# Patient Record
Sex: Female | Born: 1990 | Race: White | Hispanic: No | Marital: Single | Smoking: Never smoker
Health system: Southern US, Community
[De-identification: ages and names within clinical notes are randomized; demographics above are authoritative.]

## PROBLEM LIST (undated history)

## (undated) ENCOUNTER — Emergency Department (HOSPITAL_COMMUNITY): Disposition: A | Discharge status: Home / Self Care | Payer: Medicaid Other

## (undated) DIAGNOSIS — K529 Noninfective gastroenteritis and colitis, unspecified: Secondary | ICD-10-CM

## (undated) DIAGNOSIS — G8929 Other chronic pain: Secondary | ICD-10-CM

## (undated) DIAGNOSIS — Z789 Other specified health status: Secondary | ICD-10-CM

## (undated) DIAGNOSIS — Z3009 Encounter for other general counseling and advice on contraception: Secondary | ICD-10-CM

## (undated) DIAGNOSIS — R109 Unspecified abdominal pain: Secondary | ICD-10-CM

## (undated) DIAGNOSIS — N926 Irregular menstruation, unspecified: Principal | ICD-10-CM

## (undated) HISTORY — PX: NO PAST SURGERIES: SHX2092

## (undated) HISTORY — DX: Irregular menstruation, unspecified: N92.6

## (undated) HISTORY — DX: Encounter for other general counseling and advice on contraception: Z30.09

---
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
## 2014-03-11 ENCOUNTER — Emergency Department (HOSPITAL_COMMUNITY): Payer: Managed Care, Other (non HMO)

## 2014-03-11 ENCOUNTER — Emergency Department (HOSPITAL_COMMUNITY)
Admission: EM | Admit: 2014-03-11 | Discharge: 2014-03-12 | Disposition: A | Discharge status: Home / Self Care | Payer: Managed Care, Other (non HMO) | Attending: Emergency Medicine | Admitting: Emergency Medicine

## 2014-03-11 ENCOUNTER — Encounter (HOSPITAL_COMMUNITY): Payer: Self-pay | Admitting: *Deleted

## 2014-03-11 DIAGNOSIS — R1011 Right upper quadrant pain: Secondary | ICD-10-CM | POA: Insufficient documentation

## 2014-03-11 DIAGNOSIS — Z3202 Encounter for pregnancy test, result negative: Secondary | ICD-10-CM | POA: Insufficient documentation

## 2014-03-11 DIAGNOSIS — R1031 Right lower quadrant pain: Secondary | ICD-10-CM | POA: Diagnosis not present

## 2014-03-11 DIAGNOSIS — R1084 Generalized abdominal pain: Secondary | ICD-10-CM | POA: Diagnosis present

## 2014-03-11 DIAGNOSIS — Z8719 Personal history of other diseases of the digestive system: Secondary | ICD-10-CM | POA: Insufficient documentation

## 2014-03-11 DIAGNOSIS — G8929 Other chronic pain: Secondary | ICD-10-CM

## 2014-03-11 DIAGNOSIS — Z79899 Other long term (current) drug therapy: Secondary | ICD-10-CM | POA: Insufficient documentation

## 2014-03-11 DIAGNOSIS — R109 Unspecified abdominal pain: Secondary | ICD-10-CM

## 2014-03-11 HISTORY — DX: Other chronic pain: G89.29

## 2014-03-11 HISTORY — DX: Noninfective gastroenteritis and colitis, unspecified: K52.9

## 2014-03-11 HISTORY — DX: Unspecified abdominal pain: R10.9

## 2014-03-11 LAB — CBC WITH DIFFERENTIAL/PLATELET
BASOS ABS: 0 10*3/uL (ref 0.0–0.1)
Basophils Relative: 0 % (ref 0–1)
Eosinophils Absolute: 0 10*3/uL (ref 0.0–0.7)
Eosinophils Relative: 0 % (ref 0–5)
HCT: 38.3 % (ref 36.0–46.0)
Hemoglobin: 12.7 g/dL (ref 12.0–15.0)
LYMPHS ABS: 1 10*3/uL (ref 0.7–4.0)
Lymphocytes Relative: 5 % — ABNORMAL LOW (ref 12–46)
MCH: 30.6 pg (ref 26.0–34.0)
MCHC: 33.2 g/dL (ref 30.0–36.0)
MCV: 92.3 fL (ref 78.0–100.0)
Monocytes Absolute: 1.6 10*3/uL — ABNORMAL HIGH (ref 0.1–1.0)
Monocytes Relative: 7 % (ref 3–12)
NEUTROS PCT: 88 % — AB (ref 43–77)
Neutro Abs: 18.9 10*3/uL — ABNORMAL HIGH (ref 1.7–7.7)
PLATELETS: 256 10*3/uL (ref 150–400)
RBC: 4.15 MIL/uL (ref 3.87–5.11)
RDW: 13.1 % (ref 11.5–15.5)
WBC: 21.4 10*3/uL — AB (ref 4.0–10.5)

## 2014-03-11 LAB — COMPREHENSIVE METABOLIC PANEL
ALBUMIN: 4.4 g/dL (ref 3.5–5.2)
ALT: 16 U/L (ref 0–35)
AST: 21 U/L (ref 0–37)
Alkaline Phosphatase: 61 U/L (ref 39–117)
Anion gap: 8 (ref 5–15)
BUN: 6 mg/dL (ref 6–23)
CO2: 22 mmol/L (ref 19–32)
CREATININE: 0.68 mg/dL (ref 0.50–1.10)
Calcium: 8.9 mg/dL (ref 8.4–10.5)
Chloride: 105 mmol/L (ref 96–112)
GFR calc Af Amer: >90 mL/min (ref >90)
Glucose, Bld: 90 mg/dL (ref 70–99)
Potassium: 3.5 mmol/L (ref 3.5–5.1)
SODIUM: 135 mmol/L (ref 135–145)
Total Bilirubin: 1 mg/dL (ref 0.3–1.2)
Total Protein: 7.7 g/dL (ref 6.0–8.3)

## 2014-03-11 LAB — URINALYSIS, ROUTINE W REFLEX MICROSCOPIC
Bilirubin Urine: NEGATIVE
GLUCOSE, UA: NEGATIVE mg/dL
Hgb urine dipstick: NEGATIVE
Ketones, ur: NEGATIVE mg/dL
LEUKOCYTES UA: NEGATIVE
Nitrite: NEGATIVE
PROTEIN: NEGATIVE mg/dL
SPECIFIC GRAVITY, URINE: 1.02 (ref 1.005–1.030)
Urobilinogen, UA: 0.2 mg/dL (ref 0.0–1.0)
pH: 7.5 (ref 5.0–8.0)

## 2014-03-11 LAB — PREGNANCY, URINE: Preg Test, Ur: NEGATIVE

## 2014-03-11 LAB — LIPASE, BLOOD: Lipase: 20 U/L (ref 11–59)

## 2014-03-11 LAB — RAPID STREP SCREEN (MED CTR MEBANE ONLY): STREPTOCOCCUS, GROUP A SCREEN (DIRECT): NEGATIVE

## 2014-03-11 MED ORDER — SODIUM CHLORIDE 0.9 % IV SOLN
INTRAVENOUS | Status: DC
  Administered 2014-03-11: 22:00:00 via INTRAVENOUS

## 2014-03-11 MED ORDER — IOHEXOL 300 MG/ML  SOLN
50.0000 mL | Freq: Once | INTRAMUSCULAR | Status: AC | PRN
  Administered 2014-03-11: 50 mL via ORAL

## 2014-03-11 MED ORDER — ONDANSETRON HCL 4 MG/2ML IJ SOLN
4.0000 mg | INTRAMUSCULAR | Status: DC | PRN
  Administered 2014-03-11: 4 mg via INTRAVENOUS
  Filled 2014-03-11: qty 2

## 2014-03-11 MED ORDER — SODIUM CHLORIDE 0.9 % IV BOLUS (SEPSIS)
1000.0000 mL | Freq: Once | INTRAVENOUS | Status: AC
  Administered 2014-03-12: 1000 mL via INTRAVENOUS

## 2014-03-11 MED ORDER — MORPHINE SULFATE 4 MG/ML IJ SOLN
4.0000 mg | INTRAMUSCULAR | Status: DC | PRN
  Administered 2014-03-11: 4 mg via INTRAVENOUS
  Filled 2014-03-11: qty 1

## 2014-03-11 MED ORDER — IOHEXOL 300 MG/ML  SOLN
100.0000 mL | Freq: Once | INTRAMUSCULAR | Status: AC | PRN
  Administered 2014-03-11: 100 mL via INTRAVENOUS

## 2014-03-11 NOTE — ED Notes (Signed)
Pt states right-sided abdominal pain for months. States pain has been worse for "a month or two" Pt states she thinks she has colitis. Pt has not been referred to GI, per pt. Pt also states sore throat began this morning.

## 2014-03-11 NOTE — ED Provider Notes (Signed)
CSN: [id number]     Arrival date & time 03/11/14  1702 History   First MD Initiated Contact with Patient 03/11/14 2056     Chief Complaint  Patient presents with  . Abdominal Pain     HPI Pt was seen at 2120.  Per pt, c/o gradual onset and persistence of constant generalized abd "pain" for the past 4 years, worse over the past 1 month. Describes the abd pain as "sharp." Pt states she was dx with colitis 4 years ago and she has had the pain since then. Pt states she has been evaluated by her PMD for same and was told to "eat right." Pt has not f/u with a GI MD as recommended by her PMD. Pt also c/o sore throat for the past several hours.  Denies N/V, no diarrhea, no fevers, no back pain, no rash, no CP/SOB, no black or blood in stools, no dysuria/hematuria.       Past Medical History  Diagnosis Date  . Colitis   . Chronic abdominal pain    History reviewed. No pertinent past surgical history.  History  Substance Use Topics  . Smoking status: Never Smoker   . Smokeless tobacco: Not on file  . Alcohol Use: No    Review of Systems ROS: Statement: All systems negative except as marked or noted in the HPI; Constitutional: Negative for fever and chills. ; ; Eyes: Negative for eye pain, redness and discharge. ; ; ENMT: Negative for ear pain, hoarseness, nasal congestion, sinus pressure and +sore throat. ; ; Cardiovascular: Negative for chest pain, palpitations, diaphoresis, dyspnea and peripheral edema. ; ; Respiratory: Negative for cough, wheezing and stridor. ; ; Gastrointestinal: +abd pain. Negative for nausea, vomiting, diarrhea, blood in stool, hematemesis, jaundice and rectal bleeding. . ; ; Genitourinary: Negative for dysuria, flank pain and hematuria. ; ; GYN:  No vaginal bleeding, no vaginal discharge, no vulvar pain. ;; Musculoskeletal: Negative for back pain and neck pain. Negative for swelling and trauma.; ; Skin: Negative for pruritus, rash, abrasions, blisters, bruising and skin  lesion.; ; Neuro: Negative for headache, lightheadedness and neck stiffness. Negative for weakness, altered level of consciousness , altered mental status, extremity weakness, paresthesias, involuntary movement, seizure and syncope.      Allergies  Mineral oil  Home Medications   Prior to Admission medications   Medication Sig Start Date End Date Taking? Authorizing Provider  ciprofloxacin (CIPRO) 500 MG tablet Take 500 mg by mouth 2 (two) times daily. 03/02/14  Yes Historical Provider, MD  [id number] 30-2-10-325 MG CAPS Take 2 capsules by mouth daily as needed (for pain).   Yes Historical Provider, MD   BP 122/83 mmHg  Pulse 110  Temp(Src) 99.2 F (37.3 C) (Oral)  Resp 18  Ht  (1.575 m)  Wt 145 lb (65.772 kg)  BMI 26.51 kg/m2  SpO2 100%  LMP 03/04/2014   Filed Vitals:   03/11/14 1956 03/11/14 2330 03/11/14 2332 03/12/14 0051  BP: 131/86 122/83  103/57  Pulse: 118  110 96  Temp: 99.2 F (37.3 C)   99.8 F (37.7 C)  TempSrc: Oral   Oral  Resp: 18   18  Height:      Weight:      SpO2: 98%  100% 100%    Physical Exam  2125: Physical examination:  Nursing notes reviewed; Vital signs and O2 SAT reviewed;  Constitutional: Well developed, Well nourished, Well hydrated, Uncomfortable appearing.; Head:  Normocephalic, atraumatic; Eyes: EOMI, PERRL, No scleral  icterus; ENMT: Mouth and pharynx normal, Mucous membranes moist. No hoarse voice, no drooling, no stridor.; Neck: Supple, Full range of motion, No lymphadenopathy; Cardiovascular: Regular rate and rhythm, No murmur, rub, or gallop; Respiratory: Breath sounds clear & equal bilaterally, No rales, rhonchi, wheezes.  Speaking full sentences with ease, Normal respiratory effort/excursion; Chest: Nontender, Movement normal; Abdomen: Soft, +very mild RUQ and RLQ tenderness to palp. No rebound or guarding. Nondistended, Normal bowel sounds; Genitourinary: No CVA tenderness; Extremities: Pulses normal, No tenderness, No  edema, No calf edema or asymmetry.; Neuro: AA&Ox3, Major CN grossly intact.  Speech clear. No gross focal motor or sensory deficits in extremities.; Skin: Color normal, Warm, Dry.     ED Course  Procedures     EKG Interpretation None      MDM  MDM Reviewed: nursing note, vitals and previous chart Reviewed previous: labs Interpretation: labs and CT scan     Results for orders placed or performed during the hospital encounter of 03/11/14  Rapid strep screen  Result Value Ref Range   Streptococcus, Group A Screen (Direct) NEGATIVE NEGATIVE  CBC WITH DIFFERENTIAL  Result Value Ref Range   WBC 21.4 (H) 4.0 - 10.5 K/uL   RBC 4.15 3.87 - 5.11 MIL/uL   Hemoglobin 12.7 12.0 - 15.0 g/dL   HCT 16.138.3 09.636.0 - 04.546.0 %   MCV 92.3 78.0 - 100.0 fL   MCH 30.6 26.0 - 34.0 pg   MCHC 33.2 30.0 - 36.0 g/dL   RDW 40.913.1 81.111.5 - 91.415.5 %   Platelets 256 150 - 400 K/uL   Neutrophils Relative % 88 (H) 43 - 77 %   Neutro Abs 18.9 (H) 1.7 - 7.7 K/uL   Lymphocytes Relative 5 (L) 12 - 46 %   Lymphs Abs 1.0 0.7 - 4.0 K/uL   Monocytes Relative 7 3 - 12 %   Monocytes Absolute 1.6 (H) 0.1 - 1.0 K/uL   Eosinophils Relative 0 0 - 5 %   Eosinophils Absolute 0.0 0.0 - 0.7 K/uL   Basophils Relative 0 0 - 1 %   Basophils Absolute 0.0 0.0 - 0.1 K/uL  Comprehensive metabolic panel  Result Value Ref Range   Sodium 135 135 - 145 mmol/L   Potassium 3.5 3.5 - 5.1 mmol/L   Chloride 105 96 - 112 mmol/L   CO2 22 19 - 32 mmol/L   Glucose, Bld 90 70 - 99 mg/dL   BUN 6 6 - 23 mg/dL   Creatinine, Ser 7.820.68 0.50 - 1.10 mg/dL   Calcium 8.9 8.4 - 95.610.5 mg/dL   Total Protein 7.7 6.0 - 8.3 g/dL   Albumin 4.4 3.5 - 5.2 g/dL   AST 21 0 - 37 U/L   ALT 16 0 - 35 U/L   Alkaline Phosphatase 61 39 - 117 U/L   Total Bilirubin 1.0 0.3 - 1.2 mg/dL   GFR calc non Af Amer >90 >90 mL/min   GFR calc Af Amer >90 >90 mL/min   Anion gap 8 5 - 15  Pregnancy, urine  Result Value Ref Range   Preg Test, Ur NEGATIVE NEGATIVE  Urinalysis  with microscopic  Result Value Ref Range   Color, Urine YELLOW YELLOW   APPearance CLEAR CLEAR   Specific Gravity, Urine 1.020 1.005 - 1.030   pH 7.5 5.0 - 8.0   Glucose, UA NEGATIVE NEGATIVE mg/dL   Hgb urine dipstick NEGATIVE NEGATIVE   Bilirubin Urine NEGATIVE NEGATIVE   Ketones, ur NEGATIVE NEGATIVE mg/dL   Protein,  ur NEGATIVE NEGATIVE mg/dL   Urobilinogen, UA 0.2 0.0 - 1.0 mg/dL   Nitrite NEGATIVE NEGATIVE   Leukocytes, UA NEGATIVE NEGATIVE  Lipase, blood  Result Value Ref Range   Lipase 20 11 - 59 U/L  CBC with Differential  Result Value Ref Range   WBC 17.3 (H) 4.0 - 10.5 K/uL   RBC 3.79 (L) 3.87 - 5.11 MIL/uL   Hemoglobin 11.8 (L) 12.0 - 15.0 g/dL   HCT 81.1 (L) 91.4 - 78.2 %   MCV 92.9 78.0 - 100.0 fL   MCH 31.1 26.0 - 34.0 pg   MCHC 33.5 30.0 - 36.0 g/dL   RDW 95.6 21.3 - 08.6 %   Platelets 219 150 - 400 K/uL   Neutrophils Relative % 83 (H) 43 - 77 %   Neutro Abs 14.4 (H) 1.7 - 7.7 K/uL   Lymphocytes Relative 9 (L) 12 - 46 %   Lymphs Abs 1.5 0.7 - 4.0 K/uL   Monocytes Relative 8 3 - 12 %   Monocytes Absolute 1.4 (H) 0.1 - 1.0 K/uL   Eosinophils Relative 0 0 - 5 %   Eosinophils Absolute 0.0 0.0 - 0.7 K/uL   Basophils Relative 0 0 - 1 %   Basophils Absolute 0.0 0.0 - 0.1 K/uL   Ct Abdomen Pelvis W Contrast 03/11/2014   CLINICAL DATA:  Worsening right-sided pain for 2 months.  EXAM: CT ABDOMEN AND PELVIS WITH CONTRAST  TECHNIQUE: Multidetector CT imaging of the abdomen and pelvis was performed using the standard protocol following bolus administration of intravenous contrast.  CONTRAST:  50mL OMNIPAQUE IOHEXOL 300 MG/ML SOLN, OMNIPAQUE IOHEXOL 300 MG/ML SOLN  COMPARISON:  10/04/2010  FINDINGS: Lower chest:  No significant abnormality  Hepatobiliary: There are normal appearances of the liver, gallbladder and bile ducts.  Pancreas: Normal  Spleen: Norm  Adrenals/Urinary Tract: The adrenals and kidneys are normal in appearance. There is no urinary calculus evident.  There is no hydronephrosis or ureteral dilatation. Collecting systems and ureters appear unremarkable.  Stomach/Bowel: The appendix is normal. Stomach, small bowel and colon are normal.  Vascular/Lymphatic: The abdominal aorta is normal in caliber. There is no atherosclerotic calcification. There is no adenopathy in the abdomen or pelvis.  Reproductive: Uterus and ovaries are normal.  Other: There is no ascites. No focal inflammatory changes are evident in the abdomen or pelvis. The changes of colitis observed in the 2012 have resolved.  Musculoskeletal: No significant abnormality  IMPRESSION: No significant abnormality   Electronically Signed   By: Ellery Plunk M.D.   On: 03/11/2014 23:43   Dg Chest 2 View 03/12/2014   CLINICAL DATA:  Right-sided abdominal pain for several months  EXAM: CHEST  2 VIEW  COMPARISON:  None.  FINDINGS: The heart size and mediastinal contours are within normal limits. Both lungs are clear. The visualized skeletal structures are unremarkable.  IMPRESSION: No active cardiopulmonary disease.   Electronically Signed   By: Ellery Plunk M.D.   On: 03/12/2014 00:24    0005:  WBC count elevated, but rest of workup is reassuring and without clear source for this. Will check CXR and repeat CBC after IV NS x1L.    0050:  CXR reassuring. WBC trending downward after IVF. Pt has tol PO well while in the ED without N/V.  No stooling while in the ED.  Abd benign, VSS/afebrile and per pt's baseline per EPIC chart review. Feels better and wants to go home now. Dx and testing d/w pt and  family.  Questions answered.  Verb understanding, agreeable to d/c home with outpt f/u.    Samuel Jester, DO 03/14/14 [phone]

## 2014-03-12 ENCOUNTER — Emergency Department (HOSPITAL_COMMUNITY): Payer: Managed Care, Other (non HMO)

## 2014-03-12 LAB — CBC WITH DIFFERENTIAL/PLATELET
BASOS ABS: 0 10*3/uL (ref 0.0–0.1)
Basophils Relative: 0 % (ref 0–1)
Eosinophils Absolute: 0 10*3/uL (ref 0.0–0.7)
Eosinophils Relative: 0 % (ref 0–5)
HCT: 35.2 % — ABNORMAL LOW (ref 36.0–46.0)
Hemoglobin: 11.8 g/dL — ABNORMAL LOW (ref 12.0–15.0)
LYMPHS PCT: 9 % — AB (ref 12–46)
Lymphs Abs: 1.5 10*3/uL (ref 0.7–4.0)
MCH: 31.1 pg (ref 26.0–34.0)
MCHC: 33.5 g/dL (ref 30.0–36.0)
MCV: 92.9 fL (ref 78.0–100.0)
Monocytes Absolute: 1.4 10*3/uL — ABNORMAL HIGH (ref 0.1–1.0)
Monocytes Relative: 8 % (ref 3–12)
NEUTROS ABS: 14.4 10*3/uL — AB (ref 1.7–7.7)
NEUTROS PCT: 83 % — AB (ref 43–77)
Platelets: 219 10*3/uL (ref 150–400)
RBC: 3.79 MIL/uL — AB (ref 3.87–5.11)
RDW: 13.2 % (ref 11.5–15.5)
WBC: 17.3 10*3/uL — AB (ref 4.0–10.5)

## 2014-03-12 MED ORDER — DICYCLOMINE HCL 20 MG PO TABS: 20.0000 mg | ORAL_TABLET | Freq: Four times a day (QID) | ORAL | Status: DC | PRN

## 2014-03-12 NOTE — Discharge Instructions (Signed)
°Emergency Department Resource Guide °1) Find a Doctor and Pay Out of Pocket °Although you won't have to find out who is covered by your insurance plan, it is a good idea to ask around and get recommendations. You will then need to call the office and see if the doctor you have chosen will accept you as a new patient and what types of options they offer for patients who are self-pay. Some doctors offer discounts or will set up payment plans for their patients who do not have insurance, but you will need to ask so you aren't surprised when you get to your appointment. ° °2) Contact Your Local Health Department °Not all health departments have doctors that can see patients for sick visits, but many do, so it is worth a call to see if yours does. If you don't know where your local health department is, you can check in your phone book. The CDC also has a tool to help you locate your state's health department, and many state websites also have listings of all of their local health departments. ° °3) Find a Walk-in Clinic °If your illness is not likely to be very severe or complicated, you may want to try a walk in clinic. These are popping up all over the country in pharmacies, drugstores, and shopping centers. They're usually staffed by nurse practitioners or physician assistants that have been trained to treat common illnesses and complaints. They're usually fairly quick and inexpensive. However, if you have serious medical issues or chronic medical problems, these are probably not your best option. ° °No Primary Care Doctor: °- Call Health Connect at  832-8000 - they can help you locate a primary care doctor that  accepts your insurance, provides certain services, etc. °- Physician Referral Service- 1-800-533-3463 ° °Chronic Pain Problems: °Organization         Address  Phone   Notes  °[facility]  (336) 297-2271 Patients need to be referred by their primary care doctor.  ° °Medication  Assistance: °Organization         Address  Phone   Notes  °[place] [facility] [address] °[place], [place] [address] (336) 641-8030 --Must be a resident of [place] °-- Must have NO insurance coverage whatsoever (no Medicaid/ Medicare, etc.) °-- The pt. MUST have a primary care doctor that directs their care regularly and follows them in the community °  °[facility]  (866) 331-1348   °[employer]  (888) 892-1162   ° °Agencies that provide inexpensive medical care: °Organization         Address  Phone   Notes  °[facility]  (336) 832-8035   °[facility]    (336) 832-7272   °[facility] [address] °[place], [place] [address] (336) 832-4777   °[facility] [address], °[place] (336) 271-4999   °[facility]    (336) 373-0678   °[facility]    (336) 272-1050   °[facility] ° [address], [place] Phone:  (336) 832-4444, Fax:  (336) 832-4440 Hours of Operation:  9 am - 6 pm, M-F.  Also accepts Medicaid/Medicare and self-pay.  °[facility] ° [address], [place] Phone: (336) [phone], Fax: (336) [phone]. Hours of Operation:  8:30 am - 5:30 pm, M-F.  Also accepts Medicaid and self-pay.  °[facility] [place] [address], [place] Phone: (336) 878-6027   °[facility] [address], [place], [place] (336)723-1848, Ext. 123 Mondays & Thursdays: 7-9 AM.  First 15 patients are seen on a first come, first serve basis. °  ° °Medicaid-accepting [place] Providers: ° °Organization         Address  Phone   Notes  °[facility] [address], [place] (336) 641-2100 Also accepts self-pay patients.  °[facility] [address], [place] ° (336) 856-9996   °[facility] [address], [place]  (336) 288-8857   °[facility] [address] [place] [address], [place] (336) 299-7000   °Veita Bland [address], [place]  ° (336) 373-1557 Only accepts [place] Access Medicaid patients after they have their name applied to their card.  ° °Self-Pay (no insurance) in [place]: ° °Organization         Address  Phone   Notes  °Sickle Cell Patients, [facility] [address], [place] (336) 832-1970   °[facility] [address], [place] (336) 832-4400   °[facility] ° [address], [place] (336) 992-4800   °[facility]/Dr. Osei-Bonsu ° [address] [place] [address], [place] or [address], [place] (336) [phone] Phone number for both [place] and [place] locations is the same.  °[facility] [address], [place] (336) 299-0000   °[facility] [address] [place] [address], [place] or [address] (336) 852-7530 °(336) 878-2260   °[facility] [address], [place] (336) 350-1642, phone; (336) 294-5005, fax Sees patients 1st and 3rd Saturday of every month.  Must not qualify for public or private insurance (i.e. Medicaid, Medicare, [place] Health Choice, Veterans' Benefits) • Household income should be no more than 200% of the poverty level •The clinic cannot treat you if you are pregnant or think you are pregnant • Sexually transmitted diseases are not treated at the clinic.  ° ° °Dental Care: °Organization         Address  Phone  Notes  °[place] [facility] [address], [place] (336) 641-6152 Accepts children up to age 21 who are enrolled in Medicaid or [place] Health Choice; pregnant women with a Medicaid card; and children who have applied for Medicaid or [place] Health Choice, but were declined, whose parents can pay a reduced fee at time of service.  °[place]  [facility] [place]  [address], [place] (336) 641-7733 Accepts children up to age 21 who are enrolled in Medicaid or [place] Health Choice; pregnant women with a Medicaid card; and children who have applied for Medicaid or [place] Health Choice, but were declined, whose parents can pay a reduced fee at time of service.  °[facility] ° [address], [place] (336) 641-4533 Patients are seen by appointment only. Walk-ins are not accepted. [facility] will see patients 18 years of age and older. °Monday - Tuesday (8am-5pm) °Most Wednesdays (8:30-5pm) °$30 per visit, cash only  °[facility] ° [address], [place] (336) 641-4533 Patients are seen by appointment only. Walk-ins are not accepted. [facility] will see patients 18 years of age and older. °One   Wednesday Evening (Monthly: Volunteer Based).  $30 per visit, cash only  °[facility]  (919) 537-3737 for adults; Children under age 4, call Graduate Pediatric Dentistry at ([phone]) [phone]. Children aged 4-14, please call ([phone]) [phone] to request a pediatric application. ° Dental services are provided in all areas of dental care including fillings, crowns and bridges, complete and partial dentures, implants, gum treatment, root canals, and extractions. Preventive care is also provided. Treatment is provided to both adults and children. °Patients are selected via a lottery and there is often a waiting list. °  °[facility] [address], °[place] ° (336) 763-8833 [email or web address] °  °[facility] [address], [place], [place] (336)723-1848, Ext. 123 Second and Fourth Thursday of each month, opens at 6:30 AM; Clinic ends at 9 AM.  Patients are seen on a first-come first-served basis, and a limited number are seen during each clinic.  ° °[facility] ° [address], [place], [place] (336) 723-7904    Eligibility Requirements °You must have lived in [place], [place], or [place] counties for at least the last three months. °  You cannot be eligible for state or federal sponsored healthcare insurance, including Veterans Administration, Medicaid, or Medicare. °  You generally cannot be eligible for healthcare insurance through your employer.  °  How to apply: °Eligibility screenings are held every Tuesday and Wednesday afternoon from 1:00 pm until 4:00 pm. You do not need an appointment for the interview!  °[facility] [address], [place], [place] 336-631-2330   °[facility]  336-342-8273   °[facility]  336-703-3100   °[facility]  336-570-6415   ° °Behavioral Health Resources in the Community: °Intensive Outpatient Programs °Organization         Address  Phone  Notes  °[facility] [address], [place], [place] 336-878-6098   °[facility] [address], [place], [place] 336-832-9800   °[facility] [address], [place], [place] ° 336-882-2125   °[facility] [address],  °[place], [place] 1-800-853-5163 or 336-641-4981   °Substance Abuse Resources °Organization         Address  Phone  Notes  °[facility]  336-882-2125   °[facility]  336-784-9470   °[facility]  336-285-9073   °[facility]  336-845-3988   °Residential & Outpatient Substance Abuse Program  1-800-659-3381   °Psychological Services °Organization         Address  Phone  Notes  °[facility]  336- 832-9600   °[facility]  336- 378-7881   °[facility] [address], [place] 1-800-853-5163 or 336-641-4981   ° °Mobile Crisis Teams °Organization         Address  Phone  Notes  °[facility], Mobile Crisis Care Unit  1-877-626-1772   °Assertive °[facility] ° [address]  [place], [place] 336-834-9664   °Sharon DeEsch [address] °[place] 336-554-5454   ° °Self-Help/Support Groups °Organization         Address  Phone             Notes  °[facility] of  - variety of support groups  336- 373-1402 Call for more information  °Narcotics Anonymous (NA), [facility] [address], °[place]  2 meetings at this location  ° °  Residential Treatment Programs Organization         Address  Phone  Notes  [facility] [address],    [place]  [phone]   [facility]  [address], [place] [phone], [place], [place] [phone]   [facility] [address], [place] [phone] Admissions: 8am-3pm M-F  [facility] [address]    [place], [place] [phone]   [facility] [address], [place], [place] [phone]   [facility] [address]  [place], [place] [phone]   [facility] Intensive Outpatient [address], [place], [place] [phone]   [facility] ([facility] [address]  [place], [place] [phone] or [phone]   [facility] ([facility] [address] [place], [place] [phone] Accepts Medicaid  [facility] [address]  [place] [phone])[phone] Substance Abuse/Addiction Treatment   [facility] Organization         Address  Phone  Notes  [facility]  531-490-7574(888) [phone]   Angie FavaJulie Brannon, PhD [address] [place], [place]   [phone](336) [phone] or ([phone](336) [phone]   [facility]   [address] [place], [place] [phone](336) [phone]   [facility] [address], [place], [place] [phone]) [phone](336) [phone] Insurance/Medicaid/sponsorship through [facility] [address]                                    [place], [place] [phone]) [phone] Therapy/tele-psych/case    [facility] [address]   [place], [place] [phone](336) [phone]    Dr. Lolly MustacheArfeen  [phone](336) [phone]   [facility]  [employer] [facility] 1) [address], [place] 2) [address], [place] 3)  [address] [address], [place] (336)[phone](336) [phone](336) [phone]  [phone](336) [phone]   [facility] Child Abuse Hotline 215-108-1651(336) 443-472-4085 or [phone](336) [phone] (After Hours)      Take the prescription as directed.  Call your regular medical doctor today to schedule a follow up appointment within the next 2 days. Call the GI doctor today to schedule a follow up appointment within the next week.  Return to the Emergency Department immediately sooner if worsening.

## 2014-03-12 NOTE — ED Notes (Signed)
Pt given water to drink. 

## 2014-03-14 LAB — CULTURE, GROUP A STREP: STREP A CULTURE: NEGATIVE

## 2014-04-01 ENCOUNTER — Encounter (INDEPENDENT_AMBULATORY_CARE_PROVIDER_SITE_OTHER): Payer: Self-pay | Admitting: *Deleted

## 2014-04-22 ENCOUNTER — Encounter (INDEPENDENT_AMBULATORY_CARE_PROVIDER_SITE_OTHER): Payer: Self-pay | Admitting: Internal Medicine

## 2014-04-22 ENCOUNTER — Ambulatory Visit (INDEPENDENT_AMBULATORY_CARE_PROVIDER_SITE_OTHER): Payer: Managed Care, Other (non HMO) | Admitting: Internal Medicine

## 2014-04-22 VITALS — BP 90/60 | HR 76 | Temp 98.1°F | Ht 64.0 in | Wt 144.0 lb

## 2014-04-22 DIAGNOSIS — R109 Unspecified abdominal pain: Secondary | ICD-10-CM | POA: Insufficient documentation

## 2014-04-22 DIAGNOSIS — R1030 Lower abdominal pain, unspecified: Secondary | ICD-10-CM

## 2014-04-22 LAB — CBC WITH DIFFERENTIAL/PLATELET
BASOS ABS: 0 10*3/uL (ref 0.0–0.1)
Basophils Relative: 0 % (ref 0–1)
EOS ABS: 0.3 10*3/uL (ref 0.0–0.7)
Eosinophils Relative: 4 % (ref 0–5)
HCT: 40.8 % (ref 36.0–46.0)
Hemoglobin: 13.7 g/dL (ref 12.0–15.0)
LYMPHS PCT: 25 % (ref 12–46)
Lymphs Abs: 2 10*3/uL (ref 0.7–4.0)
MCH: 30.5 pg (ref 26.0–34.0)
MCHC: 33.6 g/dL (ref 30.0–36.0)
MCV: 90.9 fL (ref 78.0–100.0)
MPV: 10.6 fL (ref 8.6–12.4)
Monocytes Absolute: 1.1 10*3/uL — ABNORMAL HIGH (ref 0.1–1.0)
Monocytes Relative: 14 % — ABNORMAL HIGH (ref 3–12)
Neutro Abs: 4.4 10*3/uL (ref 1.7–7.7)
Neutrophils Relative %: 57 % (ref 43–77)
Platelets: 343 10*3/uL (ref 150–400)
RBC: 4.49 MIL/uL (ref 3.87–5.11)
RDW: 13.7 % (ref 11.5–15.5)
WBC: 7.8 10*3/uL (ref 4.0–10.5)

## 2014-04-22 LAB — C-REACTIVE PROTEIN: CRP: <0.5 mg/dL (ref <0.60)

## 2014-04-22 NOTE — Progress Notes (Signed)
Subjective:    Patient ID: [name] Brown, female    DOB: 12/22/1990, 24 y.o.   MRN: [id number]  HPI 73 one year old female referred to our office by Terie Purser, PAC, for abdominal pain.  The pain is located in her rt lower abdomen.  She has had the pain for a couple of months.  There is no pain today. No fever associated with the pain.  She has this pain about once a week.  She usually takes nothing for the pain. She has been seen in the ED for this. Last visit was in February and was given Bentyl which she is not taking. Appetite is good.  No weight loss. She usually has a BM once a day. She saw blood on the tissue 2 weeks ago after having a BM. Stools are usually brown in color.  No hx of UC or Crohn's in her family. Last gyn exam 2015 and was normal at [facility].  There is no pain today.    LMP 04/05/2014. Sexually active. No birthcontrol.   03/10/2014 CT abdomen/pelvis with CM: Rt sided abdominal pain x 2 months  IMPRESSION: No significant abnormality     10/04/2010  Ct abdomen/pelvis with CM: IMPRESSION:  1.  Probable right colonic wall thickening, consistent with right- sided colitis.  Shotty right lower quadrant mesenteric lymph nodes and tiny amount of free fluid are likely reactive in etiology. 2.  No evidence of appendicitis.  No other significant abnormality identified.   CBC Latest Ref Rng 03/12/2014 03/11/2014 08/28/2011  WBC 4.0 - 10.5 K/uL 17.3(H) 21.4(H) 8.8  Hemoglobin 12.0 - 15.0 g/dL 11.8(L) 12.7 13.8  Hematocrit 36.0 - 46.0 % 35.2(L) 38.3 39.8  Platelets 150 - 400 K/uL 219 256 292        CBC    Component Value Date/Time   WBC 17.3* 03/12/2014 0034   RBC 3.79* 03/12/2014 0034   HGB 11.8* 03/12/2014 0034   HCT 35.2* 03/12/2014 0034   PLT 219 03/12/2014 0034   MCV 92.9 03/12/2014 0034   MCH 31.1 03/12/2014 0034   MCHC 33.5 03/12/2014 0034   RDW 13.2 03/12/2014 0034   LYMPHSABS 1.5 03/12/2014 0034   MONOABS 1.4* 03/12/2014 0034   EOSABS 0.0  03/12/2014 0034   BASOSABS 0.0 03/12/2014 0034   Hepatic Function Panel     Component Value Date/Time   PROT 7.7 03/11/2014 2026   ALBUMIN 4.4 03/11/2014 2026   AST 21 03/11/2014 2026   ALT 16 03/11/2014 2026   ALKPHOS 61 03/11/2014 2026   BILITOT 1.0 03/11/2014 2026   BILIDIR 0.2 12/29/2007 0420   IBILI 0.4 12/29/2007 0420    Urinalysis    Component Value Date/Time   COLORURINE YELLOW 03/11/2014 1817   APPEARANCEUR CLEAR 03/11/2014 1817   LABSPEC 1.020 03/11/2014 1817   PHURINE 7.5 03/11/2014 1817   GLUCOSEU NEGATIVE 03/11/2014 1817   HGBUR NEGATIVE 03/11/2014 1817   BILIRUBINUR NEGATIVE 03/11/2014 1817   KETONESUR NEGATIVE 03/11/2014 1817   PROTEINUR NEGATIVE 03/11/2014 1817   UROBILINOGEN 0.2 03/11/2014 1817   NITRITE NEGATIVE 03/11/2014 1817   LEUKOCYTESUR NEGATIVE 03/11/2014 1817   Lipase     Component Value Date/Time   LIPASE 20 03/11/2014 2059          Review of Systems Single, No children. Works at [employer].  Past Medical History  Diagnosis Date  . Colitis   . Chronic abdominal pain     No past surgical history on file.  Allergies  Allergen Reactions  .  Mineral Oil Rash    Baby oil    No current outpatient prescriptions on file prior to visit.   No current facility-administered medications on file prior to visit.        Objective:   Physical Exam Blood pressure 90/60, pulse 76, temperature 98.1 F (36.7 C), height 5\' 4"  (1.626 m), weight 144 lb (65.318 kg). Alert and oriented. Skin warm and dry. Oral mucosa is moist.   . Sclera anicteric, conjunctivae is pink. Thyroid not enlarged. No cervical lymphadenopathy. Lungs clear. Heart regular rate and rhythm.  Abdomen is soft. Bowel sounds are positive. No hepatomegaly. No abdominal masses felt. Slight tenderness rt lower abdomen.  No edema to lower extremities.  Rectal exam: no stool in rectum.      Assessment & Plan:  Rt lower quadrant pain ? Etiology. She has has this pain for several  years off an on . Am going to get a CBC and CRP, sed rate. I discussed with Dr. Karilyn Cotaehman. Once back I will review labs.. ?colonoscopy  I am going to send a hemocult card home with patient.

## 2014-04-22 NOTE — Patient Instructions (Addendum)
CBC, CRP, sedrate

## 2014-04-23 ENCOUNTER — Encounter (INDEPENDENT_AMBULATORY_CARE_PROVIDER_SITE_OTHER): Payer: Self-pay

## 2014-04-23 LAB — SEDIMENTATION RATE: Sed Rate: 1 mm/hr (ref 0–20)

## 2014-04-29 ENCOUNTER — Telehealth (INDEPENDENT_AMBULATORY_CARE_PROVIDER_SITE_OTHER): Payer: Self-pay | Admitting: *Deleted

## 2014-04-29 NOTE — Telephone Encounter (Signed)
[name] Brown, Patient needs OV on a Monday or Tuesday while Dr. Karilyn Cotaehman in the office in 4 weeks.

## 2014-04-29 NOTE — Telephone Encounter (Signed)
   Diagnosis:    Result(s)   Card 1:Negative:            Completed by: Larose Hiresammy Shirlee Whitmire, LPN   HEMOCCULT SENSA DEVELOPER: LOT#:  [id number] EXPIRATION DATE: 9-17   HEMOCCULT SENSA CARD:  LOT#:  02/[id number] EXPIRATION DATE: 07-18   CARD CONTROL RESULTS:  POSITIVE: Positive NEGATIVE: Negative    ADDITIONAL COMMENTS: Result forwarded to Terri to review.

## 2014-04-30 NOTE — Telephone Encounter (Signed)
LM for patient to return the call at [phone]. 

## 2014-05-01 NOTE — Telephone Encounter (Signed)
Apt has been scheduled for 06/01/14 with Terri while Dr. Karilyn Cotaehman is in office.

## 2014-05-04 ENCOUNTER — Telehealth (INDEPENDENT_AMBULATORY_CARE_PROVIDER_SITE_OTHER): Payer: Self-pay | Admitting: *Deleted

## 2014-05-04 NOTE — Telephone Encounter (Signed)
[name] Brown would like a call back about her labs and next step after speaking with Dr. Karilyn Cotaehman. Her return phone number is [phone].

## 2014-05-05 ENCOUNTER — Encounter (INDEPENDENT_AMBULATORY_CARE_PROVIDER_SITE_OTHER): Payer: Self-pay | Admitting: Internal Medicine

## 2014-05-05 NOTE — Telephone Encounter (Signed)
I have spoken with patient. Needs OV in 3 weeks on a M or T while he is in office. I discussed this case with Dr Karilyn Cotaehman

## 2014-05-05 NOTE — Telephone Encounter (Signed)
Apt has been scheduled for 05/19/14 at 9:30 am with Dorene Arerri Setzer, NP.

## 2014-05-19 ENCOUNTER — Ambulatory Visit (INDEPENDENT_AMBULATORY_CARE_PROVIDER_SITE_OTHER): Payer: Managed Care, Other (non HMO) | Admitting: Internal Medicine

## 2014-06-01 ENCOUNTER — Ambulatory Visit (INDEPENDENT_AMBULATORY_CARE_PROVIDER_SITE_OTHER): Payer: Managed Care, Other (non HMO) | Admitting: Internal Medicine

## 2014-06-20 ENCOUNTER — Emergency Department (HOSPITAL_COMMUNITY)
Admission: EM | Admit: 2014-06-20 | Discharge: 2014-06-20 | Disposition: A | Discharge status: Home / Self Care | Payer: Managed Care, Other (non HMO)

## 2014-06-26 ENCOUNTER — Encounter: Payer: Self-pay | Admitting: Adult Health

## 2014-06-26 ENCOUNTER — Ambulatory Visit (INDEPENDENT_AMBULATORY_CARE_PROVIDER_SITE_OTHER): Payer: Commercial Indemnity | Admitting: Adult Health

## 2014-06-26 VITALS — BP 100/60 | HR 72 | Ht 64.0 in | Wt 147.5 lb

## 2014-06-26 DIAGNOSIS — Z3202 Encounter for pregnancy test, result negative: Secondary | ICD-10-CM | POA: Diagnosis not present

## 2014-06-26 DIAGNOSIS — N926 Irregular menstruation, unspecified: Secondary | ICD-10-CM | POA: Diagnosis not present

## 2014-06-26 DIAGNOSIS — Z3009 Encounter for other general counseling and advice on contraception: Secondary | ICD-10-CM | POA: Insufficient documentation

## 2014-06-26 DIAGNOSIS — Z309 Encounter for contraceptive management, unspecified: Secondary | ICD-10-CM

## 2014-06-26 HISTORY — DX: Irregular menstruation, unspecified: N92.6

## 2014-06-26 HISTORY — DX: Encounter for other general counseling and advice on contraception: Z30.09

## 2014-06-26 LAB — POCT URINE PREGNANCY: Preg Test, Ur: NEGATIVE

## 2014-06-26 NOTE — Patient Instructions (Signed)
Oral Contraception Use Oral contraceptive pills (OCPs) are medicines taken to prevent pregnancy. OCPs work by preventing the ovaries from releasing eggs. The hormones in OCPs also cause the cervical mucus to thicken, preventing the sperm from entering the uterus. The hormones also cause the uterine lining to become thin, not allowing a fertilized egg to attach to the inside of the uterus. OCPs are highly effective when taken exactly as prescribed. However, OCPs do not prevent sexually transmitted diseases (STDs). Safe sex practices, such as using condoms along with an OCP, can help prevent STDs. Before taking OCPs, you may have a physical exam and Pap test. Your health care provider may also order blood tests if necessary. Your health care provider will make sure you are a good candidate for oral contraception. Discuss with your health care provider the possible side effects of the OCP you may be prescribed. When starting an OCP, it can take 2 to 3 months for the body to adjust to the changes in hormone levels in your body.  HOW TO TAKE ORAL CONTRACEPTIVE PILLS Your health care provider may advise you on how to start taking the first cycle of OCPs. Otherwise, you can:   Start on day 1 of your menstrual period. You will not need any backup contraceptive protection with this start time.   Start on the first Sunday after your menstrual period or the day you get your prescription. In these cases, you will need to use backup contraceptive protection for the first week.   Start the pill at any time of your cycle. If you take the pill within 5 days of the start of your period, you are protected against pregnancy right away. In this case, you will not need a backup form of birth control. If you start at any other time of your menstrual cycle, you will need to use another form of birth control for 7 days. If your OCP is the type called a minipill, it will protect you from pregnancy after taking it for 2 days (48  hours). After you have started taking OCPs:   If you forget to take 1 pill, take it as soon as you remember. Take the next pill at the regular time.   If you miss 2 or more pills, call your health care provider because different pills have different instructions for missed doses. Use backup birth control until your next menstrual period starts.   If you use a 28-day pack that contains inactive pills and you miss 1 of the last 7 pills (pills with no hormones), it will not matter. Throw away the rest of the non-hormone pills and start a new pill pack.  No matter which day you start the OCP, you will always start a new pack on that same day of the week. Have an extra pack of OCPs and a backup contraceptive method available in case you miss some pills or lose your OCP pack.  HOME CARE INSTRUCTIONS   Do not smoke.   Always use a condom to protect against STDs. OCPs do not protect against STDs.   Use a calendar to mark your menstrual period days.   Read the information and directions that came with your OCP. Talk to your health care provider if you have questions.  SEEK MEDICAL CARE IF:   You develop nausea and vomiting.   You have abnormal vaginal discharge or bleeding.   You develop a rash.   You miss your menstrual period.   You are losing   your hair.   You need treatment for mood swings or depression.   You get dizzy when taking the OCP.   You develop acne from taking the OCP.   You become pregnant.  SEEK IMMEDIATE MEDICAL CARE IF:   You develop chest pain.   You develop shortness of breath.   You have an uncontrolled or severe headache.   You develop numbness or slurred speech.   You develop visual problems.   You develop pain, redness, and swelling in the legs.  Document Released: 12/29/2010 Document Revised: 05/26/2013 Document Reviewed: 06/30/2012 [facility] Patient Information 2015 [employer] [place]. This information is not intended to replace  advice given to you by your health care provider. Make sure you discuss any questions you have with your health care provider. Will talk Monday, if [facility] negative will start lo loestrin Monday.

## 2014-06-26 NOTE — Progress Notes (Signed)
Subjective:     Patient ID: [name] Brown, female   DOB: 06/05/1990, 24 y.o.   MRN: [id number]  HPI [name] Brown is a [age] year old white female,single,G0P0, new to this practice, in complaining of having missed a period, she is 5 days late and has never done this before.She has new partner and uses condoms some.She wants to get on birth control.She has used OCs for short period in past and also depo but gained weight, thinks she wants low dose pill. [facility] and she said she had pap there earlier this year.  Review of Systems Patient denies any headaches, hearing loss, fatigue, blurred vision, shortness of breath, chest pain, problems with bowel movements, urination, or intercourse. No joint pain or mood swings.+missed period and she has chronic abdominal pain, has seen GI.  Reviewed past medical,surgical, social and family history. Reviewed medications and allergies.     Objective:   Physical Exam BP 100/60 mmHg  Pulse 72  Ht 5\' 4"  (1.626 m)  Wt 147 lb 8 oz (66.906 kg)  BMI 25.31 kg/m2  LMP 05/21/2014 UPT negative, Skin warm and dry.Pelvic: external genitalia is normal in appearance no lesions, vagina: pink discharge without odor,urethra has no lesions or masses noted, cervix:smooth,no CMT, uterus: normal size, shape and contour, non tender, no masses felt, adnexa: no masses or tenderness noted. Bladder is non tender and no masses felt. Abdomen soft, non tender, no HSM noted. GC/CHL obtained.    Discussed that she may be getting ready to start period,and that can start lo loestrin Monday [facility] and this pink discharge is start of her period.  Assessment:     Missed period Contraceptive counseling     Plan:    Encouraged to use condoms 100% Check QHCG, will talk Monday if negative will Rx Lo loestrin to start then GC/CHL sent Review handout on OC use   Follow up in 3 months

## 2014-06-27 LAB — BETA HCG QUANT (REF LAB): HCG QUANT: 2 m[IU]/mL

## 2014-06-28 LAB — GC/CHLAMYDIA PROBE AMP
Chlamydia trachomatis, NAA: NEGATIVE
Neisseria gonorrhoeae by PCR: NEGATIVE

## 2014-06-29 ENCOUNTER — Telehealth: Payer: Self-pay | Admitting: Adult Health

## 2014-06-29 NOTE — Telephone Encounter (Signed)
Left message to call.

## 2014-06-30 ENCOUNTER — Telehealth: Payer: Self-pay | Admitting: Adult Health

## 2014-06-30 DIAGNOSIS — N926 Irregular menstruation, unspecified: Secondary | ICD-10-CM

## 2014-06-30 NOTE — Telephone Encounter (Signed)
Left message to call about labs 

## 2014-06-30 NOTE — Telephone Encounter (Signed)
Pt aware of labs check [facility]

## 2014-07-01 ENCOUNTER — Telehealth: Payer: Self-pay | Admitting: Adult Health

## 2014-07-01 LAB — BETA HCG QUANT (REF LAB)

## 2014-07-01 MED ORDER — NORETHIN-ETH ESTRAD-FE BIPHAS 1 MG-10 MCG / 10 MCG PO TABS: 1.0000 | ORAL_TABLET | Freq: Every day | ORAL | Status: DC

## 2014-07-01 NOTE — Telephone Encounter (Signed)
Pt aware QHCG<1 wil rx lo loestrin and can start today

## 2014-09-29 ENCOUNTER — Ambulatory Visit: Payer: Commercial Indemnity | Admitting: Adult Health

## 2014-12-22 ENCOUNTER — Ambulatory Visit: Payer: Commercial Indemnity | Admitting: Adult Health

## 2014-12-22 ENCOUNTER — Ambulatory Visit (INDEPENDENT_AMBULATORY_CARE_PROVIDER_SITE_OTHER): Payer: Managed Care, Other (non HMO) | Admitting: Advanced Practice Midwife

## 2014-12-22 ENCOUNTER — Encounter: Payer: Self-pay | Admitting: Advanced Practice Midwife

## 2014-12-22 VITALS — BP 122/70 | HR 76 | Ht 62.0 in | Wt 145.5 lb

## 2014-12-22 DIAGNOSIS — A5901 Trichomonal vulvovaginitis: Secondary | ICD-10-CM | POA: Diagnosis not present

## 2014-12-22 DIAGNOSIS — B379 Candidiasis, unspecified: Secondary | ICD-10-CM

## 2014-12-22 MED ORDER — FLUCONAZOLE 150 MG PO TABS: ORAL_TABLET | ORAL | Status: DC

## 2014-12-22 NOTE — Patient Instructions (Signed)
Trichomoniasis Trichomoniasis is an infection caused by an organism called Trichomonas. The infection can affect both women and men. In women, the outer female genitalia and the vagina are affected. In men, the penis is mainly affected, but the prostate and other reproductive organs can also be involved. Trichomoniasis is a sexually transmitted infection (STI) and is most often passed to another person through sexual contact.  RISK FACTORS  Having unprotected sexual intercourse.  Having sexual intercourse with an infected partner. SIGNS AND SYMPTOMS  Symptoms of trichomoniasis in women include:  Abnormal gray-green frothy vaginal discharge.  Itching and irritation of the vagina.  Itching and irritation of the area outside the vagina. Symptoms of trichomoniasis in men include:   Penile discharge with or without pain.  Pain during urination. This results from inflammation of the urethra. DIAGNOSIS  Trichomoniasis may be found during a Pap test or physical exam. Your health care provider may use one of the following methods to help diagnose this infection:  Testing the pH of the vagina with a test tape.  Using a vaginal swab test that checks for the Trichomonas organism. A test is available that provides results within a few minutes.  Examining a urine sample.  Testing vaginal secretions. Your health care provider may test you for other STIs, including HIV. TREATMENT   You may be given medicine to fight the infection. Women should inform their health care provider if they could be or are pregnant. Some medicines used to treat the infection should not be taken during pregnancy.  Your health care provider may recommend over-the-counter medicines or creams to decrease itching or irritation.  Your sexual partner will need to be treated if infected.  Your health care provider may test you for infection again 3 months after treatment. HOME CARE INSTRUCTIONS   Take medicines only as  directed by your health care provider.  Take over-the-counter medicine for itching or irritation as directed by your health care provider.  Do not have sexual intercourse while you have the infection.  Women should not douche or wear tampons while they have the infection.  Discuss your infection with your partner. Your partner may have gotten the infection from you, or you may have gotten it from your partner.  Have your sex partner get examined and treated if necessary.  Practice safe, informed, and protected sex.  See your health care provider for other STI testing. SEEK MEDICAL CARE IF:   You still have symptoms after you finish your medicine.  You develop abdominal pain.  You have pain when you urinate.  You have bleeding after sexual intercourse.  You develop a rash.  Your medicine makes you sick or makes you throw up (vomit). MAKE SURE YOU:  Understand these instructions.  Will watch your condition.  Will get help right away if you are not doing well or get worse.   This information is not intended to replace advice given to you by your health care provider. Make sure you discuss any questions you have with your health care provider.   Document Released: 07/05/2000 Document Revised: 01/30/2014 Document Reviewed: 10/21/2012 Elsevier Interactive Patient Education 2016 Elsevier Inc.  

## 2014-12-22 NOTE — Progress Notes (Signed)
   [facility] Visit  Patient name: [name] Brown Royce MRN [id number]  Date of birth: 12/21/1990  CC & HPI:  [name] Brown Beber is a 24 y.o. Caucasian female presenting today for vaginal itch/irritation. She went to Urgent Care last weeks, was tx'd for PID with rocephin and doxycycline and flagy.  Still symptomatic  Pertinent History Reviewed:  Medical & Surgical Hx:   Past Medical History  Diagnosis Date  . Colitis   . Chronic abdominal pain   . Missed period 06/26/2014  . Counseling for birth control, oral contraceptives 06/26/2014   History reviewed. No pertinent past surgical history. Family History  Problem Relation Age of Onset  . Cancer Mother     cervical  . Alcohol abuse Mother   . Other Father     fatty liver  . Cancer Maternal Grandmother     skin  . Dementia Maternal Grandmother   . Dementia Paternal Grandmother   . Diabetes Paternal Grandfather   . Dementia Paternal Grandfather     Current outpatient prescriptions:  .  doxycycline (VIBRA-TABS) 100 MG tablet, 100 mg 2 (two) times daily. , Disp: , Rfl:  .  metroNIDAZOLE (FLAGYL) 500 MG tablet, 500 mg 2 (two) times daily. , Disp: , Rfl:  Social History: Reviewed -  reports that she has never smoked. She has never used smokeless tobacco.  Review of Systems:   Constitutional: Negative for fever and chills Eyes: Negative for visual disturbances Respiratory: Negative for shortness of breath, dyspnea Cardiovascular: Negative for chest pain or palpitations  Gastrointestinal: Negative for vomiting, diarrhea and constipation; no abdominal pain Genitourinary: Negative for dysuria and urgency, vaginal irritation or itching Musculoskeletal: Negative for back pain, joint pain, myalgias  Neurological: Negative for dizziness and headaches    Objective Findings:    Physical Examination: General appearance - well appearing, and in no distress Mental status - alert, oriented to person, place, and time Chest:  Normal  respiratory effort Heart - normal rate and regular rhythm Abdomen:  Soft, nontender Pelvic: SSE;  Yellow frothy dc with some white plaques on sidewalls.  Wet prep + trich, WBC, some yeast Musculoskeletal:  Normal range of motion without pain Extremities:  No edema    No results found for this or any previous visit (from the past 24 hour(s)).    Assessment & Plan:  A: trich/yeast    Finish flagyl.  No sex until boyfriend ttreated  Rx diflucan.    No Follow-up on file.  CRESENZO-DISHMAN,Zekiah Coen CNM 12/22/2014 3:09 PM

## 2014-12-31 ENCOUNTER — Encounter: Payer: Self-pay | Admitting: Obstetrics & Gynecology

## 2014-12-31 ENCOUNTER — Ambulatory Visit (INDEPENDENT_AMBULATORY_CARE_PROVIDER_SITE_OTHER): Payer: Managed Care, Other (non HMO) | Admitting: Obstetrics & Gynecology

## 2014-12-31 VITALS — BP 110/70 | HR 72 | Wt 145.0 lb

## 2014-12-31 DIAGNOSIS — N760 Acute vaginitis: Secondary | ICD-10-CM | POA: Diagnosis not present

## 2014-12-31 MED ORDER — METRONIDAZOLE 500 MG PO TABS: 500.0000 mg | ORAL_TABLET | Freq: Two times a day (BID) | ORAL | Status: DC

## 2014-12-31 MED ORDER — METRONIDAZOLE 0.75 % VA GEL: VAGINAL | Status: DC

## 2014-12-31 NOTE — Progress Notes (Signed)
Patient ID: [name] Brown, female   DOB: 05/19/1990, 24 y.o.   MRN: [id number]      Chief Complaint  Patient presents with  . Follow-up    proof of cure.    Blood pressure 110/70, pulse 72, weight 145 lb (65.772 kg), last menstrual period 12/05/2014.  24 y.o. G0P0000 Patient's last menstrual period was 12/05/2014. The current method of family planning is none.  Subjective Pt had + trichomonas treated still has bothersome discharge No odor some irritation  Objective NEFG Vagina pink moist with grey discharge, wet prep done Cervix no lesions  Pertinent ROS No burning with urination, frequency or urgency No nausea, vomiting or diarrhea Nor fever chills or other constitutional symptoms   Labs or studies Wet Prep:   A sample of vaginal discharge was obtained from the posterior fornix using a cotton swab. 2 drops of saline were placed on a slide and the cotton swab was immersed in the saline. Microscopic evaluation was performed and results were as follows:  Negative  for yeast  Negative for clue cells , consistent with Bacterial vaginosis Negative for trichomonas  Abnormal- very large WBC population   Whiff test: Negative     Impression Diagnoses this Encounter::   ICD-9-CM ICD-10-CM   1. Vaginitis 616.10 N76.0     Established relevant diagnosis(es): Recent history of trichomonas  Plan/Recommendations: Meds ordered this encounter  Medications  . metroNIDAZOLE (FLAGYL) 500 MG tablet    Sig: Take 1 tablet (500 mg total) by mouth 2 (two) times daily.    Dispense:  14 tablet    Refill:  0  . metroNIDAZOLE (METROGEL VAGINAL) 0.75 % vaginal gel    Sig: Nightly x 5 nights    Dispense:  70 g    Refill:  0    Labs or Scans Ordered: No orders of the defined types were placed in this encounter.    Management:: qohs for 10, oral flagyl for boyfriend  Follow up 1  months   All questions were answered.

## 2015-01-01 ENCOUNTER — Ambulatory Visit: Payer: Managed Care, Other (non HMO) | Admitting: Obstetrics & Gynecology

## 2015-01-04 ENCOUNTER — Ambulatory Visit: Payer: Managed Care, Other (non HMO) | Admitting: Women's Health

## 2015-01-26 ENCOUNTER — Emergency Department (HOSPITAL_COMMUNITY): Payer: 59

## 2015-01-26 ENCOUNTER — Encounter (HOSPITAL_COMMUNITY): Payer: Self-pay | Admitting: Emergency Medicine

## 2015-01-26 ENCOUNTER — Emergency Department (HOSPITAL_COMMUNITY)
Admission: EM | Admit: 2015-01-26 | Discharge: 2015-01-26 | Disposition: A | Discharge status: Home / Self Care | Payer: 59 | Attending: Emergency Medicine | Admitting: Emergency Medicine

## 2015-01-26 DIAGNOSIS — Z79899 Other long term (current) drug therapy: Secondary | ICD-10-CM | POA: Insufficient documentation

## 2015-01-26 DIAGNOSIS — Z8742 Personal history of other diseases of the female genital tract: Secondary | ICD-10-CM | POA: Insufficient documentation

## 2015-01-26 DIAGNOSIS — Z3202 Encounter for pregnancy test, result negative: Secondary | ICD-10-CM | POA: Insufficient documentation

## 2015-01-26 DIAGNOSIS — G8929 Other chronic pain: Secondary | ICD-10-CM | POA: Diagnosis not present

## 2015-01-26 DIAGNOSIS — R197 Diarrhea, unspecified: Secondary | ICD-10-CM | POA: Insufficient documentation

## 2015-01-26 DIAGNOSIS — R1084 Generalized abdominal pain: Secondary | ICD-10-CM | POA: Insufficient documentation

## 2015-01-26 DIAGNOSIS — R112 Nausea with vomiting, unspecified: Secondary | ICD-10-CM | POA: Insufficient documentation

## 2015-01-26 DIAGNOSIS — Z8719 Personal history of other diseases of the digestive system: Secondary | ICD-10-CM | POA: Insufficient documentation

## 2015-01-26 LAB — URINALYSIS, ROUTINE W REFLEX MICROSCOPIC
BILIRUBIN URINE: NEGATIVE
Glucose, UA: NEGATIVE mg/dL
Hgb urine dipstick: NEGATIVE
KETONES UR: 15 mg/dL — AB
Leukocytes, UA: NEGATIVE
NITRITE: NEGATIVE
Protein, ur: NEGATIVE mg/dL
Specific Gravity, Urine: 1.025 (ref 1.005–1.030)
pH: 5.5 (ref 5.0–8.0)

## 2015-01-26 LAB — COMPREHENSIVE METABOLIC PANEL
ALK PHOS: 60 U/L (ref 38–126)
ALT: 23 U/L (ref 14–54)
ANION GAP: 9 (ref 5–15)
AST: 21 U/L (ref 15–41)
Albumin: 4.6 g/dL (ref 3.5–5.0)
BILIRUBIN TOTAL: 0.8 mg/dL (ref 0.3–1.2)
BUN: 13 mg/dL (ref 6–20)
CALCIUM: 9.3 mg/dL (ref 8.9–10.3)
CO2: 23 mmol/L (ref 22–32)
Chloride: 106 mmol/L (ref 101–111)
Creatinine, Ser: 0.69 mg/dL (ref 0.44–1.00)
GFR calc non Af Amer: >60 mL/min (ref >60)
Glucose, Bld: 95 mg/dL (ref 65–99)
Potassium: 4.1 mmol/L (ref 3.5–5.1)
Sodium: 138 mmol/L (ref 135–145)
Total Protein: 8.1 g/dL (ref 6.5–8.1)

## 2015-01-26 LAB — CBC WITH DIFFERENTIAL/PLATELET
Basophils Absolute: 0 10*3/uL (ref 0.0–0.1)
Basophils Relative: 0 %
EOS ABS: 0.3 10*3/uL (ref 0.0–0.7)
Eosinophils Relative: 2 %
HEMATOCRIT: 44.5 % (ref 36.0–46.0)
HEMOGLOBIN: 15.4 g/dL — AB (ref 12.0–15.0)
LYMPHS ABS: 1 10*3/uL (ref 0.7–4.0)
Lymphocytes Relative: 6 %
MCH: 31.8 pg (ref 26.0–34.0)
MCHC: 34.6 g/dL (ref 30.0–36.0)
MCV: 91.8 fL (ref 78.0–100.0)
MONOS PCT: 7 %
Monocytes Absolute: 1.1 10*3/uL — ABNORMAL HIGH (ref 0.1–1.0)
NEUTROS PCT: 85 %
Neutro Abs: 14.6 10*3/uL — ABNORMAL HIGH (ref 1.7–7.7)
Platelets: 272 10*3/uL (ref 150–400)
RBC: 4.85 MIL/uL (ref 3.87–5.11)
RDW: 12.3 % (ref 11.5–15.5)
WBC: 17 10*3/uL — ABNORMAL HIGH (ref 4.0–10.5)

## 2015-01-26 LAB — HCG, SERUM, QUALITATIVE: PREG SERUM: NEGATIVE

## 2015-01-26 LAB — LIPASE, BLOOD: LIPASE: 24 U/L (ref 11–51)

## 2015-01-26 MED ORDER — DICYCLOMINE HCL 20 MG PO TABS: 20.0000 mg | ORAL_TABLET | Freq: Two times a day (BID) | ORAL | Status: DC

## 2015-01-26 MED ORDER — SODIUM CHLORIDE 0.9 % IV BOLUS (SEPSIS)
1000.0000 mL | Freq: Once | INTRAVENOUS | Status: AC
  Administered 2015-01-26: 1000 mL via INTRAVENOUS

## 2015-01-26 MED ORDER — ONDANSETRON HCL 4 MG/2ML IJ SOLN
4.0000 mg | Freq: Once | INTRAMUSCULAR | Status: AC
  Administered 2015-01-26: 4 mg via INTRAMUSCULAR
  Filled 2015-01-26: qty 2

## 2015-01-26 MED ORDER — MORPHINE SULFATE (PF) 2 MG/ML IV SOLN
4.0000 mg | Freq: Once | INTRAVENOUS | Status: AC
  Administered 2015-01-26: 4 mg via INTRAVENOUS
  Filled 2015-01-26: qty 2

## 2015-01-26 MED ORDER — IOHEXOL 300 MG/ML  SOLN
100.0000 mL | Freq: Once | INTRAMUSCULAR | Status: AC | PRN
  Administered 2015-01-26: 100 mL via INTRAVENOUS

## 2015-01-26 NOTE — ED Notes (Signed)
Pt ambulated to restroom independently.

## 2015-01-26 NOTE — ED Provider Notes (Signed)
CSN: [id number]     Arrival date & time 01/26/15  4098 History   First MD Initiated Contact with Patient 01/26/15 0725     Chief Complaint  Patient presents with  . Abdominal Pain     (Consider location/radiation/quality/duration/timing/severity/associated sxs/prior Treatment) HPI  25 year old female who presents with abdominal pain. Reports onset of pain two evenings ago. Pain generalized pain, associated with nausea, vomiting and diarrhea. No bloody or melenotic stools. No hematemesis. No sick contacts. No fevers or chills. No urinary symptoms. No recent travel. Denies recent antibiotics. No abnormal vaginal discharge or vaginal bleeding. No prior history of abdominal surgeries. Thinks this is similar to colitis that she has had several years ago. On chart review, recently treated for trichomonas and yeast in early December, which she states is now resolved.   Past Medical History  Diagnosis Date  . Colitis   . Chronic abdominal pain   . Missed period 06/26/2014  . Counseling for birth control, oral contraceptives 06/26/2014   Past Surgical History  Procedure Laterality Date  . Breast surgery     Family History  Problem Relation Age of Onset  . Cancer Mother     cervical  . Alcohol abuse Mother   . Other Father     fatty liver  . Cancer Maternal Grandmother     skin  . Dementia Maternal Grandmother   . Dementia Paternal Grandmother   . Diabetes Paternal Grandfather   . Dementia Paternal Grandfather    Social History  Substance Use Topics  . Smoking status: Never Smoker   . Smokeless tobacco: Never Used  . Alcohol Use: Yes     Comment: occ   OB History    Gravida Para Term Preterm AB TAB SAB Ectopic Multiple Living   0 0 0 0 0 0 0 0 0 0      Review of Systems 10/14 systems reviewed and are negative other than those stated in the HPI    Allergies  Mineral oil  Home Medications   Prior to Admission medications   Medication Sig Start Date End Date Taking?  Authorizing Provider  dicyclomine (BENTYL) 20 MG tablet Take 1 tablet (20 mg total) by mouth 2 (two) times daily. 01/26/15   Lavera Guise, MD   BP 113/65 mmHg  Pulse 98  Temp(Src) 98.2 F (36.8 C) (Oral)  Resp 18  Ht 5\' 2"  (1.575 m)  Wt 140 lb (63.504 kg)  BMI 25.60 kg/m2  SpO2 99%  LMP 01/05/2015 Physical Exam Physical Exam  Nursing note and vitals reviewed. Constitutional: Well developed, well nourished, non-toxic, and in no acute distress Head: Normocephalic and atraumatic.  Mouth/Throat: Oropharynx is clear and moist.  Neck: Normal range of motion. Neck supple.  Cardiovascular: Tachycardic rate and regular rhythm.   Pulmonary/Chest: Effort normal and breath sounds normal.  Abdominal: Soft. There is diffuse tenderness, worst in epigastric and lower abdomen. No CVA tenderness. There is no rebound and no guarding.  Musculoskeletal: Normal range of motion.  Neurological: Alert, no facial droop, fluent speech, moves all extremities symmetrically Skin: Skin is warm and dry.  Psychiatric: Cooperative  ED Course  Procedures (including critical care time) Labs Review Labs Reviewed  CBC WITH DIFFERENTIAL/PLATELET - Abnormal; Notable for the following:    WBC 17.0 (*)    Hemoglobin 15.4 (*)    Neutro Abs 14.6 (*)    Monocytes Absolute 1.1 (*)    All other components within normal limits  URINALYSIS, ROUTINE W REFLEX MICROSCOPIC (  NOT AT [facility]) - Abnormal; Notable for the following:    Ketones, ur 15 (*)    All other components within normal limits  COMPREHENSIVE METABOLIC PANEL  LIPASE, BLOOD  HCG, SERUM, QUALITATIVE    Imaging Review Ct Abdomen Pelvis W Contrast  01/26/2015  CLINICAL DATA:  Lower abdominal pain since yesterday with nausea, vomiting, and diarrhea. History of colitis. EXAM: CT ABDOMEN AND PELVIS WITH CONTRAST TECHNIQUE: Multidetector CT imaging of the abdomen and pelvis was performed using the standard protocol following bolus administration of intravenous  contrast. CONTRAST:  100mL OMNIPAQUE IOHEXOL 300 MG/ML  SOLN COMPARISON:  03/11/2014 FINDINGS: The visualized lung bases are clear. The liver, gallbladder, spleen, adrenal glands, kidneys, and pancreas have an unremarkable enhanced appearance. There is no evidence of bowel obstruction or inflammation. The appendix is identified in the right lower quadrant and is unremarkable. Small right lower quadrant mesenteric lymph nodes measure up to 6-7 mm in short axis, at most minimally more prominent than on the prior study and significantly smaller than in 2012. There is a small amount of free fluid in the pelvis. A 2.1 cm crenulated lesion with avid peripheral enhancement in the right adnexa likely reflects a corpus luteum. The uterus and left ovary are unremarkable. The bladder is unremarkable. The osseous structures are unremarkable. IMPRESSION: 1. Right ovarian corpus luteum with small volume pelvic free fluid. 2. Otherwise unremarkable examination. Electronically Signed   By: Sebastian AcheAllen  Grady M.D.   On: 01/26/2015 09:50   I have personally reviewed and evaluated these images and lab results as part of my medical decision-making.   EKG Interpretation None      MDM   Final diagnoses:  Generalized abdominal pain    In short this is a 25 year old female with prior history of colitis who presents to emergency department with 2 days of generalized abdominal pain associated with nausea, vomiting, and diarrhea. She is tachycardic on arrival, but afebrile and normotensive. The soft and nonsurgical abdomen, with diffuse tenderness primarily in epigastrium and periumbilical and low abdomen. Blood work with leukocytosis of 17. No other major metabolic or electrolyte issues. She is not pregnant.unremarkable lipase and hepatic function panel. Given her leukocytosis and pain which she states is not significantly improved with morphine, CT abdomen pelvis is performed. No acute intra-abdominal processes noted. At this time,  I do not feel that there is a serious or life-threatening intra-abdominal process that is causing her pain. Given prescription for Bentyl and discussed supportive management for home. Strict return and follow-up instructions reviewed. She expressed understanding of all discharge instructions and felt comfortable with the plan of care.   Lavera Guiseana Duo Liu, MD 01/26/15 [id number]

## 2015-01-26 NOTE — Discharge Instructions (Signed)
Return for worsening symptoms, including fever, vomiting and unable to keep down food/fluids, or any other symptoms concerning to you.  Abdominal Pain, Adult Many things can cause abdominal pain. Usually, abdominal pain is not caused by a disease and will improve without treatment. It can often be observed and treated at home. Your health care provider will do a physical exam and possibly order blood tests and X-rays to help determine the seriousness of your pain. However, in many cases, more time must pass before a clear cause of the pain can be found. Before that point, your health care provider may not know if you need more testing or further treatment. HOME CARE INSTRUCTIONS Monitor your abdominal pain for any changes. The following actions may help to alleviate any discomfort you are experiencing:  Only take over-the-counter or prescription medicines as directed by your health care provider.  Do not take laxatives unless directed to do so by your health care provider.  Try a clear liquid diet (broth, tea, or water) as directed by your health care provider. Slowly move to a bland diet as tolerated. SEEK MEDICAL CARE IF:  You have unexplained abdominal pain.  You have abdominal pain associated with nausea or diarrhea.  You have pain when you urinate or have a bowel movement.  You experience abdominal pain that wakes you in the night.  You have abdominal pain that is worsened or improved by eating food.  You have abdominal pain that is worsened with eating fatty foods.  You have a fever. SEEK IMMEDIATE MEDICAL CARE IF:  Your pain does not go away within 2 hours.  You keep throwing up (vomiting).  Your pain is felt only in portions of the abdomen, such as the right side or the left lower portion of the abdomen.  You pass bloody or black tarry stools. MAKE SURE YOU:  Understand these instructions.  Will watch your condition.  Will get help right away if you are not doing  well or get worse.   This information is not intended to replace advice given to you by your health care provider. Make sure you discuss any questions you have with your health care provider.   Document Released: 10/19/2004 Document Revised: 09/30/2014 Document Reviewed: 09/18/2012 Elsevier Interactive Patient Education [employer]2016 Elsevier Inc.

## 2015-01-26 NOTE — ED Notes (Signed)
Having abdominal pain since yesterday  Rates pain 10/10.

## 2015-01-29 ENCOUNTER — Ambulatory Visit (INDEPENDENT_AMBULATORY_CARE_PROVIDER_SITE_OTHER): Payer: 59 | Admitting: Obstetrics and Gynecology

## 2015-01-29 ENCOUNTER — Encounter: Payer: Self-pay | Admitting: Obstetrics and Gynecology

## 2015-01-29 VITALS — BP 120/72 | Ht 62.0 in | Wt 138.0 lb

## 2015-01-29 DIAGNOSIS — N760 Acute vaginitis: Secondary | ICD-10-CM | POA: Insufficient documentation

## 2015-01-29 MED ORDER — METRONIDAZOLE 0.75 % VA GEL: 1.0000 | Freq: Every day | VAGINAL | Status: DC

## 2015-01-29 NOTE — Progress Notes (Signed)
Patient ID: [name] Brown, female   DOB: 05/31/1990, 25 y.o.   MRN: [id number]   [facility]  Patient name: [name] Brown MRN [id number]  Date of birth: 08/13/1990  CC & HPI:  [name] Brown is a 25 y.o. female presenting today for follow up of vaginitis dx. Pt was seen in the office for her symptoms on 12/31/14 and was prescribed Metrogel. She states she used it every other night with mild to moderate relief. Pt reports she is still having intermittent, non-odorous, cream colored vaginal discharge, vaginal irritation and itching. LMP 01/05/15. Pt notes her symptoms were worsened before her period a month ago, but not worsened with her most recent period. Pt states she has not been using tampons and has not tried any new medications in the last week. Pt states she is sexually active and has been with the same partner for 4 months. She states she has been tested for GC/CHL since being with this partner.   ROS:  A complete 10 system review of systems was obtained and all systems are negative except as noted in the HPI and PMH.    Pertinent History Reviewed:   Reviewed: Significant for vaginitis, trichomoniasis  Medical         Past Medical History  Diagnosis Date  . Colitis   . Chronic abdominal pain   . Missed period 06/26/2014  . Counseling for birth control, oral contraceptives 06/26/2014                              Surgical Hx:    Past Surgical History  Procedure Laterality Date  . Breast surgery     Medications: Reviewed & Updated - see associated section                       Current outpatient prescriptions:  .  dicyclomine (BENTYL) 20 MG tablet, Take 1 tablet (20 mg total) by mouth 2 (two) times daily. (Patient not taking: Reported on 01/29/2015), Disp: 20 tablet, Rfl: 0   Social History: Reviewed -  reports that she has never smoked. She has never used smokeless tobacco.  Objective Findings:  Vitals: Blood pressure 120/72, height 5\' 2"  (1.575 m), weight 138 lb (62.596  kg), last menstrual period 01/05/2015.  Physical Examination: General appearance - alert, well appearing, and in no distress Mental status - alert, oriented to person, place, and time Abdomen - soft, nontender, nondistended, no masses or organomegaly Pelvic - normal external genitalia, vulva, vagina, cervix, uterus and adnexa,  VULVA: normal appearing vulva with no masses, tenderness or lesions, Skin tag on the right side of the labia majora.  VAGINA: normal appearing vagina with normal color, no lesions, Heavy cream colored discharge present  CERVIX: normal appearing cervix without discharge or lesions. No cervical irritation    Assessment & Plan:   A:  1. Vaginitis dx on 12/31/14, mild to moderate improvement with Metrogel 2. Continued vaginal discharge, vaginal irritation and itching 3. Wet prep and KOH obtained 4. Wet prep positive for greater than 20 WBC/hyperfeel HPF, rare clue cells, normal epithelial cells. Normal bacteria.  5. KOH negative for yeast and whiff   P:  1. Will prescribe 2-3 refills of Metrogel to use ONCE WEEKLY X 6 WK,  refil x3 2. Follow up in 6 weeks for recheck    By signing my name below, I, Doreatha MartinEva Mathews, attest that this  documentation has been prepared under the direction and in the presence of Tilda Burrow, MD. Electronically Signed: Doreatha Martin, ED Scribe. 01/29/2015. 9:27 AM.  I personally performed the services described in this documentation, which was SCRIBED in my presence. The recorded information has been reviewed and considered accurate. It has been edited as necessary during review. Tilda Burrow, MD

## 2015-01-29 NOTE — Patient Instructions (Signed)
Metronidazole vaginal gel What is this medicine? METRONIDAZOLE (me troe NI da zole) VAGINAL GEL is an antiinfective. It is used to treat bacterial vaginitis. This medicine may be used for other purposes; ask your health care provider or pharmacist if you have questions. What should I tell my health care provider before I take this medicine? They need to know if you have any of these conditions: -if you drink alcohol containing drinks -if you have taken disulfiram in the past two weeks -liver disease -peripheral neuropathy -seizures -an unusual or allergic reaction to metronidazole, parabens, nitroimidazoles, or other medicines, foods, dyes, or preservatives -pregnant or trying to get pregnant -breast-feeding How should I use this medicine? This medicine is only for use in the vagina. Do not take by mouth or apply to other areas of the body. Follow the directions on the prescription label. Wash hands before and after use. Screw the applicator to the tube and squeeze the tube gently to fill the applicator. Lie on your back, part and bend your knees. Insert the applicator tip high in the vagina and push the plunger to release the gel into the vagina. Gently remove the applicator. Wash the applicator well with warm water and soap. Use at regular intervals. Finish the full course prescribed by your doctor or health care professional even if you think your condition is better. Do not stop using except on the advice of your doctor or health care professional. Talk to your pediatrician regarding the use of this medicine in children. Special care may be needed. Overdosage: If you think you have taken too much of this medicine contact a poison control center or emergency room at once. NOTE: This medicine is only for you. Do not share this medicine with others. What if I miss a dose? If you miss a dose, use it as soon as you can. If it is almost time for your next dose, use only that dose. Do not use double  or extra doses. What may interact with this medicine? Do not take this medicine with any of the following medications: -alcohol or any product that contains alcohol -cisapride -dofetilide -dronedarone -pimozide -thioridazine -ziprasidone This medicine may also interact with the following medications: -cimetidine -lithium -other medicines that prolong the QT interval (cause an abnormal heart rhythm) -warfarin This list may not describe all possible interactions. Give your health care provider a list of all the medicines, herbs, non-prescription drugs, or dietary supplements you use. Also tell them if you smoke, drink alcohol, or use illegal drugs. Some items may interact with your medicine. What should I watch for while using this medicine? Tell your doctor or health care professional if your symptoms do not start to get better in 2 or 3 days. Avoid alcoholic drinks while you are taking this medicine and for three days afterwards. Alcohol may make you feel dizzy, sick, or flushed. You may get drowsy or dizzy. Do not drive, use machinery, or do anything that needs mental alertness until you know how this medicine affects you. To reduce the risk of dizzy or fainting spells, do not sit or stand up quickly, especially if you are an older patient. Your clothing may get soiled if you have a vaginal discharge. You can wear a sanitary napkin. Do not use tampons. Wear freshly washed cotton, not synthetic, panties. Do not have sex until you have finished your treatment. Having sex can make the treatment less effective. Your sexual partner may also need treatment. What side effects may I   notice from receiving this medicine? Side effects that you should report to your doctor or health care professional as soon as possible: -dizziness -frequent passing of urine -headache -loss of appetite -nausea -skin rash, itching -stomach pain or cramps -vaginal irritation or discharge -vulvar burning or  swelling Side effects that usually do not require medical attention (report to your doctor or health care professional if they continue or are bothersome): -dark urine -mild vaginal burning This list may not describe all possible side effects. Call your doctor for medical advice about side effects. You may report side effects to FDA at 1-800-FDA-1088. Where should I keep my medicine? Keep out of the reach of children. Store at room temperature between 15 and 30 degrees C (59 and 86 degrees F). Do not freeze. Throw away any unused medicine after the expiration date. NOTE: This sheet is a summary. It may not cover all possible information. If you have questions about this medicine, talk to your doctor, pharmacist, or health care provider.    2016, [employer]vier/[employer]. (2012-08-16 14:09:23)  

## 2015-01-29 NOTE — Progress Notes (Signed)
Patient ID: [name] Brown, female   DOB: 01/11/1991, 25 y.o.   MRN: [id number] Pt here today for follow up. Pt was treated for vaginitis with metrogel and states things got a little better, but she is still very irritated down there.

## 2015-02-05 ENCOUNTER — Ambulatory Visit: Payer: Managed Care, Other (non HMO) | Admitting: Obstetrics & Gynecology

## 2015-03-05 ENCOUNTER — Ambulatory Visit: Payer: Managed Care, Other (non HMO) | Admitting: Obstetrics & Gynecology

## 2015-03-12 ENCOUNTER — Ambulatory Visit: Payer: Managed Care, Other (non HMO) | Admitting: Obstetrics and Gynecology

## 2015-09-07 ENCOUNTER — Ambulatory Visit: Payer: 59 | Admitting: Obstetrics & Gynecology

## 2015-10-12 ENCOUNTER — Ambulatory Visit (INDEPENDENT_AMBULATORY_CARE_PROVIDER_SITE_OTHER): Payer: Self-pay | Admitting: Women's Health

## 2015-10-12 ENCOUNTER — Encounter: Payer: Self-pay | Admitting: Women's Health

## 2015-10-12 ENCOUNTER — Other Ambulatory Visit: Payer: Self-pay | Admitting: Women's Health

## 2015-10-12 VITALS — BP 112/68 | HR 82 | Wt 145.2 lb

## 2015-10-12 DIAGNOSIS — L292 Pruritus vulvae: Secondary | ICD-10-CM

## 2015-10-12 DIAGNOSIS — Z3202 Encounter for pregnancy test, result negative: Secondary | ICD-10-CM

## 2015-10-12 DIAGNOSIS — B3731 Acute candidiasis of vulva and vagina: Secondary | ICD-10-CM

## 2015-10-12 DIAGNOSIS — B373 Candidiasis of vulva and vagina: Secondary | ICD-10-CM

## 2015-10-12 DIAGNOSIS — N390 Urinary tract infection, site not specified: Secondary | ICD-10-CM

## 2015-10-12 DIAGNOSIS — R300 Dysuria: Secondary | ICD-10-CM

## 2015-10-12 LAB — POCT WET PREP (WET MOUNT)
CLUE CELLS WET PREP WHIFF POC: NEGATIVE
TRICHOMONAS WET PREP HPF POC: ABSENT

## 2015-10-12 LAB — POCT URINALYSIS DIPSTICK
Glucose, UA: NEGATIVE
Ketones, UA: NEGATIVE
NITRITE UA: NEGATIVE
PROTEIN UA: NEGATIVE

## 2015-10-12 LAB — POCT URINE PREGNANCY: PREG TEST UR: NEGATIVE

## 2015-10-12 MED ORDER — SULFAMETHOXAZOLE-TRIMETHOPRIM 800-160 MG PO TABS: 1.0000 | ORAL_TABLET | Freq: Two times a day (BID) | ORAL | 0 refills | Status: DC

## 2015-10-12 MED ORDER — FLUCONAZOLE 150 MG PO TABS: 150.0000 mg | ORAL_TABLET | Freq: Once | ORAL | 0 refills | Status: AC

## 2015-10-12 NOTE — Patient Instructions (Signed)
Start taking prenatal vitamins

## 2015-10-12 NOTE — Progress Notes (Signed)
   [facility] Visit  Patient name: [name] Brown MRN [id number]  Date of birth: 01/24/1990  CC & HPI:  [name] Brown is a 25 y.o. G0P0000 Caucasian female presenting today for report of strong odor to urine, urinary frequency and hesitancy for about 1 week. Denies fever/chills. Also reports lower back pain, some vaginal itching, and last 2 periods lighter so requesting pregnancy test. Not currently on any contraception, if 'it happens, it happens'. Not taking pnv. She is self-pay and declines gc/ct at this time. Small skin tag on vulva that irritates her occassionally.  Patient's last menstrual period was 10/08/2015. The current method of family planning is none. Last pap 2016 @ PCP, normal  Pertinent History Reviewed:  Medical & Surgical Hx:   Past medical, surgical, family, and social history reviewed in electronic medical record Medications: Reviewed & Updated - see associated section Allergies: Reviewed in electronic medical record  Objective Findings:  Vitals: BP 112/68 (BP Location: Right Arm, Patient Position: Sitting, Cuff Size: Normal)   Pulse 82   Wt 145 lb 3.2 oz (65.9 kg)   LMP 10/08/2015   BMI 26.56 kg/m  Body mass index is 26.56 kg/m.  Physical Examination: General appearance - alert, well appearing, and in no distress Pelvic - small round slightly raised skin tag w/o stalk on Rt vulva, no pain w/ manipulation- will leave it alone today- do not feel comfortable removing as there is no stalk.  Spec exam: cx visually clear, small amt light pink d/c w/o odor Back: no CVAT, mild tenderness low mid back  Results for orders placed or performed in visit on 10/12/15 (from the past 24 hour(s))  POCT urine pregnancy   Collection Time: 10/12/15  8:55 AM  Result Value Ref Range   Preg Test, Ur Negative Negative  POCT urinalysis dipstick   Collection Time: 10/12/15  8:55 AM  Result Value Ref Range   Color, UA yellow    Clarity, UA cloudy    Glucose, UA neg    Bilirubin, UA     Ketones, UA neg    Spec Grav, UA     Blood, UA moderate    pH, UA     Protein, UA neg    Urobilinogen, UA     Nitrite, UA neg    Leukocytes, UA moderate (2+) (A) Negative  POCT Wet Prep Mellody Drown([facility] [place])   Collection Time: 10/12/15  9:34 AM  Result Value Ref Range   Source Wet Prep POC vaginal    WBC, Wet Prep HPF POC few    Bacteria Wet Prep HPF POC None None, Few, Too numerous to count   BACTERIA WET PREP MORPHOLOGY POC     Clue Cells Wet Prep HPF POC None None, Too numerous to count   Clue Cells Wet Prep Whiff POC Negative Whiff    Yeast Wet Prep HPF POC Few    KOH Wet Prep POC     Trichomonas Wet Prep HPF POC Absent Absent      Assessment & Plan:  A:   UTI  Mild vulvovaginal candida  P:  Rx bactrim bid x 7d for UTI  Rx diflucan 150mg  po x 1, repeat in 3d if needed for yeast, if too expensive get otc yeast cream  Send urine for culture  Start pnv today in case gets pregnant  Return for 1 year for physical.  Marge DuncansBooker, Crews Mccollam Randall CNM, [facility] 10/12/2015 9:34 AM

## 2015-10-18 LAB — URINE CULTURE

## 2016-04-10 ENCOUNTER — Telehealth: Payer: Self-pay | Admitting: *Deleted

## 2016-04-20 ENCOUNTER — Ambulatory Visit (INDEPENDENT_AMBULATORY_CARE_PROVIDER_SITE_OTHER): Payer: Self-pay | Admitting: Women's Health

## 2016-04-20 ENCOUNTER — Encounter: Payer: Self-pay | Admitting: Women's Health

## 2016-04-20 VITALS — BP 110/80 | HR 78 | Ht 64.0 in | Wt 150.0 lb

## 2016-04-20 DIAGNOSIS — Z113 Encounter for screening for infections with a predominantly sexual mode of transmission: Secondary | ICD-10-CM

## 2016-04-20 DIAGNOSIS — N9089 Other specified noninflammatory disorders of vulva and perineum: Secondary | ICD-10-CM

## 2016-04-20 DIAGNOSIS — B9689 Other specified bacterial agents as the cause of diseases classified elsewhere: Secondary | ICD-10-CM

## 2016-04-20 DIAGNOSIS — N760 Acute vaginitis: Secondary | ICD-10-CM

## 2016-04-20 LAB — POCT WET PREP (WET MOUNT)
CLUE CELLS WET PREP WHIFF POC: POSITIVE
Trichomonas Wet Prep HPF POC: ABSENT

## 2016-04-20 MED ORDER — METRONIDAZOLE 500 MG PO TABS: 500.0000 mg | ORAL_TABLET | Freq: Two times a day (BID) | ORAL | 0 refills | Status: DC

## 2016-04-20 NOTE — Progress Notes (Signed)
   [facility] Visit  Patient name: [name] Brown MRN [id number]  Date of birth: 07/19/1990  CC & HPI:  [name] Brown is a 26 y.o. G0P0000 Caucasian female presenting today for report of vulvovaginal itching/irritation. Thought she had a uti, so took keflex she had at home x 1wk. UTI sx resolved, but then vulvovaginal itching/irritation started. No odor. Spotting x 2 when wiping. Does want gc/ct testing.  Patient's last menstrual period was 03/31/2016. Last pap thinks her last one was at [facility], was normal, not sure when  Pertinent History Reviewed:  Medical & Surgical Hx:   Past medical, surgical, family, and social history reviewed in electronic medical record Medications: Reviewed & Updated - see associated section Allergies: Reviewed in electronic medical record  Objective Findings:  Vitals: BP 110/80   Pulse 78   Ht 5\' 4"  (1.626 m)   Wt 150 lb (68 kg)   LMP 03/31/2016   BMI 25.75 kg/m  Body mass index is 25.75 kg/m.  Physical Examination: General appearance - alert, well appearing, and in no distress Pelvic - mod amt off-white d/c w/ slight odor   Results for orders placed or performed in visit on 04/20/16 (from the past 24 hour(s))  POCT Wet Prep Mellody Drown([place])   Collection Time: 04/20/16  4:55 PM  Result Value Ref Range   Source Wet Prep POC vaginal    WBC, Wet Prep HPF POC few    Bacteria Wet Prep HPF POC None (A) Few   BACTERIA WET PREP MORPHOLOGY POC     Clue Cells Wet Prep HPF POC Moderate (A) None   Clue Cells Wet Prep Whiff POC Positive Whiff    Yeast Wet Prep HPF POC None    KOH Wet Prep POC     Trichomonas Wet Prep HPF POC Absent Absent     Assessment & Plan:  A:   BV  STD testing  P:  Rx metronidazole 500mg  BID x 7d for BV, no sex or etoh while taking   GC/CT sent  Return for will call for appt for physical.  Marge DuncansBooker, Zackery Brine Randall CNM, [facility]WHNP-BC 04/20/2016 4:56 PM

## 2016-04-22 LAB — GC/CHLAMYDIA PROBE AMP
CHLAMYDIA, DNA PROBE: NEGATIVE
NEISSERIA GONORRHOEAE BY PCR: NEGATIVE

## 2016-05-01 ENCOUNTER — Telehealth: Payer: Self-pay | Admitting: Obstetrics & Gynecology

## 2016-05-01 NOTE — Telephone Encounter (Signed)
Patient called with complaints of continued bacterial infection even after finishing medication prescribed. Please advise.

## 2016-05-05 MED ORDER — METRONIDAZOLE 500 MG PO TABS: 500.0000 mg | ORAL_TABLET | Freq: Two times a day (BID) | ORAL | 0 refills | Status: DC

## 2016-05-05 NOTE — Telephone Encounter (Signed)
Informed patient that rx flagyl sent. If sx return after this, she will need another appt. Pt verbalized understanding.

## 2016-07-11 ENCOUNTER — Encounter (HOSPITAL_COMMUNITY): Payer: Self-pay | Admitting: Emergency Medicine

## 2016-07-11 ENCOUNTER — Emergency Department (HOSPITAL_COMMUNITY)
Admission: EM | Admit: 2016-07-11 | Discharge: 2016-07-11 | Disposition: A | Discharge status: Home / Self Care | Payer: Managed Care, Other (non HMO) | Attending: Emergency Medicine | Admitting: Emergency Medicine

## 2016-07-11 DIAGNOSIS — J039 Acute tonsillitis, unspecified: Secondary | ICD-10-CM | POA: Insufficient documentation

## 2016-07-11 DIAGNOSIS — K121 Other forms of stomatitis: Secondary | ICD-10-CM | POA: Insufficient documentation

## 2016-07-11 LAB — RAPID STREP SCREEN (MED CTR MEBANE ONLY): STREPTOCOCCUS, GROUP A SCREEN (DIRECT): NEGATIVE

## 2016-07-11 MED ORDER — PENICILLIN G BENZATHINE 1200000 UNIT/2ML IM SUSP
1.2000 10*6.[IU] | Freq: Once | INTRAMUSCULAR | Status: AC
  Administered 2016-07-11: 1.2 10*6.[IU] via INTRAMUSCULAR
  Filled 2016-07-11: qty 2

## 2016-07-11 MED ORDER — PREDNISONE 20 MG PO TABS: ORAL_TABLET | ORAL | 0 refills | Status: DC

## 2016-07-11 MED ORDER — IBUPROFEN 100 MG/5ML PO SUSP
400.0000 mg | Freq: Once | ORAL | Status: AC
  Administered 2016-07-11: 400 mg via ORAL
  Filled 2016-07-11: qty 20

## 2016-07-11 MED ORDER — GI COCKTAIL ~~LOC~~
30.0000 mL | Freq: Once | ORAL | Status: AC
  Administered 2016-07-11: 30 mL via ORAL
  Filled 2016-07-11: qty 30

## 2016-07-11 MED ORDER — DEXAMETHASONE SODIUM PHOSPHATE 10 MG/ML IJ SOLN
10.0000 mg | Freq: Once | INTRAMUSCULAR | Status: AC
  Administered 2016-07-11: 10 mg via INTRAMUSCULAR
  Filled 2016-07-11: qty 1

## 2016-07-11 NOTE — ED Provider Notes (Signed)
[facility] Provider Note   CSN: [id number] Arrival date & time: 07/11/16  1158     History   Chief Complaint Chief Complaint  Patient presents with  . Sore Throat    HPI [name] Brown is a 26 y.o. female.  HPI Patient presents with sore throat, subjective fever and chills, diffuse myalgias, pain with swallowing and voice changes. No known sick contacts. Denies nasal congestion, cough. Denies abdominal pain, vomiting or diarrhea. Past Medical History:  Diagnosis Date  . Chronic abdominal pain   . Colitis   . Counseling for birth control, oral contraceptives 06/26/2014  . Missed period 06/26/2014    Patient Active Problem List   Diagnosis Date Noted  . Vaginitis and vulvovaginitis 01/29/2015  . Abdominal pain 04/22/2014    History reviewed. No pertinent surgical history.  OB History    Gravida Para Term Preterm AB Living   0 0 0 0 0 0   SAB TAB Ectopic Multiple Live Births   0 0 0 0         Home Medications    Prior to Admission medications   Medication Sig Start Date End Date Taking? Authorizing Provider  metroNIDAZOLE (FLAGYL) 500 MG tablet Take 1 tablet (500 mg total) by mouth 2 (two) times daily. X 7 days. No sex or alcohol while taking 05/05/16   Cheral Marker, CNM  predniSONE (DELTASONE) 20 MG tablet 3 tabs po day one, then 2 po daily x 4 days 07/12/16   Loren Racer, MD    Family History Family History  Problem Relation Age of Onset  . Cancer Mother        cervical  . Alcohol abuse Mother   . Other Father        fatty liver  . Cancer Maternal Grandmother        skin  . Dementia Maternal Grandmother   . Dementia Paternal Grandmother   . Diabetes Paternal Grandfather   . Dementia Paternal Grandfather     Social History Social History  Substance Use Topics  . Smoking status: Never Smoker  . Smokeless tobacco: Never Used  . Alcohol use No     Comment: occ     Allergies   Mineral oil   Review of Systems Review of Systems    Constitutional: Positive for appetite change, chills, fatigue and fever.  HENT: Positive for mouth sores, sore throat, trouble swallowing and voice change. Negative for congestion, facial swelling, rhinorrhea and sinus pressure.   Respiratory: Negative for cough and shortness of breath.   Cardiovascular: Negative for chest pain.  Gastrointestinal: Negative for abdominal pain, diarrhea and vomiting.  Genitourinary: Negative for dysuria and flank pain.  Musculoskeletal: Positive for myalgias and neck pain. Negative for neck stiffness.  Skin: Negative for rash and wound.  Neurological: Negative for dizziness, weakness, light-headedness and numbness.  All other systems reviewed and are negative.    Physical Exam Updated Vital Signs BP 104/64 (BP Location: Right Arm)   Pulse 98   Temp 98.4 F (36.9 C) (Oral)   Resp 16   Ht 5\' 4"  (1.626 m)   Wt 67.1 kg (148 lb)   LMP 07/11/2016   SpO2 99%   BMI 25.40 kg/m   Physical Exam  Constitutional: She is oriented to person, place, and time. She appears well-developed and well-nourished. No distress.  HENT:  Head: Normocephalic and atraumatic.  Mouth/Throat: Oropharynx is clear and moist.  Bilateral tonsillar hypertrophy with erythema. Questionable exits present. Patient also  has multiple lesions to the tongue that around with central clearing.  Eyes: EOM are normal. Pupils are equal, round, and reactive to light.  Neck: Normal range of motion. Neck supple.  No meningismus  Cardiovascular: Normal rate and regular rhythm.  Exam reveals no gallop and no friction rub.   No murmur heard. Pulmonary/Chest: Effort normal and breath sounds normal. No stridor. No respiratory distress. She has no wheezes. She has no rales. She exhibits no tenderness.  Abdominal: Soft. Bowel sounds are normal. There is no tenderness. There is no rebound and no guarding.  Musculoskeletal: Normal range of motion. She exhibits no edema or tenderness.  Lymphadenopathy:     She has cervical adenopathy.  Neurological: She is alert and oriented to person, place, and time.  Patient speaks and slightly muffled voice. Moving all extremities without focal deficit. Sensation intact.  Skin: Skin is warm and dry. Capillary refill takes less than 2 seconds. No rash noted. No erythema.  Psychiatric: She has a normal mood and affect. Her behavior is normal.  Nursing note and vitals reviewed.    ED Treatments / Results  Labs (all labs ordered are listed, but only abnormal results are displayed) Labs Reviewed  RAPID STREP SCREEN (NOT AT [facility])  CULTURE, GROUP A STREP [facility])    EKG  EKG Interpretation None       Radiology No results found.  Procedures Procedures (including critical care time)  Medications Ordered in ED Medications  dexamethasone (DECADRON) injection 10 mg (10 mg Intramuscular Given 07/11/16 1313)  penicillin g benzathine (BICILLIN LA) 1200000 UNIT/2ML injection 1.2 Million Units (1.2 Million Units Intramuscular Given 07/11/16 1313)  ibuprofen (ADVIL,MOTRIN) 100 MG/5ML suspension 400 mg (400 mg Oral Given 07/11/16 1311)  gi cocktail (Maalox,Lidocaine,Donnatal) (30 mLs Oral Given 07/11/16 1320)     Initial Impression / Assessment and Plan / ED Course  I have reviewed the triage vital signs and the nursing notes.  Pertinent labs & imaging results that were available during my care of the patient were reviewed by me and considered in my medical decision making (see chart for details).     Given IM Decadron and IM penicillin for possible strep pharyngitis. We'll observe to assure improvement of symptoms. Patient states that swelling in her throat has improved. Voice is much more clear. She is in no respiratory distress. Patient has history of previous colitis. States she was supposed to get a colonoscopy but never has. Question whether stomatitis and colitis may be related to inflammatory bowel disease. Advised to follow-up closely with her  primary physician. Return precautions have been given.  Final Clinical Impressions(s) / ED Diagnoses   Final diagnoses:  Tonsillitis  Stomatitis    New Prescriptions New Prescriptions   PREDNISONE (DELTASONE) 20 MG TABLET    3 tabs po day one, then 2 po daily x 4 days     Loren RacerYelverton, Kassady Laboy, MD 07/11/16 1625

## 2016-07-11 NOTE — ED Triage Notes (Signed)
Pt reports sore throat,nausea,body aches, chills, since last night. Airway patent.

## 2016-07-14 LAB — CULTURE, GROUP A STREP (THRC)

## 2016-08-22 ENCOUNTER — Encounter: Payer: Self-pay | Admitting: Adult Health

## 2016-08-22 ENCOUNTER — Ambulatory Visit (INDEPENDENT_AMBULATORY_CARE_PROVIDER_SITE_OTHER): Payer: Self-pay | Admitting: Adult Health

## 2016-08-22 VITALS — BP 100/60 | HR 74 | Ht 64.0 in | Wt 149.0 lb

## 2016-08-22 DIAGNOSIS — Z3490 Encounter for supervision of normal pregnancy, unspecified, unspecified trimester: Secondary | ICD-10-CM

## 2016-08-22 DIAGNOSIS — N926 Irregular menstruation, unspecified: Secondary | ICD-10-CM

## 2016-08-22 DIAGNOSIS — O3680X0 Pregnancy with inconclusive fetal viability, not applicable or unspecified: Secondary | ICD-10-CM

## 2016-08-22 DIAGNOSIS — Z3201 Encounter for pregnancy test, result positive: Secondary | ICD-10-CM

## 2016-08-22 LAB — POCT URINE PREGNANCY: PREG TEST UR: POSITIVE — AB

## 2016-08-22 MED ORDER — PRENATAL PLUS 27-1 MG PO TABS: 1.0000 | ORAL_TABLET | Freq: Every day | ORAL | 12 refills | Status: DC

## 2016-08-22 NOTE — Progress Notes (Signed)
Subjective:     Patient ID: [name] Brown, female   DOB: 09/29/1990, 26 y.o.   MRN: [id number]  HPI Aggie CosierCrystal is a 26 year old white female in for UPT, has had 2+HPT after missed period.   Review of Systems +missed period with +HPT + cramps and slight white discharge,no odor or itching  Reviewed past medical,surgical, social and family history. Reviewed medications and allergies.     Objective:   Physical Exam BP 100/60 (BP Location: Left Arm, Patient Position: Sitting, Cuff Size: Small)   Pulse 74   Ht 5\' 4"  (1.626 m)   Wt 149 lb (67.6 kg)   LMP 07/16/2016   BMI 25.58 kg/m UPT +, about 5+2 weeks by LMP with EDD 04/23/17.Skin warm and dry. Neck: mid line trachea, normal thyroid, good ROM, no lymphadenopathy noted. Lungs: clear to ausculation bilaterally. Cardiovascular: regular rate and rhythm.Abdomen is soft and non tender.PHQ 9 score 5, denies being suicidal, but may be depressed at times, let me know if wants meds.     Assessment:     1. Pregnancy test positive   2. Pregnancy, unspecified gestational age   413. Encounter to determine fetal viability of pregnancy, single or unspecified fetus       Plan:     Meds ordered this encounter  Medications  . prenatal vitamin w/FE, FA (PRENATAL 1 + 1) 27-1 MG TABS tablet    Sig: Take 1 tablet by mouth daily at 12 noon.    Dispense:  30 each    Refill:  12    Order Specific Question:   Supervising Provider    Answer:   Lazaro ArmsEURE, LUTHER H [[id number]]  Return  in 2 weeks for dating US Review handout on first trimester and by [facility]  Apply for pregnancy medicaid

## 2016-08-22 NOTE — Patient Instructions (Signed)
First Trimester of Pregnancy The first trimester of pregnancy is from week 1 until the end of week 13 (months 1 through 3). A week after a sperm fertilizes an egg, the egg will implant on the wall of the uterus. This embryo will begin to develop into a baby. Genes from you and your partner will form the baby. The female genes will determine whether the baby will be a boy or a girl. At 6-8 weeks, the eyes and face will be formed, and the heartbeat can be seen on ultrasound. At the end of 12 weeks, all the baby's organs will be formed. Now that you are pregnant, you will want to do everything you can to have a healthy baby. Two of the most important things are to get good prenatal care and to follow your health care provider's instructions. Prenatal care is all the medical care you receive before the baby's birth. This care will help prevent, find, and treat any problems during the pregnancy and childbirth. Body changes during your first trimester Your body goes through many changes during pregnancy. The changes vary from woman to woman.  You may gain or lose a couple of pounds at first.  You may feel sick to your stomach (nauseous) and you may throw up (vomit). If the vomiting is uncontrollable, call your health care provider.  You may tire easily.  You may develop headaches that can be relieved by medicines. All medicines should be approved by your health care provider.  You may urinate more often. Painful urination may mean you have a bladder infection.  You may develop heartburn as a result of your pregnancy.  You may develop constipation because certain hormones are causing the muscles that push stool through your intestines to slow down.  You may develop hemorrhoids or swollen veins (varicose veins).  Your breasts may begin to grow larger and become tender. Your nipples may stick out more, and the tissue that surrounds them (areola) may become darker.  Your gums may bleed and may be  sensitive to brushing and flossing.  Dark spots or blotches (chloasma, mask of pregnancy) may develop on your face. This will likely fade after the baby is born.  Your menstrual periods will stop.  You may have a loss of appetite.  You may develop cravings for certain kinds of food.  You may have changes in your emotions from day to day, such as being excited to be pregnant or being concerned that something may go wrong with the pregnancy and baby.  You may have more vivid and strange dreams.  You may have changes in your hair. These can include thickening of your hair, rapid growth, and changes in texture. Some women also have hair loss during or after pregnancy, or hair that feels dry or thin. Your hair will most likely return to normal after your baby is born.  What to expect at prenatal visits During a routine prenatal visit:  You will be weighed to make sure you and the baby are growing normally.  Your blood pressure will be taken.  Your abdomen will be measured to track your baby's growth.  The fetal heartbeat will be listened to between weeks 10 and 14 of your pregnancy.  Test results from any previous visits will be discussed.  Your health care provider may ask you:  How you are feeling.  If you are feeling the baby move.  If you have had any abnormal symptoms, such as leaking fluid, bleeding, severe headaches,   or abdominal cramping.  If you are using any tobacco products, including cigarettes, chewing tobacco, and electronic cigarettes.  If you have any questions.  Other tests that may be performed during your first trimester include:  Blood tests to find your blood type and to check for the presence of any previous infections. The tests will also be used to check for low iron levels (anemia) and protein on red blood cells (Rh antibodies). Depending on your risk factors, or if you previously had diabetes during pregnancy, you may have tests to check for high blood  sugar that affects pregnant women (gestational diabetes).  Urine tests to check for infections, diabetes, or protein in the urine.  An ultrasound to confirm the proper growth and development of the baby.  Fetal screens for spinal cord problems (spina bifida) and Down syndrome.  HIV (human immunodeficiency virus) testing. Routine prenatal testing includes screening for HIV, unless you choose not to have this test.  You may need other tests to make sure you and the baby are doing well.  Follow these instructions at home: Medicines  Follow your health care provider's instructions regarding medicine use. Specific medicines may be either safe or unsafe to take during pregnancy.  Take a prenatal vitamin that contains at least 600 micrograms (mcg) of folic acid.  If you develop constipation, try taking a stool softener if your health care provider approves. Eating and drinking  Eat a balanced diet that includes fresh fruits and vegetables, whole grains, good sources of protein such as meat, eggs, or tofu, and low-fat dairy. Your health care provider will help you determine the amount of weight gain that is right for you.  Avoid raw meat and uncooked cheese. These carry germs that can cause birth defects in the baby.  Eating four or five small meals rather than three large meals a day may help relieve nausea and vomiting. If you start to feel nauseous, eating a few soda crackers can be helpful. Drinking liquids between meals, instead of during meals, also seems to help ease nausea and vomiting.  Limit foods that are high in fat and processed sugars, such as fried and sweet foods.  To prevent constipation: ? Eat foods that are high in fiber, such as fresh fruits and vegetables, whole grains, and beans. ? Drink enough fluid to keep your urine clear or pale yellow. Activity  Exercise only as directed by your health care provider. Most women can continue their usual exercise routine during  pregnancy. Try to exercise for 30 minutes at least 5 days a week. Exercising will help you: ? Control your weight. ? Stay in shape. ? Be prepared for labor and delivery.  Experiencing pain or cramping in the lower abdomen or lower back is a good sign that you should stop exercising. Check with your health care provider before continuing with normal exercises.  Try to avoid standing for long periods of time. Move your legs often if you must stand in one place for a long time.  Avoid heavy lifting.  Wear low-heeled shoes and practice good posture.  You may continue to have sex unless your health care provider tells you not to. Relieving pain and discomfort  Wear a good support bra to relieve breast tenderness.  Take warm sitz baths to soothe any pain or discomfort caused by hemorrhoids. Use hemorrhoid cream if your health care provider approves.  Rest with your legs elevated if you have leg cramps or low back pain.  If you develop   varicose veins in your legs, wear support hose. Elevate your feet for 15 minutes, 3-4 times a day. Limit salt in your diet. Prenatal care  Schedule your prenatal visits by the twelfth week of pregnancy. They are usually scheduled monthly at first, then more often in the last 2 months before delivery.  Write down your questions. Take them to your prenatal visits.  Keep all your prenatal visits as told by your health care provider. This is important. Safety  Wear your seat belt at all times when driving.  Make a list of emergency phone numbers, including numbers for family, friends, the hospital, and police and fire departments. General instructions  Ask your health care provider for a referral to a local prenatal education class. Begin classes no later than the beginning of month 6 of your pregnancy.  Ask for help if you have counseling or nutritional needs during pregnancy. Your health care provider can offer advice or refer you to specialists for help  with various needs.  Do not use hot tubs, steam rooms, or saunas.  Do not douche or use tampons or scented sanitary pads.  Do not cross your legs for long periods of time.  Avoid cat litter boxes and soil used by cats. These carry germs that can cause birth defects in the baby and possibly loss of the fetus by miscarriage or stillbirth.  Avoid all smoking, herbs, alcohol, and medicines not prescribed by your health care provider. Chemicals in these products affect the formation and growth of the baby.  Do not use any products that contain nicotine or tobacco, such as cigarettes and e-cigarettes. If you need help quitting, ask your health care provider. You may receive counseling support and other resources to help you quit.  Schedule a dentist appointment. At home, brush your teeth with a soft toothbrush and be gentle when you floss. Contact a health care provider if:  You have dizziness.  You have mild pelvic cramps, pelvic pressure, or nagging pain in the abdominal area.  You have persistent nausea, vomiting, or diarrhea.  You have a bad smelling vaginal discharge.  You have pain when you urinate.  You notice increased swelling in your face, hands, legs, or ankles.  You are exposed to fifth disease or chickenpox.  You are exposed to German measles (rubella) and have never had it. Get help right away if:  You have a fever.  You are leaking fluid from your vagina.  You have spotting or bleeding from your vagina.  You have severe abdominal cramping or pain.  You have rapid weight gain or loss.  You vomit blood or material that looks like coffee grounds.  You develop a severe headache.  You have shortness of breath.  You have any kind of trauma, such as from a fall or a car accident. Summary  The first trimester of pregnancy is from week 1 until the end of week 13 (months 1 through 3).  Your body goes through many changes during pregnancy. The changes vary from  woman to woman.  You will have routine prenatal visits. During those visits, your health care provider will examine you, discuss any test results you may have, and talk with you about how you are feeling. This information is not intended to replace advice given to you by your health care provider. Make sure you discuss any questions you have with your health care provider. Document Released: 01/03/2001 Document Revised: 12/22/2015 Document Reviewed: 12/22/2015 Elsevier Interactive Patient Education  2017 Elsevier   Inc.  

## 2016-09-04 ENCOUNTER — Telehealth: Payer: Self-pay | Admitting: Advanced Practice Midwife

## 2016-09-04 NOTE — Telephone Encounter (Signed)
Will give samples of bonjesta at US appt tomorrow

## 2016-09-05 ENCOUNTER — Ambulatory Visit (INDEPENDENT_AMBULATORY_CARE_PROVIDER_SITE_OTHER): Payer: Medicaid Other

## 2016-09-05 DIAGNOSIS — O3680X0 Pregnancy with inconclusive fetal viability, not applicable or unspecified: Secondary | ICD-10-CM

## 2016-09-05 NOTE — Progress Notes (Signed)
US 8+3 wks,single IUP w/ys,positive fht 173 bpm,normal ovaries bilat,crl 19.52 mm,EDD 04/14/2017

## 2016-09-11 ENCOUNTER — Encounter: Payer: Self-pay | Admitting: Adult Health

## 2016-09-11 ENCOUNTER — Ambulatory Visit (INDEPENDENT_AMBULATORY_CARE_PROVIDER_SITE_OTHER): Payer: Medicaid Other | Admitting: Adult Health

## 2016-09-11 VITALS — BP 110/70 | HR 66 | Ht 64.0 in | Wt 149.0 lb

## 2016-09-11 DIAGNOSIS — N760 Acute vaginitis: Secondary | ICD-10-CM | POA: Diagnosis not present

## 2016-09-11 DIAGNOSIS — N898 Other specified noninflammatory disorders of vagina: Secondary | ICD-10-CM | POA: Insufficient documentation

## 2016-09-11 DIAGNOSIS — B9689 Other specified bacterial agents as the cause of diseases classified elsewhere: Secondary | ICD-10-CM

## 2016-09-11 DIAGNOSIS — Z3A09 9 weeks gestation of pregnancy: Secondary | ICD-10-CM

## 2016-09-11 LAB — POCT WET PREP (WET MOUNT)
Clue Cells Wet Prep Whiff POC: POSITIVE
WBC WET PREP: POSITIVE

## 2016-09-11 MED ORDER — METRONIDAZOLE 500 MG PO TABS: 500.0000 mg | ORAL_TABLET | Freq: Two times a day (BID) | ORAL | 0 refills | Status: DC

## 2016-09-11 NOTE — Progress Notes (Signed)
Subjective:     Patient ID: [name] Brown, female   DOB: December 13, 1990, 26 y.o.   MRN: [id number]  HPI Allysha is a 26 year old white female, about 9+[redacted] weeks pregnant in complaining of vaginal discharge with odor, for about a month.No itching or burning.   Review of Systems +vaginal discharge with odor No itching or burning  Patient denies any headaches, hearing loss, fatigue, blurred vision, shortness of breath, chest pain, abdominal pain, problems with bowel movements, urination, or intercourse. No joint pain or mood swings.    Objective:   Physical Exam BP 110/70 (BP Location: Left Arm, Patient Position: Sitting, Cuff Size: Normal)   Pulse 66   Ht 5\' 4"  (1.626 m)   Wt 149 lb (67.6 kg)   LMP 07/16/2016   BMI 25.58 kg/m  Skin warm and dry.Pelvic: external genitalia is normal in appearance no lesions, vagina: white discharge with odor,urethra has no lesions or masses noted, cervix:smooth, uterus: about 9-10 weeks in size, non tender, no masses felt, adnexa: no masses or tenderness noted. Bladder is non tender and no masses felt. Wet prep: + for clue cells and +WBCs.   +FHR 178 via doppler  Assessment:     1. Vaginal discharge   2. BV (bacterial vaginosis)   3. [redacted] weeks gestation of pregnancy       Plan:     Rx flagyl 500 mg 1 bid x 7 days, review handout on BV,take at 12 weeks, no alcohol Return as scheduled for new OB visit

## 2016-09-11 NOTE — Patient Instructions (Addendum)
Bacterial Vaginosis Bacterial vaginosis is a vaginal infection that occurs when the normal balance of bacteria in the vagina is disrupted. It results from an overgrowth of certain bacteria. This is the most common vaginal infection among women ages 15-44. Because bacterial vaginosis increases your risk for STIs (sexually transmitted infections), getting treated can help reduce your risk for chlamydia, gonorrhea, herpes, and HIV (human immunodeficiency virus). Treatment is also important for preventing complications in pregnant women, because this condition can cause an early (premature) delivery. What are the causes? This condition is caused by an increase in harmful bacteria that are normally present in small amounts in the vagina. However, the reason that the condition develops is not fully understood. What increases the risk? The following factors may make you more likely to develop this condition:  Having a new sexual partner or multiple sexual partners.  Having unprotected sex.  Douching.  Having an intrauterine device (IUD).  Smoking.  Drug and alcohol abuse.  Taking certain antibiotic medicines.  Being pregnant.  You cannot get bacterial vaginosis from toilet seats, bedding, swimming pools, or contact with objects around you. What are the signs or symptoms? Symptoms of this condition include:  Grey or white vaginal discharge. The discharge can also be watery or foamy.  A fish-like odor with discharge, especially after sexual intercourse or during menstruation.  Itching in and around the vagina.  Burning or pain with urination.  Some women with bacterial vaginosis have no signs or symptoms. How is this diagnosed? This condition is diagnosed based on:  Your medical history.  A physical exam of the vagina.  Testing a sample of vaginal fluid under a microscope to look for a large amount of bad bacteria or abnormal cells. Your health care provider may use a cotton swab  or a small wooden spatula to collect the sample.  How is this treated? This condition is treated with antibiotics. These may be given as a pill, a vaginal cream, or a medicine that is put into the vagina (suppository). If the condition comes back after treatment, a second round of antibiotics may be needed. Follow these instructions at home: Medicines  Take over-the-counter and prescription medicines only as told by your health care provider.  Take or use your antibiotic as told by your health care provider. Do not stop taking or using the antibiotic even if you start to feel better. General instructions  If you have a female sexual partner, tell her that you have a vaginal infection. She should see her health care provider and be treated if she has symptoms. If you have a female sexual partner, he does not need treatment.  During treatment: ? Avoid sexual activity until you finish treatment. ? Do not douche. ? Avoid alcohol as directed by your health care provider. ? Avoid breastfeeding as directed by your health care provider.  Drink enough water and fluids to keep your urine clear or pale yellow.  Keep the area around your vagina and rectum clean. ? Wash the area daily with warm water. ? Wipe yourself from front to back after using the toilet.  Keep all follow-up visits as told by your health care provider. This is important. How is this prevented?  Do not douche.  Wash the outside of your vagina with warm water only.  Use protection when having sex. This includes latex condoms and dental dams.  Limit how many sexual partners you have. To help prevent bacterial vaginosis, it is best to have sex with just   one partner (monogamous).  Make sure you and your sexual partner are tested for STIs.  Wear cotton or cotton-lined underwear.  Avoid wearing tight pants and pantyhose, especially during summer.  Limit the amount of alcohol that you drink.  Do not use any products that  contain nicotine or tobacco, such as cigarettes and e-cigarettes. If you need help quitting, ask your health care provider.  Do not use illegal drugs. Where to find more information:  Centers for Disease Control and Prevention: SolutionApps.co.za  American Sexual Health Association (ASHA): www.ashastd.org  [place] Department of Health and Health and safety inspector, Office on Women's Health: ConventionalMedicines.si or http://www.anderson-williamson.info/ Contact a health care provider if:  Your symptoms do not improve, even after treatment.  You have more discharge or pain when urinating.  You have a fever.  You have pain in your abdomen.  You have pain during sex.  You have vaginal bleeding between periods. Summary  Bacterial vaginosis is a vaginal infection that occurs when the normal balance of bacteria in the vagina is disrupted.  Because bacterial vaginosis increases your risk for STIs (sexually transmitted infections), getting treated can help reduce your risk for chlamydia, gonorrhea, herpes, and HIV (human immunodeficiency virus). Treatment is also important for preventing complications in pregnant women, because the condition can cause an early (premature) delivery.  This condition is treated with antibiotic medicines. These may be given as a pill, a vaginal cream, or a medicine that is put into the vagina (suppository). This information is not intended to replace advice given to you by your health care provider. Make sure you discuss any questions you have with your health care provider. Document Released: 01/09/2005 Document Revised: 09/25/2015 Document Reviewed: 09/25/2015 Elsevier Interactive Patient Education  2017 Elsevier Inc. Take flagyl at 12 weeks

## 2016-09-20 ENCOUNTER — Ambulatory Visit (INDEPENDENT_AMBULATORY_CARE_PROVIDER_SITE_OTHER): Payer: Medicaid Other | Admitting: Advanced Practice Midwife

## 2016-09-20 ENCOUNTER — Encounter: Payer: Self-pay | Admitting: Advanced Practice Midwife

## 2016-09-20 ENCOUNTER — Ambulatory Visit: Payer: Medicaid Other | Admitting: *Deleted

## 2016-09-20 ENCOUNTER — Other Ambulatory Visit (HOSPITAL_COMMUNITY)
Admission: RE | Admit: 2016-09-20 | Discharge: 2016-09-20 | Disposition: A | Discharge status: Home / Self Care | Payer: Medicaid Other | Source: Ambulatory Visit | Attending: Obstetrics & Gynecology | Admitting: Obstetrics & Gynecology

## 2016-09-20 VITALS — BP 100/60 | HR 98 | Wt 148.0 lb

## 2016-09-20 DIAGNOSIS — Z3A10 10 weeks gestation of pregnancy: Secondary | ICD-10-CM

## 2016-09-20 DIAGNOSIS — Z01419 Encounter for gynecological examination (general) (routine) without abnormal findings: Secondary | ICD-10-CM | POA: Diagnosis not present

## 2016-09-20 DIAGNOSIS — Z1389 Encounter for screening for other disorder: Secondary | ICD-10-CM

## 2016-09-20 DIAGNOSIS — Z124 Encounter for screening for malignant neoplasm of cervix: Secondary | ICD-10-CM

## 2016-09-20 DIAGNOSIS — Z3682 Encounter for antenatal screening for nuchal translucency: Secondary | ICD-10-CM

## 2016-09-20 DIAGNOSIS — Z3401 Encounter for supervision of normal first pregnancy, first trimester: Secondary | ICD-10-CM

## 2016-09-20 DIAGNOSIS — Z331 Pregnant state, incidental: Secondary | ICD-10-CM

## 2016-09-20 DIAGNOSIS — Z3400 Encounter for supervision of normal first pregnancy, unspecified trimester: Secondary | ICD-10-CM | POA: Insufficient documentation

## 2016-09-20 LAB — POCT URINALYSIS DIPSTICK
Blood, UA: NEGATIVE
GLUCOSE UA: NEGATIVE
LEUKOCYTES UA: NEGATIVE
Nitrite, UA: NEGATIVE
PROTEIN UA: NEGATIVE

## 2016-09-20 NOTE — Progress Notes (Signed)
  Subjective:    [name] Brown is a G1P0000 [redacted]w[redacted]d being seen today for her first obstetrical visit.  Her obstetrical history is significant for first baby.  Pregnancy history fully reviewed. Has hx of IBS/colitis, but if avoids certain foods she's OK. FOB not involved. Dx w/BV a few weeks ago, but asymptomatic, so waiting a few more weeks to take flagyl.  Can't remember for sure when pap was  Patient reports no complaints.not having to take diclegis anymore.    Vitals:   09/20/16 1112  BP: 100/60  Pulse: 98  Weight: 148 lb (67.1 kg)    HISTORY: OB History  Gravida Para Term Preterm AB Living  1 0 0 0 0 0  SAB TAB Ectopic Multiple Live Births  0 0 0 0      # Outcome Date GA Lbr Len/2nd Weight Sex Delivery Anes PTL Lv  1 Current              Past Medical History:  Diagnosis Date  . Chronic abdominal pain   . Colitis   . Counseling for birth control, oral contraceptives 06/26/2014  . Missed period 06/26/2014   History reviewed. No pertinent surgical history. Family History  Problem Relation Age of Onset  . Cancer Mother        cervical  . Alcohol abuse Mother   . Other Father        fatty liver  . Cancer Maternal Grandmother        skin  . Dementia Maternal Grandmother   . Dementia Paternal Grandmother   . Diabetes Paternal Grandfather   . Dementia Paternal Grandfather      Exam       Pelvic Exam:    Perineum: Normal Perineum   Vulva: normal   Vagina:  normal mucosa, small amount white discharge w/o odor, no palpable nodules   Uterus Normal, Gravid, FH: 10     Cervix: Normal  Pap collected   Adnexa: Not palpable   Urinary:  urethral meatus normal    System:     Skin: normal coloration and turgor, no rashes    Neurologic: oriented, normal, normal mood   Extremities: normal strength, tone, and muscle mass   HEENT PERRLA   Mouth/Teeth mucous membranes moist, normal dentition   Neck supple and no masses   Cardiovascular: regular rate and rhythm   Respiratory:  appears well, vitals normal, no respiratory distress, acyanotic   Abdomen: soft, non-tender;  FHR: 160 [place]          Assessment:    Pregnancy: G1P0000 Patient Active Problem List   Diagnosis Date Noted  . Supervision of normal first pregnancy 09/20/2016  . [redacted] weeks gestation of pregnancy 09/11/2016  . BV (bacterial vaginosis) 09/11/2016  . Vaginal discharge 09/11/2016  . Vaginitis and vulvovaginitis 01/29/2015  . Abdominal pain 04/22/2014        Plan:     Initial labs drawn. Continue prenatal vitamins  Problem list reviewed and updated  Reviewed n/v relief measures and warning s/s to report  Reviewed recommended weight gain based on pre-gravid BMI  Encouraged well-balanced diet Genetic Screening discussed Integrated Screen: requested.  Ultrasound discussed; fetal survey: requested.  Return in about 2 weeks (around 10/04/2016) for LROB, US:NT+1st IT.  CRESENZO-DISHMAN,Sahar Ryback 09/20/2016

## 2016-09-20 NOTE — Patient Instructions (Signed)
 First Trimester of Pregnancy The first trimester of pregnancy is from week 1 until the end of week 12 (months 1 through 3). A week after a sperm fertilizes an egg, the egg will implant on the wall of the uterus. This embryo will begin to develop into a baby. Genes from you and your partner are forming the baby. The female genes determine whether the baby is a boy or a girl. At 6-8 weeks, the eyes and face are formed, and the heartbeat can be seen on ultrasound. At the end of 12 weeks, all the baby's organs are formed.  Now that you are pregnant, you will want to do everything you can to have a healthy baby. Two of the most important things are to get good prenatal care and to follow your health care provider's instructions. Prenatal care is all the medical care you receive before the baby's birth. This care will help prevent, find, and treat any problems during the pregnancy and childbirth. BODY CHANGES Your body goes through many changes during pregnancy. The changes vary from woman to woman.   You may gain or lose a couple of pounds at first.  You may feel sick to your stomach (nauseous) and throw up (vomit). If the vomiting is uncontrollable, call your health care provider.  You may tire easily.  You may develop headaches that can be relieved by medicines approved by your health care provider.  You may urinate more often. Painful urination may mean you have a bladder infection.  You may develop heartburn as a result of your pregnancy.  You may develop constipation because certain hormones are causing the muscles that push waste through your intestines to slow down.  You may develop hemorrhoids or swollen, bulging veins (varicose veins).  Your breasts may begin to grow larger and become tender. Your nipples may stick out more, and the tissue that surrounds them (areola) may become darker.  Your gums may bleed and may be sensitive to brushing and flossing.  Dark spots or blotches  (chloasma, mask of pregnancy) may develop on your face. This will likely fade after the baby is born.  Your menstrual periods will stop.  You may have a loss of appetite.  You may develop cravings for certain kinds of food.  You may have changes in your emotions from day to day, such as being excited to be pregnant or being concerned that something may go wrong with the pregnancy and baby.  You may have more vivid and strange dreams.  You may have changes in your hair. These can include thickening of your hair, rapid growth, and changes in texture. Some women also have hair loss during or after pregnancy, or hair that feels dry or thin. Your hair will most likely return to normal after your baby is born. WHAT TO EXPECT AT YOUR PRENATAL VISITS During a routine prenatal visit:  You will be weighed to make sure you and the baby are growing normally.  Your blood pressure will be taken.  Your abdomen will be measured to track your baby's growth.  The fetal heartbeat will be listened to starting around week 10 or 12 of your pregnancy.  Test results from any previous visits will be discussed. Your health care provider may ask you:  How you are feeling.  If you are feeling the baby move.  If you have had any abnormal symptoms, such as leaking fluid, bleeding, severe headaches, or abdominal cramping.  If you have any questions. Other   tests that may be performed during your first trimester include:  Blood tests to find your blood type and to check for the presence of any previous infections. They will also be used to check for low iron levels (anemia) and Rh antibodies. Later in the pregnancy, blood tests for diabetes will be done along with other tests if problems develop.  Urine tests to check for infections, diabetes, or protein in the urine.  An ultrasound to confirm the proper growth and development of the baby.  An amniocentesis to check for possible genetic problems.  Fetal  screens for spina bifida and Down syndrome.  You may need other tests to make sure you and the baby are doing well. HOME CARE INSTRUCTIONS  Medicines  Follow your health care provider's instructions regarding medicine use. Specific medicines may be either safe or unsafe to take during pregnancy.  Take your prenatal vitamins as directed.  If you develop constipation, try taking a stool softener if your health care provider approves. Diet  Eat regular, well-balanced meals. Choose a variety of foods, such as meat or vegetable-based protein, fish, milk and low-fat dairy products, vegetables, fruits, and whole grain breads and cereals. Your health care provider will help you determine the amount of weight gain that is right for you.  Avoid raw meat and uncooked cheese. These carry germs that can cause birth defects in the baby.  Eating four or five small meals rather than three large meals a day may help relieve nausea and vomiting. If you start to feel nauseous, eating a few soda crackers can be helpful. Drinking liquids between meals instead of during meals also seems to help nausea and vomiting.  If you develop constipation, eat more high-fiber foods, such as fresh vegetables or fruit and whole grains. Drink enough fluids to keep your urine clear or pale yellow. Activity and Exercise  Exercise only as directed by your health care provider. Exercising will help you:  Control your weight.  Stay in shape.  Be prepared for labor and delivery.  Experiencing pain or cramping in the lower abdomen or low back is a good sign that you should stop exercising. Check with your health care provider before continuing normal exercises.  Try to avoid standing for long periods of time. Move your legs often if you must stand in one place for a long time.  Avoid heavy lifting.  Wear low-heeled shoes, and practice good posture.  You may continue to have sex unless your health care provider directs you  otherwise. Relief of Pain or Discomfort  Wear a good support bra for breast tenderness.   Take warm sitz baths to soothe any pain or discomfort caused by hemorrhoids. Use hemorrhoid cream if your health care provider approves.   Rest with your legs elevated if you have leg cramps or low back pain.  If you develop varicose veins in your legs, wear support hose. Elevate your feet for 15 minutes, 3-4 times a day. Limit salt in your diet. Prenatal Care  Schedule your prenatal visits by the twelfth week of pregnancy. They are usually scheduled monthly at first, then more often in the last 2 months before delivery.  Write down your questions. Take them to your prenatal visits.  Keep all your prenatal visits as directed by your health care provider. Safety  Wear your seat belt at all times when driving.  Make a list of emergency phone numbers, including numbers for family, friends, the hospital, and police and fire departments. General   Tips  Ask your health care provider for a referral to a local prenatal education class. Begin classes no later than at the beginning of month 6 of your pregnancy.  Ask for help if you have counseling or nutritional needs during pregnancy. Your health care provider can offer advice or refer you to specialists for help with various needs.  Do not use hot tubs, steam rooms, or saunas.  Do not douche or use tampons or scented sanitary pads.  Do not cross your legs for long periods of time.  Avoid cat litter boxes and soil used by cats. These carry germs that can cause birth defects in the baby and possibly loss of the fetus by miscarriage or stillbirth.  Avoid all smoking, herbs, alcohol, and medicines not prescribed by your health care provider. Chemicals in these affect the formation and growth of the baby.  Schedule a dentist appointment. At home, brush your teeth with a soft toothbrush and be gentle when you floss. SEEK MEDICAL CARE IF:   You have  dizziness.  You have mild pelvic cramps, pelvic pressure, or nagging pain in the abdominal area.  You have persistent nausea, vomiting, or diarrhea.  You have a bad smelling vaginal discharge.  You have pain with urination.  You notice increased swelling in your face, hands, legs, or ankles. SEEK IMMEDIATE MEDICAL CARE IF:   You have a fever.  You are leaking fluid from your vagina.  You have spotting or bleeding from your vagina.  You have severe abdominal cramping or pain.  You have rapid weight gain or loss.  You vomit blood or material that looks like coffee grounds.  You are exposed to German measles and have never had them.  You are exposed to fifth disease or chickenpox.  You develop a severe headache.  You have shortness of breath.  You have any kind of trauma, such as from a fall or a car accident. Document Released: 01/03/2001 Document Revised: 05/26/2013 Document Reviewed: 11/19/2012 Exit[employer] Patient Information 2015 [employer]. This information is not intended to replace advice given to you by your health care provider. Make sure you discuss any questions you have with your health care provider.   Nausea & Vomiting  Have saltine crackers or pretzels by your bed and eat a few bites before you raise your head out of bed in the morning  Eat small frequent meals throughout the day instead of large meals  Drink plenty of fluids throughout the day to stay hydrated, just don't drink a lot of fluids with your meals.  This can make your stomach fill up faster making you feel sick  Do not brush your teeth right after you eat  Products with real ginger are good for nausea, like ginger ale and ginger hard candy Make sure it says made with real ginger!  Sucking on sour candy like lemon heads is also good for nausea  If your prenatal vitamins make you nauseated, take them at night so you will sleep through the nausea  Sea Bands  If you feel like you need  medicine for the nausea & vomiting please let us know  If you are unable to keep any fluids or food down please let us know   Constipation  Drink plenty of fluid, preferably water, throughout the day  Eat foods high in fiber such as fruits, vegetables, and grains  Exercise, such as walking, is a good way to keep your bowels regular  Drink warm fluids, especially warm   prune juice, or decaf coffee  Eat a 1/2 cup of real oatmeal (not instant), 1/2 cup applesauce, and 1/2-1 cup warm prune juice every day  If needed, you may take Colace (docusate sodium) stool softener once or twice a day to help keep the stool soft. If you are pregnant, wait until you are out of your first trimester (12-14 weeks of pregnancy)  If you still are having problems with constipation, you may take Miralax once daily as needed to help keep your bowels regular.  If you are pregnant, wait until you are out of your first trimester (12-14 weeks of pregnancy)  Safe Medications in Pregnancy   Acne: Benzoyl Peroxide Salicylic Acid  Backache/Headache: Tylenol: 2 regular strength every 4 hours OR              2 Extra strength every 6 hours  Colds/Coughs/Allergies: Benadryl (alcohol free) 25 mg every 6 hours as needed Breath right strips Claritin Cepacol throat lozenges Chloraseptic throat spray Cold-Eeze- up to three times per day Cough drops, alcohol free Flonase (by prescription only) Guaifenesin Mucinex Robitussin DM (plain only, alcohol free) Saline nasal spray/drops Sudafed (pseudoephedrine) & Actifed ** use only after [redacted] weeks gestation and if you do not have high blood pressure Tylenol Vicks Vaporub Zinc lozenges Zyrtec   Constipation: Colace Ducolax suppositories Fleet enema Glycerin suppositories Metamucil Milk of magnesia Miralax Senokot Smooth move tea  Diarrhea: Kaopectate Imodium A-D  *NO pepto Bismol  Hemorrhoids: Anusol Anusol HC Preparation  H Tucks  Indigestion: Tums Maalox Mylanta Zantac  Pepcid  Insomnia: Benadryl (alcohol free) 25mg every 6 hours as needed Tylenol PM Unisom, no Gelcaps  Leg Cramps: Tums MagGel  Nausea/Vomiting:  Bonine Dramamine Emetrol Ginger extract Sea bands Meclizine  Nausea medication to take during pregnancy:  Unisom (doxylamine succinate 25 mg tablets) Take one tablet daily at bedtime. If symptoms are not adequately controlled, the dose can be increased to a maximum recommended dose of two tablets daily (1/2 tablet in the morning, 1/2 tablet mid-afternoon and one at bedtime). Vitamin B6 100mg tablets. Take one tablet twice a day (up to 200 mg per day).  Skin Rashes: Aveeno products Benadryl cream or 25mg every 6 hours as needed Calamine Lotion 1% cortisone cream  Yeast infection: Gyne-lotrimin 7 Monistat 7   **If taking multiple medications, please check labels to avoid duplicating the same active ingredients **take medication as directed on the label ** Do not exceed 4000 mg of tylenol in 24 hours **Do not take medications that contain aspirin or ibuprofen      

## 2016-09-21 LAB — URINALYSIS, ROUTINE W REFLEX MICROSCOPIC
BILIRUBIN UA: NEGATIVE
GLUCOSE, UA: NEGATIVE
Leukocytes, UA: NEGATIVE
NITRITE UA: NEGATIVE
Protein, UA: NEGATIVE
RBC UA: NEGATIVE
SPEC GRAV UA: 1.021 (ref 1.005–1.030)
UUROB: 0.2 mg/dL (ref 0.2–1.0)
pH, UA: 7 (ref 5.0–7.5)

## 2016-09-21 LAB — PMP SCREEN PROFILE (10S), URINE
AMPHETAMINE SCREEN URINE: NEGATIVE ng/mL
BARBITURATE SCREEN URINE: NEGATIVE ng/mL
BENZODIAZEPINE SCREEN, URINE: NEGATIVE ng/mL
CANNABINOIDS UR QL SCN: NEGATIVE ng/mL
Cocaine (Metab) Scrn, Ur: NEGATIVE ng/mL
Creatinine(Crt), U: 146.9 mg/dL (ref 20.0–300.0)
METHADONE SCREEN, URINE: NEGATIVE ng/mL
OXYCODONE+OXYMORPHONE UR QL SCN: NEGATIVE ng/mL
Opiate Scrn, Ur: NEGATIVE ng/mL
PH UR, DRUG SCRN: 6.7 (ref 4.5–8.9)
PHENCYCLIDINE QUANTITATIVE URINE: NEGATIVE ng/mL
PROPOXYPHENE SCREEN URINE: NEGATIVE ng/mL

## 2016-09-21 LAB — CBC
HEMATOCRIT: 40.2 % (ref 34.0–46.6)
HEMOGLOBIN: 13.3 g/dL (ref 11.1–15.9)
MCH: 31.7 pg (ref 26.6–33.0)
MCHC: 33.1 g/dL (ref 31.5–35.7)
MCV: 96 fL (ref 79–97)
Platelets: 284 10*3/uL (ref 150–379)
RBC: 4.19 x10E6/uL (ref 3.77–5.28)
RDW: 14 % (ref 12.3–15.4)
WBC: 8.9 10*3/uL (ref 3.4–10.8)

## 2016-09-21 LAB — GC/CHLAMYDIA PROBE AMP
Chlamydia trachomatis, NAA: NEGATIVE
NEISSERIA GONORRHOEAE BY PCR: NEGATIVE

## 2016-09-21 LAB — CYTOLOGY - PAP: Diagnosis: NEGATIVE

## 2016-09-21 LAB — HIV ANTIBODY (ROUTINE TESTING W REFLEX): HIV Screen 4th Generation wRfx: NONREACTIVE

## 2016-09-21 LAB — ANTIBODY SCREEN: Antibody Screen: NEGATIVE

## 2016-09-21 LAB — VARICELLA ZOSTER ANTIBODY, IGG: VARICELLA: 540 {index} (ref >165)

## 2016-09-21 LAB — RPR: RPR: NONREACTIVE

## 2016-09-21 LAB — HEPATITIS B SURFACE ANTIGEN: Hepatitis B Surface Ag: NEGATIVE

## 2016-09-21 LAB — ABO/RH: Rh Factor: POSITIVE

## 2016-09-21 LAB — RUBELLA SCREEN: Rubella Antibodies, IGG: <0.9 index — ABNORMAL LOW (ref >0.99)

## 2016-09-21 LAB — MED LIST OPTION NOT SELECTED

## 2016-09-22 LAB — URINE CULTURE: ORGANISM ID, BACTERIA: NO GROWTH

## 2016-10-04 ENCOUNTER — Ambulatory Visit (INDEPENDENT_AMBULATORY_CARE_PROVIDER_SITE_OTHER): Payer: Medicaid Other

## 2016-10-04 ENCOUNTER — Encounter: Payer: Self-pay | Admitting: Obstetrics and Gynecology

## 2016-10-04 ENCOUNTER — Ambulatory Visit (INDEPENDENT_AMBULATORY_CARE_PROVIDER_SITE_OTHER): Payer: Medicaid Other | Admitting: Obstetrics and Gynecology

## 2016-10-04 VITALS — BP 102/54 | HR 82 | Wt 143.0 lb

## 2016-10-04 DIAGNOSIS — Z331 Pregnant state, incidental: Secondary | ICD-10-CM

## 2016-10-04 DIAGNOSIS — Z3682 Encounter for antenatal screening for nuchal translucency: Secondary | ICD-10-CM

## 2016-10-04 DIAGNOSIS — Z1389 Encounter for screening for other disorder: Secondary | ICD-10-CM

## 2016-10-04 DIAGNOSIS — Z3A12 12 weeks gestation of pregnancy: Secondary | ICD-10-CM

## 2016-10-04 DIAGNOSIS — Z3401 Encounter for supervision of normal first pregnancy, first trimester: Secondary | ICD-10-CM

## 2016-10-04 LAB — POCT URINALYSIS DIPSTICK
Blood, UA: NEGATIVE
GLUCOSE UA: NEGATIVE
Ketones, UA: NEGATIVE
NITRITE UA: NEGATIVE
Protein, UA: NEGATIVE

## 2016-10-04 NOTE — Progress Notes (Signed)
US 12+4 wks,measurements c/w dates,normal ovaries bilat,NT 1.6 mm,NB present,crl 60.58 mm,fhr 160 bpm

## 2016-10-04 NOTE — Progress Notes (Signed)
Patient ID: [name] Brown, female   DOB: 08/21/1990, 26 y.o.   MRN: [id number] G1P0000  [facility]  Chief Complaint  Patient presents with  . Routine Prenatal Visit    u/s today; 1st IT  ____  Patient complaints: Pt reports that she wakes up during the night and can't fall back to sleep. She is working full time during the week and picks up shifts on the weekends. Patient reports good fetal movement, denies any bleeding, rupture of membranes,or regular contractions.  Blood pressure (!) 102/54, pulse 82, weight 143 lb (64.9 kg), last menstrual period 07/16/2016.   Urine results:notable for 2+ Leukocytes, otherwise negative refer to the ob flow sheet for FH and FHR, ,                          Physical Examination: General appearance - alert, well appearing, and in no distress and oriented to person, place, and time                                      Abdomen - FH N/A , nontender                                                        -FHR 160 per US Questions were answered. Assessment: LROB G1P0000 @ 6336w4d   Plan:  Continued routine obstetrical care,   F/u in 4 weeks for LROB, 2nd IT  By signing my name below, I, Diona BrownerJennifer Gorman, attest that this documentation has been prepared under the direction and in the presence of Tilda BurrowFerguson, Arnette Driggs V, MD. Electronically Signed: Diona BrownerJennifer Gorman, Medical Scribe. 10/04/16. 10:46 AM.  I personally performed the services described in this documentation, which was SCRIBED in my presence. The recorded information has been reviewed and considered accurate. It has been edited as necessary during review. Tilda BurrowFERGUSON,Ivonna Kinnick V, MD

## 2016-10-09 LAB — INTEGRATED 1
CROWN RUMP LENGTH MAT SCREEN: 60.6 mm
GEST. AGE ON COLLECTION DATE: 12.4 wk
Maternal Age at EDD: 24.8 yr
NUMBER OF FETUSES: 1
Nuchal Translucency (NT): 1.6 mm
PAPP-A Value: 649 ng/mL
WEIGHT: 143 [lb_av]

## 2016-10-16 ENCOUNTER — Telehealth: Payer: Self-pay | Admitting: Obstetrics & Gynecology

## 2016-10-16 NOTE — Telephone Encounter (Signed)
Patient called asking if Mucinex sinus Max is safe to take during pregnancy. Spoke to Dr Despina Hidden and stated if it had anything other than Guaifenesin in it, that it may not be safe. Advised to also try sudafed/Claritin/ or Zyrtec if needed. Also states PNV makes her constipated. Advised to try increasing diet in fiber, fluids, etc. Verbalized understanding.

## 2016-11-01 ENCOUNTER — Ambulatory Visit (INDEPENDENT_AMBULATORY_CARE_PROVIDER_SITE_OTHER): Payer: Medicaid Other | Admitting: Advanced Practice Midwife

## 2016-11-01 ENCOUNTER — Encounter: Payer: Self-pay | Admitting: Advanced Practice Midwife

## 2016-11-01 VITALS — BP 108/68 | HR 61 | Wt 152.0 lb

## 2016-11-01 DIAGNOSIS — Z1389 Encounter for screening for other disorder: Secondary | ICD-10-CM

## 2016-11-01 DIAGNOSIS — Z3A16 16 weeks gestation of pregnancy: Secondary | ICD-10-CM

## 2016-11-01 DIAGNOSIS — Z23 Encounter for immunization: Secondary | ICD-10-CM | POA: Diagnosis not present

## 2016-11-01 DIAGNOSIS — Z3402 Encounter for supervision of normal first pregnancy, second trimester: Secondary | ICD-10-CM

## 2016-11-01 DIAGNOSIS — Z363 Encounter for antenatal screening for malformations: Secondary | ICD-10-CM

## 2016-11-01 DIAGNOSIS — Z331 Pregnant state, incidental: Secondary | ICD-10-CM

## 2016-11-01 DIAGNOSIS — Z1379 Encounter for other screening for genetic and chromosomal anomalies: Secondary | ICD-10-CM

## 2016-11-01 LAB — POCT URINALYSIS DIPSTICK
Blood, UA: NEGATIVE
GLUCOSE UA: NEGATIVE
Ketones, UA: NEGATIVE
NITRITE UA: NEGATIVE
Protein, UA: NEGATIVE

## 2016-11-01 MED ORDER — FLUCONAZOLE 150 MG PO TABS: ORAL_TABLET | ORAL | 2 refills | Status: DC

## 2016-11-01 NOTE — Progress Notes (Addendum)
G1P0000 [redacted]w[redacted]d Estimated Date of Delivery: 04/14/17  Blood pressure 108/68, pulse 61, weight 152 lb (68.9 kg), last menstrual period 07/16/2016.   BP weight and urine results all reviewed and noted.  Please refer to the obstetrical flow sheet for the fundal height and fetal heart rate documentation:  Patienteographi denies any bleeding and no rupture of membranes symptoms or regular contractions. Patient c/o vaginal yeast and white fuzzy tongue last week. Looks more like geographical tongue, but will give a diflucan since there are multiple complaints All questions were answered.  Orders Placed This Encounter  Procedures  . US OB Comp + 14 Wk  . INTEGRATED 2  . POCT urinalysis dipstick    Plan:  Continued routine obstetrical care, FLU SHOT TODAY  Return in about 3 weeks (around 11/22/2016) for LROB, [id number].

## 2016-11-09 LAB — INTEGRATED 2
ADSF: 1.19
AFP MARKER: 21.8 ng/mL
AFP MoM: 0.67
CROWN RUMP LENGTH: 60.6 mm
DIA MOM: 0.77
DIA Value: 127.6 pg/mL
ESTRIOL UNCONJUGATED: 0.99 ng/mL
GESTATIONAL AGE: 16.4 wk
Gest. Age on Collection Date: 12.4 weeks
Maternal Age at EDD: 24.8 yr
NUCHAL TRANSLUCENCY MOM: 1.04
NUMBER OF FETUSES: 1
Nuchal Translucency (NT): 1.6 mm
PAPP-A MoM: 0.68
PAPP-A VALUE: 649 ng/mL
TEST RESULTS: NEGATIVE
WEIGHT: 143 [lb_av]
Weight: 152 [lb_av]
hCG MoM: 1.13
hCG Value: 34.7 IU/mL

## 2016-11-15 ENCOUNTER — Telehealth: Payer: Self-pay | Admitting: Advanced Practice Midwife

## 2016-11-15 NOTE — Telephone Encounter (Signed)
Pt states that she hurt her back on Saturday. She states that it now hurts to walk and sit. She states that she also has a sharp pain going down her leg. Advised her that it could be sciatic nerve pain. She states she has not tried tylenol. Advised her to try that or warm bath/heating pad to the back. No c/o vaginal bleeding or abdominal cramping. Informed pt that we had an available appt in the morning if she felt like she needed to be seen. She wished to take the appt.

## 2016-11-16 ENCOUNTER — Encounter: Payer: Self-pay | Admitting: Obstetrics & Gynecology

## 2016-11-16 ENCOUNTER — Ambulatory Visit (INDEPENDENT_AMBULATORY_CARE_PROVIDER_SITE_OTHER): Payer: Medicaid Other | Admitting: Obstetrics & Gynecology

## 2016-11-16 VITALS — BP 98/60 | HR 98 | Wt 155.0 lb

## 2016-11-16 DIAGNOSIS — Z331 Pregnant state, incidental: Secondary | ICD-10-CM | POA: Diagnosis not present

## 2016-11-16 DIAGNOSIS — Z1389 Encounter for screening for other disorder: Secondary | ICD-10-CM | POA: Diagnosis not present

## 2016-11-16 DIAGNOSIS — M549 Dorsalgia, unspecified: Secondary | ICD-10-CM

## 2016-11-16 DIAGNOSIS — M7918 Myalgia, other site: Secondary | ICD-10-CM

## 2016-11-16 LAB — POCT URINALYSIS DIPSTICK
GLUCOSE UA: NEGATIVE
KETONES UA: NEGATIVE
Leukocytes, UA: NEGATIVE
Nitrite, UA: NEGATIVE
Protein, UA: NEGATIVE
RBC UA: NEGATIVE

## 2016-11-16 MED ORDER — CYCLOBENZAPRINE HCL 10 MG PO TABS: 10.0000 mg | ORAL_TABLET | Freq: Three times a day (TID) | ORAL | 1 refills | Status: DC | PRN

## 2016-11-16 NOTE — Progress Notes (Signed)
Chief Complaint  Patient presents with  . work-in-ob    back pain    [name] Brown comes in today with a 5 day history of lower back pain bilateral but has really become left much greater than right with eferred pain down her left leg Thighs a sharp pain she rates it at a level of 6 when she came in to the office this morning Before Saturday she had never experienced this before and does not have chronic problems with back pain or any other pain syndrome She didn't really do anything to hurt achieved simply lay down to help her dad do something in it caught at that point She's been taking Tylenol and warm showers and that helps but certainly the problem continues She reports fetal fluttering no bleeding no direct pregnancy issues Walking seems to make it worse and the pain is pretty much thereabout all the time  Blood pressure 98/60, pulse 98, weight 155 lb (70.3 kg), last menstrual period 07/16/2016.  On exam today Ashtin definitely has muscle spasm in the lumbar region paraspinous muscles radiating a triangle out laterally from the midline She doesn't really have any problems with the ones on the right although there little tender there is no significant problem there  Trigger Point Injection   Pre-operative diagnosis: Trigger points of the left lumbar paraspinous muscles radiating in a triangular fashion  Post-operative diagnosis: Same  After risks and benefits were explained including bleeding, infection, worsening of the pain, damage to the area being injected, weakness, allergic reaction to medications, vascular injection, and nerve damage, signed consent was obtained.  All questions were answered.    The area of the trigger point was identified and the skin prepped three times with alcohol and the alcohol allowed to dry.  Next, a 25 gauge 0.5 inch needle was placed in the area of the trigger point.  Once reproduction of the pain was elicited and negative aspiration confirmed, the  trigger point was injected and the needle removed.    The patient did tolerate the procedure well and there were not complications.    Medication used:  10 cc 0.5% marcaine    Trigger points injected: several    Trigger point(s) location(s):  left

## 2016-11-22 ENCOUNTER — Ambulatory Visit (INDEPENDENT_AMBULATORY_CARE_PROVIDER_SITE_OTHER): Payer: Medicaid Other

## 2016-11-22 ENCOUNTER — Encounter: Payer: Self-pay | Admitting: Advanced Practice Midwife

## 2016-11-22 ENCOUNTER — Ambulatory Visit (INDEPENDENT_AMBULATORY_CARE_PROVIDER_SITE_OTHER): Payer: Medicaid Other | Admitting: Advanced Practice Midwife

## 2016-11-22 VITALS — BP 110/80 | HR 99 | Wt 155.4 lb

## 2016-11-22 DIAGNOSIS — Z3A19 19 weeks gestation of pregnancy: Secondary | ICD-10-CM

## 2016-11-22 DIAGNOSIS — Z363 Encounter for antenatal screening for malformations: Secondary | ICD-10-CM | POA: Diagnosis not present

## 2016-11-22 DIAGNOSIS — Z3482 Encounter for supervision of other normal pregnancy, second trimester: Secondary | ICD-10-CM

## 2016-11-22 DIAGNOSIS — Z3402 Encounter for supervision of normal first pregnancy, second trimester: Secondary | ICD-10-CM

## 2016-11-22 DIAGNOSIS — Z331 Pregnant state, incidental: Secondary | ICD-10-CM

## 2016-11-22 DIAGNOSIS — Z1389 Encounter for screening for other disorder: Secondary | ICD-10-CM

## 2016-11-22 LAB — POCT URINALYSIS DIPSTICK
Blood, UA: NEGATIVE
Glucose, UA: NEGATIVE
KETONES UA: NEGATIVE
LEUKOCYTES UA: NEGATIVE
Nitrite, UA: NEGATIVE
PROTEIN UA: NEGATIVE

## 2016-11-22 NOTE — Patient Instructions (Signed)

## 2016-11-22 NOTE — Progress Notes (Signed)
G1P0000 9240w4d Estimated Date of Delivery: 04/14/17  Blood pressure 110/80, pulse 99, weight 155 lb 6.4 oz (70.5 kg), last menstrual period 07/16/2016.   BP weight and urine results all reviewed and noted.  US 19+4 wks,cephalic,cx 4 cm,post pl gr 0,normal ovaries bilat,svp of fluid 5 cm,fhr 155 bpm,EFW 314 g,anatomy complete,no obvious abnormalities   Please refer to the obstetrical flow sheet for the fundal height and fetal heart rate documentation:  Patient reports good fetal movement, denies any bleeding and no rupture of membranes symptoms or regular contractions. Patient is without complaints. All questions were answered.  Orders Placed This Encounter  Procedures  . POCT urinalysis dipstick    Plan:  Continued routine obstetrical care,   Return in about 4 weeks (around 12/20/2016) for LROB.

## 2016-11-22 NOTE — Progress Notes (Signed)
US 19+4 wks,cephalic,cx 4 cm,post pl gr 0,normal ovaries bilat,svp of fluid 5 cm,fhr 155 bpm,EFW 314 g,anatomy complete,no obvious abnormalities

## 2016-11-27 ENCOUNTER — Encounter: Payer: Medicaid Other | Admitting: Women's Health

## 2016-11-27 ENCOUNTER — Other Ambulatory Visit: Payer: Medicaid Other

## 2016-11-30 ENCOUNTER — Encounter: Payer: Medicaid Other | Admitting: Obstetrics & Gynecology

## 2016-12-08 ENCOUNTER — Emergency Department (HOSPITAL_COMMUNITY)
Admission: EM | Admit: 2016-12-08 | Discharge: 2016-12-08 | Disposition: A | Discharge status: Home / Self Care | Payer: Medicaid Other | Attending: Emergency Medicine | Admitting: Emergency Medicine

## 2016-12-08 ENCOUNTER — Other Ambulatory Visit: Payer: Self-pay

## 2016-12-08 ENCOUNTER — Encounter (HOSPITAL_COMMUNITY): Payer: Self-pay | Admitting: Emergency Medicine

## 2016-12-08 DIAGNOSIS — R1032 Left lower quadrant pain: Secondary | ICD-10-CM | POA: Diagnosis not present

## 2016-12-08 DIAGNOSIS — O9989 Other specified diseases and conditions complicating pregnancy, childbirth and the puerperium: Secondary | ICD-10-CM | POA: Insufficient documentation

## 2016-12-08 DIAGNOSIS — Z3A00 Weeks of gestation of pregnancy not specified: Secondary | ICD-10-CM | POA: Insufficient documentation

## 2016-12-08 DIAGNOSIS — Z79899 Other long term (current) drug therapy: Secondary | ICD-10-CM | POA: Diagnosis not present

## 2016-12-08 LAB — URINALYSIS, ROUTINE W REFLEX MICROSCOPIC
BILIRUBIN URINE: NEGATIVE
Glucose, UA: NEGATIVE mg/dL
HGB URINE DIPSTICK: NEGATIVE
Ketones, ur: 5 mg/dL — AB
Leukocytes, UA: NEGATIVE
Nitrite: NEGATIVE
Protein, ur: NEGATIVE mg/dL
SPECIFIC GRAVITY, URINE: 1.017 (ref 1.005–1.030)
pH: 6 (ref 5.0–8.0)

## 2016-12-08 LAB — COMPREHENSIVE METABOLIC PANEL
ALT: 64 U/L — AB (ref 14–54)
AST: 36 U/L (ref 15–41)
Albumin: 3.1 g/dL — ABNORMAL LOW (ref 3.5–5.0)
Alkaline Phosphatase: 61 U/L (ref 38–126)
Anion gap: 7 (ref 5–15)
BUN: 6 mg/dL (ref 6–20)
CHLORIDE: 107 mmol/L (ref 101–111)
CO2: 22 mmol/L (ref 22–32)
CREATININE: 0.43 mg/dL — AB (ref 0.44–1.00)
Calcium: 9.1 mg/dL (ref 8.9–10.3)
GFR calc non Af Amer: >60 mL/min (ref >60)
Glucose, Bld: 79 mg/dL (ref 65–99)
POTASSIUM: 3.4 mmol/L — AB (ref 3.5–5.1)
SODIUM: 136 mmol/L (ref 135–145)
Total Bilirubin: 0.4 mg/dL (ref 0.3–1.2)
Total Protein: 6.5 g/dL (ref 6.5–8.1)

## 2016-12-08 LAB — CBC WITH DIFFERENTIAL/PLATELET
BASOS ABS: 0 10*3/uL (ref 0.0–0.1)
BASOS PCT: 0 %
EOS ABS: 0.1 10*3/uL (ref 0.0–0.7)
EOS PCT: 1 %
HCT: 32.9 % — ABNORMAL LOW (ref 36.0–46.0)
Hemoglobin: 10.9 g/dL — ABNORMAL LOW (ref 12.0–15.0)
LYMPHS ABS: 2.6 10*3/uL (ref 0.7–4.0)
Lymphocytes Relative: 17 %
MCH: 31.7 pg (ref 26.0–34.0)
MCHC: 33.1 g/dL (ref 30.0–36.0)
MCV: 95.6 fL (ref 78.0–100.0)
MONOS PCT: 11 %
Monocytes Absolute: 1.6 10*3/uL — ABNORMAL HIGH (ref 0.1–1.0)
Neutro Abs: 10.7 10*3/uL — ABNORMAL HIGH (ref 1.7–7.7)
Neutrophils Relative %: 71 %
PLATELETS: 266 10*3/uL (ref 150–400)
RBC: 3.44 MIL/uL — AB (ref 3.87–5.11)
RDW: 13.1 % (ref 11.5–15.5)
WBC: 15.1 10*3/uL — AB (ref 4.0–10.5)

## 2016-12-08 NOTE — Progress Notes (Signed)
[facility]: [facility] ED, Patient in with right lower abd pain for last 3 hours. G1P0, at [redacted] weeks gestation.  Patient denies vaginal bleeding or leaking of fluid. Patient denies falling or MVA. Patient placed on fetal heart monitor by ED staff. Will continue to monitoring FHT.

## 2016-12-08 NOTE — ED Triage Notes (Addendum)
Pt c/o right lower abd pain x 3 hours. Pt denies any vaginal discharge or bleeding. Pt states she called obgyn for the same and [facility].

## 2016-12-08 NOTE — Progress Notes (Signed)
TCT Dr Macon LargeAnyanwu and updated on patient and FHT. Patient OB cleared. Pollux.RosserOBRR RN will f/u with ED provider to ensure patient is getting appendicitis/UTI work up.

## 2016-12-08 NOTE — Discharge Instructions (Signed)
Your testing this evening showed no specific findings though you did have a slight elevation in your white blood cell and a slight decrease in your red blood cell, both of these things can be seen in pregnancy.  I discussed her care with the OB/GYN on-call, Dr. Emelda FearFerguson who recommends that you go to your OB/GYN on Monday for follow-up or [facility] should your symptoms worsen.  Especially if you develop any lower right-sided abdominal pain fevers or vomiting.  You may always return to the emergency department if you are [facility] to [facility] and you are concerned or having worsening symptoms

## 2016-12-08 NOTE — ED Notes (Signed)
Pt ambulatory to waiting room. Pt verbalized understanding of discharge instructions.   

## 2016-12-08 NOTE — Progress Notes (Signed)
TCT Dr Hyacinth MeekerMiller, ED provider and advised patient is OB cleared. Advised that Dr. Terisa StarrAnyanu recommended appendicitis and UTI work up. Dr. Hyacinth MeekerMiller states patients pain is on left side and that urine neg for UTI and looking at possible ovarian pain and possible u/s if pain not controlled.

## 2016-12-08 NOTE — ED Notes (Signed)
OB rapid response advised pt is cleared by OB.

## 2016-12-08 NOTE — ED Provider Notes (Signed)
[facility]ERGENCY DEPARTMENT Provider Note   CSN: [id number] Arrival date & time: 12/08/16  1922     History   Chief Complaint Chief Complaint  Patient presents with  . Abdominal Pain    HPI [name] Brown is a 26 y.o. female.  HPI  26 year old female, presents at 2021 weeks of gestation with her first pregnancy with a complaint of left lower abdominal pain which started 3 hours ago.  The pain is been constant, worse with palpation, not associated with nausea vomiting or diarrhea.  She did not have a bowel movement yesterday but had one today.  No blood.  No urinary symptoms including frequency urgency or dysuria.  The patient denies any vaginal bleeding or loss of fluids.  She has felt the baby moving.  She has not had any complications with this pregnancy thus far, she takes a prenatal vitamin and follows with her OB/GYN regularly.  Past Medical History:  Diagnosis Date  . Chronic abdominal pain   . Colitis   . Counseling for birth control, oral contraceptives 06/26/2014  . Missed period 06/26/2014    Patient Active Problem List   Diagnosis Date Noted  . Supervision of normal first pregnancy 09/20/2016  . [redacted] weeks gestation of pregnancy 09/11/2016  . BV (bacterial vaginosis) 09/11/2016  . Vaginal discharge 09/11/2016  . Vaginitis and vulvovaginitis 01/29/2015  . Abdominal pain 04/22/2014    History reviewed. No pertinent surgical history.  OB History    Gravida Para Term Preterm AB Living   1 0 0 0 0 0   SAB TAB Ectopic Multiple Live Births   0 0 0 0         Home Medications    Prior to Admission medications   Medication Sig Start Date End Date Taking? Authorizing Provider  flintstones complete (FLINTSTONES) 60 MG chewable tablet Chew 2 tablets by mouth daily.   Yes [provider]    Family History Family History  Problem Relation Age of Onset  . Cancer Mother        cervical  . Alcohol abuse Mother   . Other Father        fatty liver  .  Cancer Maternal Grandmother        skin  . Dementia Maternal Grandmother   . Dementia Paternal Grandmother   . Diabetes Paternal Grandfather   . Dementia Paternal Grandfather     Social History Social History   Tobacco Use  . Smoking status: Never Smoker  . Smokeless tobacco: Never Used  Substance Use Topics  . Alcohol use: No    Comment: occ  . Drug use: No     Allergies   Mineral oil   Review of Systems Review of Systems  Constitutional: Negative for chills and fever.  HENT: Negative for sore throat.   Eyes: Negative for visual disturbance.  Respiratory: Negative for cough and shortness of breath.   Cardiovascular: Negative for chest pain.  Gastrointestinal: Positive for abdominal pain. Negative for diarrhea, nausea and vomiting.  Genitourinary: Negative for dysuria and frequency.  Musculoskeletal: Negative for back pain and neck pain.  Skin: Negative for rash.  Neurological: Negative for weakness, numbness and headaches.  Hematological: Negative for adenopathy.  Psychiatric/Behavioral: Negative for behavioral problems.     Physical Exam Updated Vital Signs BP 125/85   Pulse 86   Temp 98.5 F (36.9 C)   Resp 18   Ht 5\' 4"  (1.626 m)   Wt 72.6 kg (160 lb)  LMP 07/16/2016   SpO2 100%   BMI 27.46 kg/m   Physical Exam  Constitutional: She appears well-developed and well-nourished. No distress.  HENT:  Head: Normocephalic and atraumatic.  Mouth/Throat: Oropharynx is clear and moist. No oropharyngeal exudate.  Eyes: Conjunctivae and EOM are normal. Pupils are equal, round, and reactive to light. Right eye exhibits no discharge. Left eye exhibits no discharge. No scleral icterus.  Neck: Normal range of motion. Neck supple. No JVD present. No thyromegaly present.  Cardiovascular: Normal rate, regular rhythm, normal heart sounds and intact distal pulses. Exam reveals no gallop and no friction rub.  No murmur heard. Pulmonary/Chest: Effort normal and breath  sounds normal. No respiratory distress. She has no wheezes. She has no rales.  Abdominal: Soft. Bowel sounds are normal. She exhibits no distension and no mass. There is no tenderness.  LLQ ttp, no other abd ttp, uterus just above the umbilicus  Genitourinary:  Genitourinary Comments: Chaperone present for exam, normal-appearing vaginal vault, small amount of white discharge, no foul odor, no foreign body, no bleeding, cervical os closed  Musculoskeletal: Normal range of motion. She exhibits no edema or tenderness.  Lymphadenopathy:    She has no cervical adenopathy.  Neurological: She is alert. Coordination normal.  Skin: Skin is warm and dry. No rash noted. No erythema.  Psychiatric: She has a normal mood and affect. Her behavior is normal.  Nursing note and vitals reviewed.    ED Treatments / Results  Labs (all labs ordered are listed, but only abnormal results are displayed) Labs Reviewed  URINALYSIS, ROUTINE W REFLEX MICROSCOPIC - Abnormal; Notable for the following components:      Result Value   Ketones, ur 5 (*)    All other components within normal limits  CBC WITH DIFFERENTIAL/PLATELET - Abnormal; Notable for the following components:   WBC 15.1 (*)    RBC 3.44 (*)    Hemoglobin 10.9 (*)    HCT 32.9 (*)    Neutro Abs 10.7 (*)    Monocytes Absolute 1.6 (*)    All other components within normal limits  COMPREHENSIVE METABOLIC PANEL - Abnormal; Notable for the following components:   Potassium 3.4 (*)    Creatinine, Ser 0.43 (*)    Albumin 3.1 (*)    ALT 64 (*)    All other components within normal limits     Radiology No results found.  Procedures Procedures (including critical care time)  Medications Ordered in ED Medications - No data to display   Initial Impression / Assessment and Plan / ED Course  I have reviewed the triage vital signs and the nursing notes.  Pertinent labs & imaging results that were available during my care of the patient were  reviewed by me and considered in my medical decision making (see chart for details).     Abdomen is essentially nontender except for the left lower quadrant, this does raise some concern for the ovary but the patient appears very comfortable and has a soft abdomen.  She does not appear to be in distress, is not vomiting, will check patient on the toco monitor.  Pain is constant for 3 hours, it does not come and go, it does not appear consistent with contractions  Discussed with Dr. Emelda FearFerguson at 10:25 PM, he recommends no further workup as long as the cervix is long and competent, he requests patient follow-[facility] on Monday [facility] if things get worse but agreeable that no advanced imaging or transfer  to higher level of care would be needed this evening as this does not appear consistent with appendicitis and she does not have the classic appearance of ovarian torsion.  Patient agreeable to the plan, stable for discharge  Final Clinical Impressions(s) / ED Diagnoses   Final diagnoses:  Left lower quadrant pain    ED Discharge Orders    None       Eber Hong, MD 12/08/16 2259

## 2016-12-20 ENCOUNTER — Encounter: Payer: Self-pay | Admitting: Women's Health

## 2016-12-20 ENCOUNTER — Ambulatory Visit (INDEPENDENT_AMBULATORY_CARE_PROVIDER_SITE_OTHER): Payer: Medicaid Other | Admitting: Women's Health

## 2016-12-20 VITALS — BP 110/70 | HR 98 | Wt 165.8 lb

## 2016-12-20 DIAGNOSIS — Z1389 Encounter for screening for other disorder: Secondary | ICD-10-CM

## 2016-12-20 DIAGNOSIS — Z331 Pregnant state, incidental: Secondary | ICD-10-CM

## 2016-12-20 DIAGNOSIS — Z3402 Encounter for supervision of normal first pregnancy, second trimester: Secondary | ICD-10-CM

## 2016-12-20 LAB — POCT URINALYSIS DIPSTICK
GLUCOSE UA: NEGATIVE
Ketones, UA: NEGATIVE
NITRITE UA: NEGATIVE
PROTEIN UA: NEGATIVE
RBC UA: NEGATIVE

## 2016-12-20 NOTE — Progress Notes (Signed)
   LOW-RISK PREGNANCY VISIT Patient name: [name] Brown MRN [id number]  Date of birth: 01/10/1991 Chief Complaint:   Routine Prenatal Visit  History of Present Illness:   [name] Brown is a 26 y.o. 881P0000 female at 944w4d with an Estimated Date of Delivery: 04/14/17 being seen today for ongoing management of a low-risk pregnancy.  Today she reports no complaints. Contractions: Not present.  .  Movement: Present. denies leaking of fluid. Review of Systems:   Pertinent items are noted in HPI Denies abnormal vaginal discharge w/ itching/odor/irritation, headaches, visual changes, shortness of breath, chest pain, abdominal pain, severe nausea/vomiting, or problems with urination or bowel movements unless otherwise stated above. Pertinent History Reviewed:  Reviewed past medical,surgical, social, obstetrical and family history.  Reviewed problem list, medications and allergies. Physical Assessment:   Vitals:   12/20/16 0902  BP: 110/70  Pulse: 98  Weight: 165 lb 12.8 oz (75.2 kg)  Body mass index is 28.46 kg/m.        Physical Examination:   General appearance: Well appearing, and in no distress  Mental status: Alert, oriented to person, place, and time  Skin: Warm & dry  Cardiovascular: Normal heart rate noted  Respiratory: Normal respiratory effort, no distress  Abdomen: Soft, gravid, nontender  Pelvic: Cervical exam deferred         Extremities: Edema: None  Fetal Status: Fetal Heart Rate (bpm): 150 Fundal Height: 24 cm Movement: Present    Results for orders placed or performed in visit on 12/20/16 (from the past 24 hour(s))  POCT urinalysis dipstick   Collection Time: 12/20/16  9:09 AM  Result Value Ref Range   Color, UA     Clarity, UA     Glucose, UA neg    Bilirubin, UA     Ketones, UA neg    Spec Grav, UA  1.010 - 1.025   Blood, UA neg    pH, UA  5.0 - 8.0   Protein, UA neg    Urobilinogen, UA  0.2 or 1.0 E.U./dL   Nitrite, UA neg    Leukocytes, UA Trace (A)  Negative    Assessment & Plan:  1) Low-risk pregnancy G1P0000 at 334w4d with an Estimated Date of Delivery: 04/14/17    Labs/procedures today: none  Plan:  Continue routine obstetrical care   Reviewed: Preterm labor symptoms and general obstetric precautions including but not limited to vaginal bleeding, contractions, leaking of fluid and fetal movement were reviewed in detail with the patient.  All questions were answered  Follow-up: Return in about 4 weeks (around 01/17/2017) for LROB, PN2.  Orders Placed This Encounter  Procedures  . POCT urinalysis dipstick   Marge DuncansBooker, Zayyan Mullen Randall CNM, [facility]NP-BC 12/20/2016 9:20 AM

## 2016-12-20 NOTE — Patient Instructions (Signed)
[name] Brown, I greatly value your feedback.  If you receive a survey following your visit with [name] Brown today, we appreciate you taking the time to fill it out.  Thanks, Joellyn HaffKim Marsh Heckler, CNM, WHNP-BC   You will have your sugar test next visit.  Please do not eat or drink anything after midnight the night before you come, not even water.  You will be here for at least two hours.     Call the office [phone]) or [facility] if:  You begin to have strong, frequent contractions  Your water breaks.  Sometimes it is a big gush of fluid, sometimes it is just a trickle that keeps getting your panties wet or running down your legs  You have vaginal bleeding.  It is normal to have a small amount of spotting if your cervix was checked.   You don't feel your baby moving like normal.  If you don't, get you something to eat and drink and lay down and focus on feeling your baby move.   If your baby is still not moving like normal, you should call the office or go to [facility].  Second Trimester of Pregnancy The second trimester is from week 13 through week 28, months 4 through 6. The second trimester is often a time when you feel your best. Your body has also adjusted to being pregnant, and you begin to feel better physically. Usually, morning sickness has lessened or quit completely, you may have more energy, and you may have an increase in appetite. The second trimester is also a time when the fetus is growing rapidly. At the end of the sixth month, the fetus is about 9 inches long and weighs about 1 pounds. You will likely begin to feel the baby move (quickening) between 18 and 20 weeks of the pregnancy. BODY CHANGES Your body goes through many changes during pregnancy. The changes vary from woman to woman.   Your weight will continue to increase. You will notice your lower abdomen bulging out.  You may begin to get stretch marks on your hips, abdomen, and breasts.  You may develop headaches  that can be relieved by medicines approved by your health care provider.  You may urinate more often because the fetus is pressing on your bladder.  You may develop or continue to have heartburn as a result of your pregnancy.  You may develop constipation because certain hormones are causing the muscles that push waste through your intestines to slow down.  You may develop hemorrhoids or swollen, bulging veins (varicose veins).  You may have back pain because of the weight gain and pregnancy hormones relaxing your joints between the bones in your pelvis and as a result of a shift in weight and the muscles that support your balance.  Your breasts will continue to grow and be tender.  Your gums may bleed and may be sensitive to brushing and flossing.  Dark spots or blotches (chloasma, mask of pregnancy) may develop on your face. This will likely fade after the baby is born.  A dark line from your belly button to the pubic area (linea nigra) may appear. This will likely fade after the baby is born.  You may have changes in your hair. These can include thickening of your hair, rapid growth, and changes in texture. Some women also have hair loss during or after pregnancy, or hair that feels dry or thin. Your hair will most likely return to normal after your  baby is born. WHAT TO EXPECT AT YOUR PRENATAL VISITS During a routine prenatal visit:  You will be weighed to make sure you and the fetus are growing normally.  Your blood pressure will be taken.  Your abdomen will be measured to track your baby's growth.  The fetal heartbeat will be listened to.  Any test results from the previous visit will be discussed. Your health care provider may ask you:  How you are feeling.  If you are feeling the baby move.  If you have had any abnormal symptoms, such as leaking fluid, bleeding, severe headaches, or abdominal cramping.  If you have any questions. Other tests that may be performed  during your second trimester include:  Blood tests that check for:  Low iron levels (anemia).  Gestational diabetes (between 24 and 28 weeks).  Rh antibodies.  Urine tests to check for infections, diabetes, or protein in the urine.  An ultrasound to confirm the proper growth and development of the baby.  An amniocentesis to check for possible genetic problems.  Fetal screens for spina bifida and Down syndrome. HOME CARE INSTRUCTIONS   Avoid all smoking, herbs, alcohol, and unprescribed drugs. These chemicals affect the formation and growth of the baby.  Follow your health care provider's instructions regarding medicine use. There are medicines that are either safe or unsafe to take during pregnancy.  Exercise only as directed by your health care provider. Experiencing uterine cramps is a good sign to stop exercising.  Continue to eat regular, healthy meals.  Wear a good support bra for breast tenderness.  Do not use hot tubs, steam rooms, or saunas.  Wear your seat belt at all times when driving.  Avoid raw meat, uncooked cheese, cat litter boxes, and soil used by cats. These carry germs that can cause birth defects in the baby.  Take your prenatal vitamins.  Try taking a stool softener (if your health care provider approves) if you develop constipation. Eat more high-fiber foods, such as fresh vegetables or fruit and whole grains. Drink plenty of fluids to keep your urine clear or pale yellow.  Take warm sitz baths to soothe any pain or discomfort caused by hemorrhoids. Use hemorrhoid cream if your health care provider approves.  If you develop varicose veins, wear support hose. Elevate your feet for 15 minutes, 3-4 times a day. Limit salt in your diet.  Avoid heavy lifting, wear low heel shoes, and practice good posture.  Rest with your legs elevated if you have leg cramps or low back pain.  Visit your dentist if you have not gone yet during your pregnancy. Use a soft  toothbrush to brush your teeth and be gentle when you floss.  A sexual relationship may be continued unless your health care provider directs you otherwise.  Continue to go to all your prenatal visits as directed by your health care provider. SEEK MEDICAL CARE IF:   You have dizziness.  You have mild pelvic cramps, pelvic pressure, or nagging pain in the abdominal area.  You have persistent nausea, vomiting, or diarrhea.  You have a bad smelling vaginal discharge.  You have pain with urination. SEEK IMMEDIATE MEDICAL CARE IF:   You have a fever.  You are leaking fluid from your vagina.  You have spotting or bleeding from your vagina.  You have severe abdominal cramping or pain.  You have rapid weight gain or loss.  You have shortness of breath with chest pain.  You notice sudden or extreme  swelling of your face, hands, ankles, feet, or legs.  You have not felt your baby move in over an hour.  You have severe headaches that do not go away with medicine.  You have vision changes. Document Released: 01/03/2001 Document Revised: 01/14/2013 Document Reviewed: 03/12/2012 [facility] Patient Information 2015 [place], [place]. This information is not intended to replace advice given to you by your health care provider. Make sure you discuss any questions you have with your health care provider.

## 2017-01-17 ENCOUNTER — Encounter: Payer: Self-pay | Admitting: Obstetrics and Gynecology

## 2017-01-17 ENCOUNTER — Ambulatory Visit (INDEPENDENT_AMBULATORY_CARE_PROVIDER_SITE_OTHER): Payer: Medicaid Other | Admitting: Obstetrics and Gynecology

## 2017-01-17 ENCOUNTER — Other Ambulatory Visit: Payer: Medicaid Other

## 2017-01-17 VITALS — BP 112/80 | HR 82 | Wt 175.0 lb

## 2017-01-17 DIAGNOSIS — Z3402 Encounter for supervision of normal first pregnancy, second trimester: Secondary | ICD-10-CM

## 2017-01-17 DIAGNOSIS — Z1389 Encounter for screening for other disorder: Secondary | ICD-10-CM

## 2017-01-17 DIAGNOSIS — Z3A27 27 weeks gestation of pregnancy: Secondary | ICD-10-CM

## 2017-01-17 DIAGNOSIS — Z131 Encounter for screening for diabetes mellitus: Secondary | ICD-10-CM

## 2017-01-17 DIAGNOSIS — Z331 Pregnant state, incidental: Secondary | ICD-10-CM

## 2017-01-17 LAB — POCT URINALYSIS DIPSTICK
Blood, UA: NEGATIVE
Glucose, UA: NEGATIVE
KETONES UA: NEGATIVE
NITRITE UA: NEGATIVE
PROTEIN UA: NEGATIVE

## 2017-01-17 NOTE — Progress Notes (Addendum)
   LOW-RISK PREGNANCY VISIT Patient name: [name] Brown MRN [id number]  Date of birth: 11/22/1990 Chief Complaint:   Routine Prenatal Visit (PN2 labs today, rash on chest and back)  History of Present Illness:   [name] Brown is a 26 y.o. 511P0000 female at 2417w4d with an Estimated Date of Delivery: 04/14/17 being seen today for ongoing management of a low-risk pregnancy.  Today she reports a rash that began on her back and has spread to her chest. She denies any itching. She would like a note for work that states she can wear casual shoes for work instead of dress shoes.   Contractions: Not present. Vag. Bleeding: None.  Movement: Present. denies leaking of fluid. Review of Systems:   Pertinent items are noted in HPI Denies abnormal vaginal discharge w/ itching/odor/irritation, headaches, visual changes, shortness of breath, chest pain, abdominal pain, severe nausea/vomiting, or problems with urination or bowel movements unless otherwise stated above. Pertinent History Reviewed:  Reviewed past medical,surgical, social, obstetrical and family history.  Reviewed problem list, medications and allergies. Physical Assessment:   Vitals:   01/17/17 0857  BP: 112/80  Pulse: 82  Weight: 175 lb (79.4 kg)  Body mass index is 30.04 kg/m.        Physical Examination:   General appearance: Well appearing, and in no distress  Mental status: Alert, oriented to person, place, and time  Skin: Warm & dry  Cardiovascular: Normal heart rate noted  Respiratory: Normal respiratory effort, no distress  Abdomen: Soft, gravid, nontender  Pelvic: Cervical exam deferred         Extremities: Edema: None  Fetal Status:     Movement: Present   FHR: 162 bpm, FH: 28  Results for orders placed or performed in visit on 01/17/17 (from the past 24 hour(s))  POCT urinalysis dipstick   Collection Time: 01/17/17  9:01 AM  Result Value Ref Range   Color, UA     Clarity, UA     Glucose, UA neg    Bilirubin, UA     Ketones, UA neg    Spec Grav, UA  1.010 - 1.025   Blood, UA neg    pH, UA  5.0 - 8.0   Protein, UA neg    Urobilinogen, UA  0.2 or 1.0 E.U./dL   Nitrite, UA neg    Leukocytes, UA Moderate (2+) (A) Negative   Appearance     Odor      Assessment & Plan:  1) Low-risk pregnancy G1P0000 at 2417w4d with an Estimated Date of Delivery: 04/14/17    Labs/procedures today: Doppler of fht Plan:  Continue routine obstetrical care LROB 2. Nonspecific rash chest, nonpruritic. Reviewed: Preterm labor symptoms and general obstetric precautions including but not limited to vaginal bleeding, contractions, leaking of fluid and fetal movement were reviewed in detail with the patient.  All questions were answered  Follow-up: Return in about 4 weeks (around 02/14/2017) for LROB.  Orders Placed This Encounter  Procedures  . POCT urinalysis dipstick     By signing my name below, I, Izna Ahmed, attest that this documentation has been prepared under the direction and in the presence of Tilda BurrowFerguson, Braylon Lemmons V, MD. Electronically Signed: Redge GainerIzna Ahmed, Medical Scribe. 01/17/17. 9:27 AM.  I personally performed the services described in this documentation, which was SCRIBED in my presence. The recorded information has been reviewed and considered accurate. It has been edited as necessary during review. Tilda BurrowJohn V Andreas Sobolewski, MD

## 2017-01-18 LAB — CBC
Hematocrit: 32.1 % — ABNORMAL LOW (ref 34.0–46.6)
Hemoglobin: 11 g/dL — ABNORMAL LOW (ref 11.1–15.9)
MCH: 32 pg (ref 26.6–33.0)
MCHC: 34.3 g/dL (ref 31.5–35.7)
MCV: 93 fL (ref 79–97)
PLATELETS: 336 10*3/uL (ref 150–379)
RBC: 3.44 x10E6/uL — AB (ref 3.77–5.28)
RDW: 13.4 % (ref 12.3–15.4)
WBC: 11.3 10*3/uL — ABNORMAL HIGH (ref 3.4–10.8)

## 2017-01-18 LAB — RPR: RPR: NONREACTIVE

## 2017-01-18 LAB — GLUCOSE TOLERANCE, 2 HOURS W/ 1HR
Glucose, 1 hour: 140 mg/dL (ref 65–179)
Glucose, 2 hour: 128 mg/dL (ref 65–152)
Glucose, Fasting: 80 mg/dL (ref 65–91)

## 2017-01-18 LAB — ANTIBODY SCREEN: Antibody Screen: NEGATIVE

## 2017-01-18 LAB — HIV ANTIBODY (ROUTINE TESTING W REFLEX): HIV SCREEN 4TH GENERATION: NONREACTIVE

## 2017-01-21 ENCOUNTER — Emergency Department (HOSPITAL_COMMUNITY)
Admission: EM | Admit: 2017-01-21 | Discharge: 2017-01-21 | Disposition: A | Discharge status: Other Acute Inpatient Hospital | Payer: Medicaid Other

## 2017-01-22 ENCOUNTER — Ambulatory Visit (INDEPENDENT_AMBULATORY_CARE_PROVIDER_SITE_OTHER): Payer: Medicaid Other | Admitting: Obstetrics & Gynecology

## 2017-01-22 ENCOUNTER — Encounter: Payer: Self-pay | Admitting: Obstetrics & Gynecology

## 2017-01-22 VITALS — BP 100/70 | HR 95 | Temp 98.0°F | Wt 174.4 lb

## 2017-01-22 DIAGNOSIS — J069 Acute upper respiratory infection, unspecified: Secondary | ICD-10-CM

## 2017-01-22 DIAGNOSIS — Z1389 Encounter for screening for other disorder: Secondary | ICD-10-CM | POA: Diagnosis not present

## 2017-01-22 DIAGNOSIS — Z331 Pregnant state, incidental: Secondary | ICD-10-CM | POA: Diagnosis not present

## 2017-01-22 DIAGNOSIS — B9789 Other viral agents as the cause of diseases classified elsewhere: Secondary | ICD-10-CM | POA: Diagnosis not present

## 2017-01-22 DIAGNOSIS — Z3402 Encounter for supervision of normal first pregnancy, second trimester: Secondary | ICD-10-CM

## 2017-01-22 LAB — POCT URINALYSIS DIPSTICK
Blood, UA: NEGATIVE
Glucose, UA: NEGATIVE
KETONES UA: NEGATIVE
Leukocytes, UA: NEGATIVE
NITRITE UA: NEGATIVE

## 2017-01-22 NOTE — Progress Notes (Signed)
Patient ID: [name] Brown, female   DOB: 10/07/1990, 26 y.o.   MRN: [id number]      Chief Complaint  Patient presents with  . work-in-ob    chest cold/ coughing, nasal congestion/ taking Zyrtec      26 y.o. G1P0000 Patient's last menstrual period was 07/16/2016. The current method of family planning is pt is pregnant.  Outpatient Encounter Medications as of 01/22/2017  Medication Sig  . cetirizine (ZYRTEC) 10 MG tablet Take 10 mg by mouth daily.  . flintstones complete (FLINTSTONES) 60 MG chewable tablet Chew 2 tablets by mouth daily.   No facility-administered encounter medications on file as of 01/22/2017.     Subjective Cough cold symptoms no fever nasal stuffiness Past Medical History:  Diagnosis Date  . Chronic abdominal pain   . Colitis   . Counseling for birth control, oral contraceptives 06/26/2014  . Missed period 06/26/2014    History reviewed. No pertinent surgical history.  OB History    Gravida Para Term Preterm AB Living   1 0 0 0 0 0   SAB TAB Ectopic Multiple Live Births   0 0 0 0        Allergies  Allergen Reactions  . Mineral Oil Rash    Baby oil    Social History   Socioeconomic History  . Marital status: Single    Spouse name: None  . Number of children: None  . Years of education: None  . Highest education level: None  Social Needs  . Financial resource strain: None  . Food insecurity - worry: None  . Food insecurity - inability: None  . Transportation needs - medical: None  . Transportation needs - non-medical: None  Occupational History  . None  Tobacco Use  . Smoking status: Never Smoker  . Smokeless tobacco: Never Used  Substance and Sexual Activity  . Alcohol use: No    Comment: occ  . Drug use: No  . Sexual activity: Not Currently    Birth control/protection: None  Other Topics Concern  . None  Social History Narrative  . None    Family History  Problem Relation Age of Onset  . Cancer Mother        cervical  .  Alcohol abuse Mother   . Other Father        fatty liver  . Cancer Maternal Grandmother        skin  . Dementia Maternal Grandmother   . Dementia Paternal Grandmother   . Diabetes Paternal Grandfather   . Dementia Paternal Grandfather     Medications:       Current Outpatient Medications:  .  cetirizine (ZYRTEC) 10 MG tablet, Take 10 mg by mouth daily., Disp: , Rfl:  .  flintstones complete (FLINTSTONES) 60 MG chewable tablet, Chew 2 tablets by mouth daily., Disp: , Rfl:   Objective Blood pressure 100/70, pulse 95, temperature 98 F (36.7 C), weight 174 lb 6.4 oz (79.1 kg), last menstrual period 07/16/2016.  Lungs clear normal respirations no wheezes abdomen soft gravid benign  Pertinent ROS No burning with urination, frequency or urgency No nausea, vomiting or diarrhea Nor fever chills or other constitutional symptoms   Labs or studies     Impression Diagnoses this Encounter::   ICD-10-CM   1. Viral URI with cough J06.9    B97.89   2. Encounter for supervision of normal first pregnancy in second trimester Z34.02   3. Pregnant state, incidental Z33.1 POCT urinalysis dipstick  4. Screening for genitourinary condition Z13.89 POCT urinalysis dipstick    Established relevant diagnosis(es):   Plan/Recommendations: No orders of the defined types were placed in this encounter.   Labs or Scans Ordered: Orders Placed This Encounter  Procedures  . POCT urinalysis dipstick    Management:: Alka seltzer plus day night formula delsyn or robitussin DM for cough Afrin nasal spray  Follow up Return for keep scheduled.        All questions were answered.

## 2017-01-22 NOTE — Progress Notes (Signed)
error 

## 2017-01-24 ENCOUNTER — Telehealth: Payer: Self-pay | Admitting: Women's Health

## 2017-01-24 NOTE — Telephone Encounter (Signed)
Informed patient glucola was normal.  Patient also states Dr Despina HiddenEure advised her to take alkaseltzer plus but is unsure if it's safe to take since it contains aspirin.  Informed patient it was fine to take.  Verbalized understanding.

## 2017-01-31 ENCOUNTER — Telehealth: Payer: Self-pay | Admitting: *Deleted

## 2017-01-31 MED ORDER — AMOXICILLIN-POT CLAVULANATE 875-125 MG PO TABS: 1.0000 | ORAL_TABLET | Freq: Two times a day (BID) | ORAL | 0 refills | Status: DC

## 2017-01-31 NOTE — Telephone Encounter (Signed)
Pt called and spoke w/ RN w/ following complaints:  "sinus pressure, yellow snot, ear pain, cough. States symptoms started last Friday.  Was seen last week and was diagnosed with a viral URI."   Was seen 12/31 for same, so has now been >7days since onset and not improving. Rx augmentin bid x 7d. Let us know if not improving.   Cheral MarkerKimberly R. Cledith Abdou, CNM, [facility] 01/31/2017 3:29 PM

## 2017-01-31 NOTE — Telephone Encounter (Signed)
Patient called with complaints of sinus pressure, yellow snot, ear pain, cough. States symptoms started last Friday.  Was seen last week and was diagnosed with a viral URI. Please advise.

## 2017-01-31 NOTE — Telephone Encounter (Signed)
Informed patient prescription for antibiotic was sent to pharmacy but to let us know if she does not improve.  Verbalized understanding.

## 2017-02-14 ENCOUNTER — Ambulatory Visit (INDEPENDENT_AMBULATORY_CARE_PROVIDER_SITE_OTHER): Payer: Medicaid Other | Admitting: Advanced Practice Midwife

## 2017-02-14 ENCOUNTER — Encounter: Payer: Self-pay | Admitting: Advanced Practice Midwife

## 2017-02-14 VITALS — BP 100/70 | HR 98 | Wt 181.0 lb

## 2017-02-14 DIAGNOSIS — O23599 Infection of other part of genital tract in pregnancy, unspecified trimester: Secondary | ICD-10-CM

## 2017-02-14 DIAGNOSIS — Z1389 Encounter for screening for other disorder: Secondary | ICD-10-CM

## 2017-02-14 DIAGNOSIS — Z3403 Encounter for supervision of normal first pregnancy, third trimester: Secondary | ICD-10-CM

## 2017-02-14 DIAGNOSIS — B379 Candidiasis, unspecified: Secondary | ICD-10-CM

## 2017-02-14 DIAGNOSIS — Z331 Pregnant state, incidental: Secondary | ICD-10-CM

## 2017-02-14 DIAGNOSIS — Z3A31 31 weeks gestation of pregnancy: Secondary | ICD-10-CM

## 2017-02-14 LAB — POCT URINALYSIS DIPSTICK
GLUCOSE UA: NEGATIVE
Ketones, UA: NEGATIVE
LEUKOCYTES UA: NEGATIVE
Nitrite, UA: NEGATIVE

## 2017-02-14 NOTE — Patient Instructions (Signed)
Cierrah Grant FontanaL [place]ah[place], I greatly value your feedback.  If you receive a survey following your visit with us today, we appreciate you taking the time to fill it out.  Thanks, Cathie BeamsFran Cresenzo-Dishmon, CNM   Call the office [phone]) or go to [facility] if:  You begin to have strong, frequent contractions  Your water breaks.  Sometimes it is a big gush of fluid, sometimes it is just a trickle that keeps getting your panties wet or running down your legs  You have vaginal bleeding.  It is normal to have a small amount of spotting if your cervix was checked.   You don't feel your baby moving like normal.  If you don't, get you something to eat and drink and lay down and focus on feeling your baby move.  You should feel at least 10 movements in 2 hours.  If you don't, you should call the office or [facility].    Tdap Vaccine  It is recommended that you get the Tdap vaccine during the third trimester of EACH pregnancy to help protect your baby from getting pertussis (whooping cough)  27-36 weeks is the BEST time to do this so that you can pass the protection on to your baby. During pregnancy is better than after pregnancy, but if you are unable to get it during pregnancy it will be offered at the hospital.   You can get this vaccine at the health department or your family doctor  Everyone who will be around your baby should also be up-to-date on their vaccines. Adults (who are not pregnant) only need 1 dose of Tdap during adulthood.   Third Trimester of Pregnancy The third trimester is from week 29 through week 42, months 7 through 9. The third trimester is a time when the fetus is growing rapidly. At the end of the ninth month, the fetus is about 20 inches in length and weighs 6-10 pounds.  BODY CHANGES Your body goes through many changes during pregnancy. The changes vary from woman to woman.   Your weight will continue to increase. You can expect to gain 25-35 pounds (11-16 kg) by  the end of the pregnancy.  You may begin to get stretch marks on your hips, abdomen, and breasts.  You may urinate more often because the fetus is moving lower into your pelvis and pressing on your bladder.  You may develop or continue to have heartburn as a result of your pregnancy.  You may develop constipation because certain hormones are causing the muscles that push waste through your intestines to slow down.  You may develop hemorrhoids or swollen, bulging veins (varicose veins).  You may have pelvic pain because of the weight gain and pregnancy hormones relaxing your joints between the bones in your pelvis. Backaches may result from overexertion of the muscles supporting your posture.  You may have changes in your hair. These can include thickening of your hair, rapid growth, and changes in texture. Some women also have hair loss during or after pregnancy, or hair that feels dry or thin. Your hair will most likely return to normal after your baby is born.  Your breasts will continue to grow and be tender. A yellow discharge may leak from your breasts called colostrum.  Your belly button may stick out.  You may feel short of breath because of your expanding uterus.  You may notice the fetus "dropping," or moving lower in your abdomen.  You may have a bloody mucus  discharge. This usually occurs a few days to a week before labor begins.  Your cervix becomes thin and soft (effaced) near your due date. WHAT TO EXPECT AT YOUR PRENATAL EXAMS  You will have prenatal exams every 2 weeks until week 36. Then, you will have weekly prenatal exams. During a routine prenatal visit:  You will be weighed to make sure you and the fetus are growing normally.  Your blood pressure is taken.  Your abdomen will be measured to track your baby's growth.  The fetal heartbeat will be listened to.  Any test results from the previous visit will be discussed.  You may have a cervical check near your  due date to see if you have effaced. At around 36 weeks, your caregiver will check your cervix. At the same time, your caregiver will also perform a test on the secretions of the vaginal tissue. This test is to determine if a type of bacteria, Group B streptococcus, is present. Your caregiver will explain this further. Your caregiver may ask you:  What your birth plan is.  How you are feeling.  If you are feeling the baby move.  If you have had any abnormal symptoms, such as leaking fluid, bleeding, severe headaches, or abdominal cramping.  If you have any questions. Other tests or screenings that may be performed during your third trimester include:  Blood tests that check for low iron levels (anemia).  Fetal testing to check the health, activity level, and growth of the fetus. Testing is done if you have certain medical conditions or if there are problems during the pregnancy. FALSE LABOR You may feel small, irregular contractions that eventually go away. These are called Braxton Hicks contractions, or false labor. Contractions may last for hours, days, or even weeks before true labor sets in. If contractions come at regular intervals, intensify, or become painful, it is best to be seen by your caregiver.  SIGNS OF LABOR   Menstrual-like cramps.  Contractions that are 5 minutes apart or less.  Contractions that start on the top of the uterus and spread down to the lower abdomen and back.  A sense of increased pelvic pressure or back pain.  A watery or bloody mucus discharge that comes from the vagina. If you have any of these signs before the 37th week of pregnancy, call your caregiver right away. You need to go to the hospital to get checked immediately. HOME CARE INSTRUCTIONS   Avoid all smoking, herbs, alcohol, and unprescribed drugs. These chemicals affect the formation and growth of the baby.  Follow your caregiver's instructions regarding medicine use. There are medicines  that are either safe or unsafe to take during pregnancy.  Exercise only as directed by your caregiver. Experiencing uterine cramps is a good sign to stop exercising.  Continue to eat regular, healthy meals.  Wear a good support bra for breast tenderness.  Do not use hot tubs, steam rooms, or saunas.  Wear your seat belt at all times when driving.  Avoid raw meat, uncooked cheese, cat litter boxes, and soil used by cats. These carry germs that can cause birth defects in the baby.  Take your prenatal vitamins.  Try taking a stool softener (if your caregiver approves) if you develop constipation. Eat more high-fiber foods, such as fresh vegetables or fruit and whole grains. Drink plenty of fluids to keep your urine clear or pale yellow.  Take warm sitz baths to soothe any pain or discomfort caused by hemorrhoids. Use  hemorrhoid cream if your caregiver approves.  If you develop varicose veins, wear support hose. Elevate your feet for 15 minutes, 3-4 times a day. Limit salt in your diet.  Avoid heavy lifting, wear low heal shoes, and practice good posture.  Rest a lot with your legs elevated if you have leg cramps or low back pain.  Visit your dentist if you have not gone during your pregnancy. Use a soft toothbrush to brush your teeth and be gentle when you floss.  A sexual relationship may be continued unless your caregiver directs you otherwise.  Do not travel far distances unless it is absolutely necessary and only with the approval of your caregiver.  Take prenatal classes to understand, practice, and ask questions about the labor and delivery.  Make a trial run to the hospital.  Pack your hospital bag.  Prepare the baby's nursery.  Continue to go to all your prenatal visits as directed by your caregiver. SEEK MEDICAL CARE IF:  You are unsure if you are in labor or if your water has broken.  You have dizziness.  You have mild pelvic cramps, pelvic pressure, or nagging  pain in your abdominal area.  You have persistent nausea, vomiting, or diarrhea.  You have a bad smelling vaginal discharge.  You have pain with urination. SEEK IMMEDIATE MEDICAL CARE IF:   You have a fever.  You are leaking fluid from your vagina.  You have spotting or bleeding from your vagina.  You have severe abdominal cramping or pain.  You have rapid weight loss or gain.  You have shortness of breath with chest pain.  You notice sudden or extreme swelling of your face, hands, ankles, feet, or legs.  You have not felt your baby move in over an hour.  You have severe headaches that do not go away with medicine.  You have vision changes. Document Released: 01/03/2001 Document Revised: 01/14/2013 Document Reviewed: 03/12/2012 [facility] Patient Information 2015 [place], [place]. This information is not intended to replace advice given to you by your health care provider. Make sure you discuss any questions you have with your health care provider.

## 2017-02-14 NOTE — Progress Notes (Addendum)
G1P0000 7887w4d Estimated Date of Delivery: 04/14/17  Blood pressure 100/70, pulse 98, weight 181 lb (82.1 kg), last menstrual period 07/16/2016.   BP weight and urine results all reviewed and noted.  Please refer to the obstetrical flow sheet for the fundal height and fetal heart rate documentation:  Patient reports good fetal movement, denies any bleeding and no rupture of membranes symptoms or regular contractions. Patient is without complaints. Feels better from cold, but thinks she has a yeast infetion (took augmentin)  All questions were answered.  Orders Placed This Encounter  Procedures  . POCT urinalysis dipstick    Plan:  Continued routine obstetrical care,  OTC 7 days yeast tx Return in about 2 weeks (around 02/28/2017) for LROB.

## 2017-02-28 ENCOUNTER — Other Ambulatory Visit: Payer: Self-pay

## 2017-02-28 ENCOUNTER — Ambulatory Visit (INDEPENDENT_AMBULATORY_CARE_PROVIDER_SITE_OTHER): Payer: Medicaid Other | Admitting: Advanced Practice Midwife

## 2017-02-28 ENCOUNTER — Encounter: Payer: Self-pay | Admitting: Advanced Practice Midwife

## 2017-02-28 VITALS — BP 110/78 | HR 97 | Wt 182.0 lb

## 2017-02-28 DIAGNOSIS — Z3A33 33 weeks gestation of pregnancy: Secondary | ICD-10-CM

## 2017-02-28 DIAGNOSIS — Z1389 Encounter for screening for other disorder: Secondary | ICD-10-CM

## 2017-02-28 DIAGNOSIS — O26843 Uterine size-date discrepancy, third trimester: Secondary | ICD-10-CM

## 2017-02-28 DIAGNOSIS — Z3403 Encounter for supervision of normal first pregnancy, third trimester: Secondary | ICD-10-CM

## 2017-02-28 DIAGNOSIS — Z331 Pregnant state, incidental: Secondary | ICD-10-CM

## 2017-02-28 LAB — POCT URINALYSIS DIPSTICK
Blood, UA: NEGATIVE
GLUCOSE UA: NEGATIVE
KETONES UA: NEGATIVE
Leukocytes, UA: NEGATIVE
Nitrite, UA: NEGATIVE

## 2017-02-28 NOTE — Progress Notes (Signed)
G1P0000 3762w4d Estimated Date of Delivery: 04/14/17  Blood pressure 110/78, pulse 97, weight 182 lb (82.6 kg), last menstrual period 07/16/2016.   BP weight and urine results all reviewed and noted.  Please refer to the obstetrical flow sheet for the fundal height and fetal heart rate documentation: size >dates  Patient reports good fetal movement, denies any bleeding and no rupture of membranes symptoms or regular contractions. Patient is without complaints. All questions were answered.  Orders Placed This Encounter  Procedures  . US OB Follow Up  . POCT urinalysis dipstick    Plan:  Continued routine obstetrical care,   Return in about 1 week (around 03/07/2017) for LROB, US:EFW.

## 2017-02-28 NOTE — Patient Instructions (Signed)

## 2017-03-08 ENCOUNTER — Encounter: Payer: Self-pay | Admitting: Obstetrics & Gynecology

## 2017-03-08 ENCOUNTER — Ambulatory Visit (INDEPENDENT_AMBULATORY_CARE_PROVIDER_SITE_OTHER): Payer: Medicaid Other | Admitting: Obstetrics & Gynecology

## 2017-03-08 ENCOUNTER — Ambulatory Visit (INDEPENDENT_AMBULATORY_CARE_PROVIDER_SITE_OTHER): Payer: Medicaid Other

## 2017-03-08 VITALS — BP 114/72 | HR 90 | Wt 186.0 lb

## 2017-03-08 DIAGNOSIS — O26843 Uterine size-date discrepancy, third trimester: Secondary | ICD-10-CM

## 2017-03-08 DIAGNOSIS — Z3403 Encounter for supervision of normal first pregnancy, third trimester: Secondary | ICD-10-CM

## 2017-03-08 DIAGNOSIS — Z3A34 34 weeks gestation of pregnancy: Secondary | ICD-10-CM

## 2017-03-08 DIAGNOSIS — Z331 Pregnant state, incidental: Secondary | ICD-10-CM

## 2017-03-08 DIAGNOSIS — Z1389 Encounter for screening for other disorder: Secondary | ICD-10-CM

## 2017-03-08 LAB — POCT URINALYSIS DIPSTICK
Glucose, UA: NEGATIVE
Ketones, UA: NEGATIVE
LEUKOCYTES UA: NEGATIVE
NITRITE UA: NEGATIVE
PROTEIN UA: NEGATIVE
RBC UA: NEGATIVE

## 2017-03-08 NOTE — Progress Notes (Signed)
G1P0000 645w5d Estimated Date of Delivery: 04/14/17  Blood pressure 114/72, pulse 90, weight 186 lb (84.4 kg), last menstrual period 07/16/2016.   BP weight and urine results all reviewed and noted.  Please refer to the obstetrical flow sheet for the fundal height and fetal heart rate documentation:  Patient reports good fetal movement, denies any bleeding and no rupture of membranes symptoms or regular contractions. Patient is without complaints. All questions were answered.  Orders Placed This Encounter  Procedures  . POCT Urinalysis Dipstick    Plan:  Continued routine obstetrical care, EFW 63%--->extrapolates to 3800 grams at 40 weeks  Return in about 2 weeks (around 03/22/2017) for LROB.

## 2017-03-08 NOTE — Progress Notes (Signed)
US 34+5 wks,cephalic,fhr 154 bpm,posterior pl gr 1,afi 15 cm,EFW 2646 g 63 %

## 2017-03-14 ENCOUNTER — Encounter: Payer: Medicaid Other | Admitting: Women's Health

## 2017-03-15 ENCOUNTER — Telehealth: Payer: Self-pay | Admitting: Obstetrics & Gynecology

## 2017-03-15 NOTE — Telephone Encounter (Signed)
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 She wants to work but doesn't feel she can work at the window. Will get note and pickup tomorrow.

## 2017-03-22 ENCOUNTER — Encounter: Payer: Self-pay | Admitting: Women's Health

## 2017-03-22 ENCOUNTER — Ambulatory Visit (INDEPENDENT_AMBULATORY_CARE_PROVIDER_SITE_OTHER): Payer: Medicaid Other | Admitting: Women's Health

## 2017-03-22 ENCOUNTER — Other Ambulatory Visit: Payer: Self-pay

## 2017-03-22 VITALS — BP 120/78 | HR 102 | Wt 188.0 lb

## 2017-03-22 DIAGNOSIS — O26813 Pregnancy related exhaustion and fatigue, third trimester: Secondary | ICD-10-CM

## 2017-03-22 DIAGNOSIS — Z3A36 36 weeks gestation of pregnancy: Secondary | ICD-10-CM

## 2017-03-22 DIAGNOSIS — O99013 Anemia complicating pregnancy, third trimester: Secondary | ICD-10-CM

## 2017-03-22 DIAGNOSIS — Z1389 Encounter for screening for other disorder: Secondary | ICD-10-CM

## 2017-03-22 DIAGNOSIS — D649 Anemia, unspecified: Secondary | ICD-10-CM

## 2017-03-22 DIAGNOSIS — Z331 Pregnant state, incidental: Secondary | ICD-10-CM

## 2017-03-22 DIAGNOSIS — M5432 Sciatica, left side: Secondary | ICD-10-CM

## 2017-03-22 DIAGNOSIS — Z3403 Encounter for supervision of normal first pregnancy, third trimester: Secondary | ICD-10-CM

## 2017-03-22 DIAGNOSIS — O9989 Other specified diseases and conditions complicating pregnancy, childbirth and the puerperium: Secondary | ICD-10-CM

## 2017-03-22 LAB — POCT URINALYSIS DIPSTICK
Blood, UA: NEGATIVE
Glucose, UA: NEGATIVE
Ketones, UA: NEGATIVE
Leukocytes, UA: NEGATIVE
Nitrite, UA: NEGATIVE

## 2017-03-22 LAB — POCT HEMOGLOBIN: Hemoglobin: 9.4 g/dL — AB (ref 12.2–16.2)

## 2017-03-22 MED ORDER — FERROUS SULFATE 325 (65 FE) MG PO TABS: 325.0000 mg | ORAL_TABLET | Freq: Two times a day (BID) | ORAL | 3 refills | Status: DC

## 2017-03-22 NOTE — Patient Instructions (Addendum)
[name] Brown, I greatly value your feedback.  If you receive a survey following your visit with [place] today, we appreciate you taking the time to fill it out.  Thanks, [name], CNM, WHNP-BC   Call the office [phone]) or go to [facility] if:  You begin to have strong, frequent contractions  Your water breaks.  Sometimes it is a big gush of fluid, sometimes it is just a trickle that keeps getting your panties wet or running down your legs  You have vaginal bleeding.  It is normal to have a small amount of spotting if your cervix was checked.   You don't feel your baby moving like normal.  If you don't, get you something to eat and drink and lay down and focus on feeling your baby move.  You should feel at least 10 movements in 2 hours.  If you don't, you should call the office or go to [facility].   For Dizzy Spells:   This is usually related to either your blood sugar or your blood pressure dropping  Make sure you are staying well hydrated and drinking enough water so that your urine is clear  Eat small frequent meals and snacks containing protein (meat, eggs, nuts, cheese) so that your blood sugar doesn't drop  If you do get dizzy, sit/lay down and get you something to drink and a snack containing protein- you will usually start feeling better in 10-20 minutes      Braxton Hicks Contractions Contractions of the uterus can occur throughout pregnancy, but they are not always a sign that you are in labor. You may have practice contractions called Braxton Hicks contractions. These false labor contractions are sometimes confused with true labor. What are Deberah Pelton contractions? Braxton Hicks contractions are tightening movements that occur in the muscles of the uterus before labor. Unlike true labor contractions, these contractions do not result in opening (dilation) and thinning of the cervix. Toward the end of pregnancy (32-34 weeks), Braxton Hicks contractions can  happen more often and may become stronger. These contractions are sometimes difficult to tell apart from true labor because they can be very uncomfortable. You should not feel embarrassed if you go to the hospital with false labor. Sometimes, the only way to tell if you are in true labor is for your health care provider to look for changes in the cervix. The health care provider will do a physical exam and may monitor your contractions. If you are not in true labor, the exam should show that your cervix is not dilating and your water has not broken. If there are other health problems associated with your pregnancy, it is completely safe for you to be sent home with false labor. You may continue to have Braxton Hicks contractions until you go into true labor. How to tell the difference between true labor and false labor True labor  Contractions last 30-70 seconds.  Contractions become very regular.  Discomfort is usually felt in the top of the uterus, and it spreads to the lower abdomen and low back.  Contractions do not go away with walking.  Contractions usually become more intense and increase in frequency.  The cervix dilates and gets thinner. False labor  Contractions are usually shorter and not as strong as true labor contractions.  Contractions are usually irregular.  Contractions are often felt in the front of the lower abdomen and in the groin.  Contractions may go away when you walk around or change  positions while lying down.  Contractions get weaker and are shorter-lasting as time goes on.  The cervix usually does not dilate or become thin. Follow these instructions at home:  Take over-the-counter and prescription medicines only as told by your health care provider.  Keep up with your usual exercises and follow other instructions from your health care provider.  Eat and drink lightly if you think you are going into labor.  If Braxton Hicks contractions are making you  uncomfortable: ? Change your position from lying down or resting to walking, or change from walking to resting. ? Sit and rest in a tub of warm water. ? Drink enough fluid to keep your urine pale yellow. Dehydration may cause these contractions. ? Do slow and deep breathing several times an hour.  Keep all follow-up prenatal visits as told by your health care provider. This is important. Contact a health care provider if:  You have a fever.  You have continuous pain in your abdomen. Get help right away if:  Your contractions become stronger, more regular, and closer together.  You have fluid leaking or gushing from your vagina.  You pass blood-tinged mucus (bloody show).  You have bleeding from your vagina.  You have low back pain that you never had before.  You feel your baby's head pushing down and causing pelvic pressure.  Your baby is not moving inside you as much as it used to. Summary  Contractions that occur before labor are called Braxton Hicks contractions, false labor, or practice contractions.  Braxton Hicks contractions are usually shorter, weaker, farther apart, and less regular than true labor contractions. True labor contractions usually become progressively stronger and regular and they become more frequent.  Manage discomfort from [facility] contractions by changing position, resting in a warm bath, drinking plenty of water, or practicing deep breathing. This information is not intended to replace advice given to you by your health care provider. Make sure you discuss any questions you have with your health care provider. Document Released: 05/25/2016 Document Revised: 05/25/2016 Document Reviewed: 05/25/2016 Elsevier Interactive Patient Education  2018 [employer].   Sciatica Rehab Ask your health care provider which exercises are safe for you. Do exercises exactly as told by your health care provider and adjust them as directed. It is normal to feel  mild stretching, pulling, tightness, or discomfort as you do these exercises, but you should stop right away if you feel sudden pain or your pain gets worse.Do not begin these exercises until told by your health care provider. Stretching and range of motion exercises These exercises warm up your muscles and joints and improve the movement and flexibility of your hips and your back. These exercises also help to relieve pain, numbness, and tingling. Exercise A: Sciatic nerve glide 1. Sit in a chair with your head facing down toward your chest. Place your hands behind your back. Let your shoulders slump forward. 2. Slowly straighten one of your knees while you tilt your head back as if you are looking toward the ceiling. Only straighten your leg as far as you can without making your symptoms worse. 3. Hold for __________ seconds. 4. Slowly return to the starting position. 5. Repeat with your other leg. Repeat __________ times. Complete this exercise __________ times a day. Exercise B: Knee to chest with hip adduction and internal rotation  1. Lie on your back on a firm surface with both legs straight. 2. Bend one of your knees and move it up  toward your chest until you feel a gentle stretch in your lower back and buttock. Then, move your knee toward the shoulder that is on the opposite side from your leg. ? Hold your leg in this position by holding onto the front of your knee. 3. Hold for __________ seconds. 4. Slowly return to the starting position. 5. Repeat with your other leg. Repeat __________ times. Complete this exercise __________ times a day. Exercise C: Prone extension on elbows  1. Lie on your abdomen on a firm surface. A bed may be too soft for this exercise. 2. Prop yourself up on your elbows. 3. Use your arms to help lift your chest up until you feel a gentle stretch in your abdomen and your lower back. ? This will place some of your body weight on your elbows. If this is  uncomfortable, try stacking pillows under your chest. ? Your hips should stay down, against the surface that you are lying on. Keep your hip and back muscles relaxed. 4. Hold for __________ seconds. 5. Slowly relax your upper body and return to the starting position. Repeat __________ times. Complete this exercise __________ times a day. Strengthening exercises These exercises build strength and endurance in your back. Endurance is the ability to use your muscles for a long time, even after they get tired. Exercise D: Pelvic tilt 1. Lie on your back on a firm surface. Bend your knees and keep your feet flat. 2. Tense your abdominal muscles. Tip your pelvis up toward the ceiling and flatten your lower back into the floor. ? To help with this exercise, you may place a small towel under your lower back and try to push your back into the towel. 3. Hold for __________ seconds. 4. Let your muscles relax completely before you repeat this exercise. Repeat __________ times. Complete this exercise __________ times a day. Exercise E: Alternating arm and leg raises  1. Get on your hands and knees on a firm surface. If you are on a hard floor, you may want to use padding to cushion your knees, such as an exercise mat. 2. Line up your arms and legs. Your hands should be below your shoulders, and your knees should be below your hips. 3. Lift your left leg behind you. At the same time, raise your right arm and straighten it in front of you. ? Do not lift your leg higher than your hip. ? Do not lift your arm higher than your shoulder. ? Keep your abdominal and back muscles tight. ? Keep your hips facing the ground. ? Do not arch your back. ? Keep your balance carefully, and do not hold your breath. 4. Hold for __________ seconds. 5. Slowly return to the starting position and repeat with your right leg and your left arm. Repeat __________ times. Complete this exercise __________ times a day. Posture and  body mechanics  Body mechanics refers to the movements and positions of your body while you do your daily activities. Posture is part of body mechanics. Good posture and healthy body mechanics can help to relieve stress in your body's tissues and joints. Good posture means that your spine is in its natural S-curve position (your spine is neutral), your shoulders are pulled back slightly, and your head is not tipped forward. The following are general guidelines for applying improved posture and body mechanics to your everyday activities. Standing   When standing, keep your spine neutral and your feet about hip-width apart. Keep a slight bend in  your knees. Your ears, shoulders, and hips should line up.  When you do a task in which you stand in one place for a long time, place one foot up on a stable object that is 2-4 inches (5-10 cm) high, such as a footstool. This helps keep your spine neutral. Sitting   When sitting, keep your spine neutral and keep your feet flat on the floor. Use a footrest, if necessary, and keep your thighs parallel to the floor. Avoid rounding your shoulders, and avoid tilting your head forward.  When working at a desk or a computer, keep your desk at a height where your hands are slightly lower than your elbows. Slide your chair under your desk so you are close enough to maintain good posture.  When working at a computer, place your monitor at a height where you are looking straight ahead and you do not have to tilt your head forward or downward to look at the screen. Resting   When lying down and resting, avoid positions that are most painful for you.  If you have pain with activities such as sitting, bending, stooping, or squatting (flexion-based activities), lie in a position in which your body does not bend very much. For example, avoid curling up on your side with your arms and knees near your chest (fetal position).  If you have pain with activities such as  standing for a long time or reaching with your arms (extension-based activities), lie with your spine in a neutral position and bend your knees slightly. Try the following positions: ? Lying on your side with a pillow between your knees. ? Lying on your back with a pillow under your knees. Lifting   When lifting objects, keep your feet at least shoulder-width apart and tighten your abdominal muscles.  Bend your knees and hips and keep your spine neutral. It is important to lift using the strength of your legs, not your back. Do not lock your knees straight out.  Always ask for help to lift heavy or awkward objects. This information is not intended to replace advice given to you by your health care provider. Make sure you discuss any questions you have with your health care provider. Document Released: 01/09/2005 Document Revised: 09/16/2015 Document Reviewed: 09/25/2014 Elsevier Interactive Patient Education  [employer]2018 Elsevier Inc.

## 2017-03-22 NOTE — Addendum Note (Signed)
Addended by: Cheral MarkerBOOKER, Hana Trippett R on: 03/22/2017 05:13 PM   Modules accepted: Orders

## 2017-03-22 NOTE — Progress Notes (Addendum)
LOW-RISK PREGNANCY VISIT Patient name: [name] Brown MRN [id number]  Date of birth: 10/17/1990 Chief Complaint:   Routine Prenatal Visit (gbs/gc)  History of Present Illness:   [name] Brown is a 27 y.o. 711P0000 female at 3633w5d with an Estimated Date of Delivery: 04/14/17 being seen today for ongoing management of a low-risk pregnancy.  Today she reports sciatica Lt leg only w/ bm, bumps on neck/back, red blistery/bump Lt chest. Dizzy spells. Eating a lot of ice.  Contractions: Irregular. Vag. Bleeding: None.  Movement: Present. denies leaking of fluid. Review of Systems:   Pertinent items are noted in HPI Denies abnormal vaginal discharge w/ itching/odor/irritation, headaches, visual changes, shortness of breath, chest pain, abdominal pain, severe nausea/vomiting, or problems with urination or bowel movements unless otherwise stated above. Pertinent History Reviewed:  Reviewed past medical,surgical, social, obstetrical and family history.  Reviewed problem list, medications and allergies. Physical Assessment:   Vitals:   03/22/17 1532  BP: 120/78  Pulse: (!) 102  Weight: 188 lb (85.3 kg)  Body mass index is 32.27 kg/m.        Physical Examination:   General appearance: Well appearing, and in no distress  Mental status: Alert, oriented to person, place, and time  Skin: Warm & dry; round raised red bump Lt chest  Cardiovascular: Normal heart rate noted  Respiratory: Normal respiratory effort, no distress  Abdomen: Soft, gravid, nontender  Pelvic: Cervical exam performed  Dilation: Closed Effacement (%): Thick Station: -2  Extremities: Edema: Mild pitting, slight indentation  Fetal Status: Fetal Heart Rate (bpm): 145 Fundal Height: 36 cm Movement: Present Presentation: Vertex  Results for orders placed or performed in visit on 03/22/17 (from the past 24 hour(s))  POCT urinalysis dipstick   Collection Time: 03/22/17  3:35 PM  Result Value Ref Range   Color, UA     Clarity, UA       Glucose, UA neg    Bilirubin, UA     Ketones, UA neg    Spec Grav, UA  1.010 - 1.025   Blood, UA neg    pH, UA  5.0 - 8.0   Protein, UA trace    Urobilinogen, UA  0.2 or 1.0 E.U./dL   Nitrite, UA neg    Leukocytes, UA Negative Negative   Appearance     Odor    POCT hemoglobin   Collection Time: 03/22/17  4:17 PM  Result Value Ref Range   Hemoglobin 9.4 (A) 12.2 - 16.2 g/dL    Assessment & Plan:  1) Low-risk pregnancy G1P0000 at 7333w5d with an Estimated Date of Delivery: 04/14/17   2) Round raised bump Lt chest, unsure of what this is, make next weeks appt w/ MD  3) Dizzy spells> gave printed prevention/relief measures   4) Sciatica> gave printed exercises  5) Anemia> rx fe bid, increase fe-rich foods   Meds:  Meds ordered this encounter  Medications  . ferrous sulfate 325 (65 FE) MG tablet    Sig: Take 1 tablet (325 mg total) by mouth 2 (two) times daily with a meal.    Dispense:  60 tablet    Refill:  3    Order Specific Question:   Supervising Provider    Answer:   Lazaro ArmsEURE, LUTHER H [[id number]]   Labs/procedures today: gbs, gc/ct, sve  Plan:  Continue routine obstetrical care   Reviewed: Preterm labor symptoms and general obstetric precautions including but not limited to vaginal bleeding, contractions, leaking of fluid and fetal movement  were reviewed in detail with the patient.  All questions were answered  Follow-up: Return in about 1 week (around 03/29/2017) for LROB w/ MD.  Orders Placed This Encounter  Procedures  . Culture, beta strep (group b only)  . GC/Chlamydia Probe Amp  . POCT urinalysis dipstick  . POCT hemoglobin   Cheral Marker CNM, [facility] 03/22/2017 5:12 PM

## 2017-03-22 NOTE — Addendum Note (Signed)
Addended by: Tish Frederickson A on: 03/22/2017 04:17 PM   Modules accepted: Orders

## 2017-03-24 LAB — GC/CHLAMYDIA PROBE AMP
CHLAMYDIA, DNA PROBE: NEGATIVE
Neisseria gonorrhoeae by PCR: NEGATIVE

## 2017-03-27 LAB — CULTURE, BETA STREP (GROUP B ONLY): Strep Gp B Culture: NEGATIVE

## 2017-03-29 ENCOUNTER — Ambulatory Visit (INDEPENDENT_AMBULATORY_CARE_PROVIDER_SITE_OTHER): Payer: Medicaid Other | Admitting: Women's Health

## 2017-03-29 ENCOUNTER — Encounter: Payer: Self-pay | Admitting: Women's Health

## 2017-03-29 VITALS — BP 110/80 | HR 97 | Wt 189.2 lb

## 2017-03-29 DIAGNOSIS — Z1389 Encounter for screening for other disorder: Secondary | ICD-10-CM

## 2017-03-29 DIAGNOSIS — D649 Anemia, unspecified: Secondary | ICD-10-CM

## 2017-03-29 DIAGNOSIS — O26893 Other specified pregnancy related conditions, third trimester: Secondary | ICD-10-CM

## 2017-03-29 DIAGNOSIS — O99013 Anemia complicating pregnancy, third trimester: Secondary | ICD-10-CM | POA: Diagnosis not present

## 2017-03-29 DIAGNOSIS — Z3A37 37 weeks gestation of pregnancy: Secondary | ICD-10-CM | POA: Diagnosis not present

## 2017-03-29 DIAGNOSIS — Z3403 Encounter for supervision of normal first pregnancy, third trimester: Secondary | ICD-10-CM

## 2017-03-29 DIAGNOSIS — Z331 Pregnant state, incidental: Secondary | ICD-10-CM

## 2017-03-29 DIAGNOSIS — N898 Other specified noninflammatory disorders of vagina: Secondary | ICD-10-CM

## 2017-03-29 LAB — POCT URINALYSIS DIPSTICK
Blood, UA: NEGATIVE
GLUCOSE UA: NEGATIVE
Ketones, UA: NEGATIVE
LEUKOCYTES UA: NEGATIVE
NITRITE UA: NEGATIVE

## 2017-03-29 LAB — POCT WET PREP (WET MOUNT)
Clue Cells Wet Prep Whiff POC: NEGATIVE
Trichomonas Wet Prep HPF POC: ABSENT

## 2017-03-29 MED ORDER — FERROUS SULFATE 325 (65 FE) MG PO TABS: 325.0000 mg | ORAL_TABLET | Freq: Two times a day (BID) | ORAL | 3 refills | Status: DC

## 2017-03-29 NOTE — Patient Instructions (Signed)
[name] Brown, I greatly value your feedback.  If you receive a survey following your visit with us today, we appreciate you taking the time to fill it out.  Thanks, Joellyn HaffKim Derin Matthes, CNM, WHNP-BC   Call the office [phone]) or go to [facility] if:  You begin to have strong, frequent contractions  Your water breaks.  Sometimes it is a big gush of fluid, sometimes it is just a trickle that keeps getting your panties wet or running down your legs  You have vaginal bleeding.  It is normal to have a small amount of spotting if your cervix was checked.   You don't feel your baby moving like normal.  If you don't, get you something to eat and drink and lay down and focus on feeling your baby move.  You should feel at least 10 movements in 2 hours.  If you don't, you should call the office or [facility] to [facility].     [facility] Center[facility] Contractions Contractions of the uterus can occur throughout pregnancy, but they are not always a sign that you are in labor. You may have practice contractions called Braxton Hicks contractions. These false labor contractions are sometimes confused with true labor. What are Deberah PeltonBraxton Hicks contractions? Braxton Hicks contractions are tightening movements that occur in the muscles of the uterus before labor. Unlike true labor contractions, these contractions do not result in opening (dilation) and thinning of the cervix. Toward the end of pregnancy (32-34 weeks), Braxton Hicks contractions can happen more often and may become stronger. These contractions are sometimes difficult to tell apart from true labor because they can be very uncomfortable. You should not feel embarrassed if you go to the hospital with false labor. Sometimes, the only way to tell if you are in true labor is for your health care provider to look for changes in the cervix. The health care provider will do a physical exam and may monitor your contractions. If you are not in true labor, the exam should show  that your cervix is not dilating and your water has not broken. If there are other health problems associated with your pregnancy, it is completely safe for you to be sent home with false labor. You may continue to have Braxton Hicks contractions until you go into true labor. How to tell the difference between true labor and false labor True labor  Contractions last 30-70 seconds.  Contractions become very regular.  Discomfort is usually felt in the top of the uterus, and it spreads to the lower abdomen and low back.  Contractions do not go away with walking.  Contractions usually become more intense and increase in frequency.  The cervix dilates and gets thinner. False labor  Contractions are usually shorter and not as strong as true labor contractions.  Contractions are usually irregular.  Contractions are often felt in the front of the lower abdomen and in the groin.  Contractions may go away when you walk around or change positions while lying down.  Contractions get weaker and are shorter-lasting as time goes on.  The cervix usually does not dilate or become thin. Follow these instructions at home:  Take over-the-counter and prescription medicines only as told by your health care provider.  Keep up with your usual exercises and follow other instructions from your health care provider.  Eat and drink lightly if you think you are going into labor.  If Braxton Hicks contractions are making you uncomfortable: ? Change your position from lying  down or resting to walking, or change from walking to resting. ? Sit and rest in a tub of warm water. ? Drink enough fluid to keep your urine pale yellow. Dehydration may cause these contractions. ? Do slow and deep breathing several times an hour.  Keep all follow-up prenatal visits as told by your health care provider. This is important. Contact a health care provider if:  You have a fever.  You have continuous pain in your  abdomen. Get help right away if:  Your contractions become stronger, more regular, and closer together.  You have fluid leaking or gushing from your vagina.  You pass blood-tinged mucus (bloody show).  You have bleeding from your vagina.  You have low back pain that you never had before.  You feel your baby's head pushing down and causing pelvic pressure.  Your baby is not moving inside you as much as it used to. Summary  Contractions that occur before labor are called Braxton Hicks contractions, false labor, or practice contractions.  Braxton Hicks contractions are usually shorter, weaker, farther apart, and less regular than true labor contractions. True labor contractions usually become progressively stronger and regular and they become more frequent.  Manage discomfort from [facility] contractions by changing position, resting in a warm bath, drinking plenty of water, or practicing deep breathing. This information is not intended to replace advice given to you by your health care provider. Make sure you discuss any questions you have with your health care provider. Document Released: 05/25/2016 Document Revised: 05/25/2016 Document Reviewed: 05/25/2016 Elsevier Interactive Patient Education  2018 [employer].

## 2017-03-29 NOTE — Progress Notes (Addendum)
LOW-RISK PREGNANCY VISIT Patient name: [name] Brown MRN [id number]  Date of birth: 02/27/1990 Chief Complaint:   Routine Prenatal Visit  History of Present Illness:   [name] Brown is a 27 y.o. 161P0000 female at 3053w5d with an Estimated Date of Delivery: 04/14/17 being seen today for ongoing management of a low-risk pregnancy.  Today she reports d/c w/ some itching.Dx w/ anemia last week, states pharmacy said they didn't have rx for fe.  Contractions: Not present.  .  Movement: Present. denies leaking of fluid. Review of Systems:   Pertinent items are noted in HPI Denies abnormal vaginal discharge w/ itching/odor/irritation, headaches, visual changes, shortness of breath, chest pain, abdominal pain, severe nausea/vomiting, or problems with urination or bowel movements unless otherwise stated above. Pertinent History Reviewed:  Reviewed past medical,surgical, social, obstetrical and family history.  Reviewed problem list, medications and allergies. Physical Assessment:   Vitals:   03/29/17 1544  BP: 110/80  Pulse: 97  Weight: 189 lb 3.2 oz (85.8 kg)  Body mass index is 32.48 kg/m.        Physical Examination:   General appearance: Well appearing, and in no distress  Mental status: Alert, oriented to person, place, and time  Skin: Warm & dry  Cardiovascular: Normal heart rate noted  Respiratory: Normal respiratory effort, no distress  Abdomen: Soft, gravid, nontender  Pelvic: Cervical exam performed  Dilation: Closed Effacement (%): Thick Station: -2  Normal nonodorous vaginal d/c  Extremities: Edema: Mild pitting, slight indentation  Fetal Status: Fetal Heart Rate (bpm): 158 Fundal Height: 38 cm Movement: Present Presentation: Vertex  Results for orders placed or performed in visit on 03/29/17 (from the past 24 hour(s))  POCT urinalysis dipstick   Collection Time: 03/29/17  3:52 PM  Result Value Ref Range   Color, UA     Clarity, UA     Glucose, UA neg    Bilirubin, UA     Ketones, UA neg    Spec Grav, UA  1.010 - 1.025   Blood, UA neg    pH, UA  5.0 - 8.0   Protein, UA trace    Urobilinogen, UA  0.2 or 1.0 E.U./dL   Nitrite, UA neg    Leukocytes, UA Negative Negative   Appearance     Odor    POCT Wet Prep Mellody Drown([place])   Collection Time: 03/29/17  4:46 PM  Result Value Ref Range   Source Wet Prep POC vaginal    WBC, Wet Prep HPF POC few    Bacteria Wet Prep HPF POC Few Few   BACTERIA WET PREP MORPHOLOGY POC     Clue Cells Wet Prep HPF POC None None   Clue Cells Wet Prep Whiff POC Negative Whiff    Yeast Wet Prep HPF POC None    KOH Wet Prep POC     Trichomonas Wet Prep HPF POC Absent Absent    Assessment & Plan:  1) Low-risk pregnancy G1P0000 at 3153w5d with an Estimated Date of Delivery: 04/14/17   2) Normal vaginal discharge  3) Mild anemia> re-sent rx for Fe BID, increase fe-rich foods   Meds:  Meds ordered this encounter  Medications  . ferrous sulfate 325 (65 FE) MG tablet    Sig: Take 1 tablet (325 mg total) by mouth 2 (two) times daily with a meal.    Dispense:  60 tablet    Refill:  3    Order Specific Question:   Supervising Provider  Answer:   Lazaro Arms [[id number]]   Labs/procedures today: wet prep, sve  Plan:  Continue routine obstetrical care   Reviewed: Term labor symptoms and general obstetric precautions including but not limited to vaginal bleeding, contractions, leaking of fluid and fetal movement were reviewed in detail with the patient.  All questions were answered  Follow-up: Return in about 1 week (around 04/05/2017) for LROB w/ MD.  Orders Placed This Encounter  Procedures  . POCT urinalysis dipstick  . POCT Wet Prep [facility])   Cheral Marker CNM, [facility] 03/29/2017 4:49 PM

## 2017-03-29 NOTE — Addendum Note (Signed)
Addended by: Cheral MarkerBOOKER, Izora Benn R on: 03/29/2017 04:49 PM   Modules accepted: Orders

## 2017-04-05 ENCOUNTER — Ambulatory Visit (INDEPENDENT_AMBULATORY_CARE_PROVIDER_SITE_OTHER): Payer: Medicaid Other | Admitting: Obstetrics & Gynecology

## 2017-04-05 ENCOUNTER — Encounter: Payer: Self-pay | Admitting: Obstetrics & Gynecology

## 2017-04-05 VITALS — BP 112/70 | HR 83 | Wt 193.0 lb

## 2017-04-05 DIAGNOSIS — Z331 Pregnant state, incidental: Secondary | ICD-10-CM

## 2017-04-05 DIAGNOSIS — Z3A38 38 weeks gestation of pregnancy: Secondary | ICD-10-CM

## 2017-04-05 DIAGNOSIS — Z1389 Encounter for screening for other disorder: Secondary | ICD-10-CM

## 2017-04-05 DIAGNOSIS — Z3403 Encounter for supervision of normal first pregnancy, third trimester: Secondary | ICD-10-CM

## 2017-04-05 LAB — POCT URINALYSIS DIPSTICK
Blood, UA: NEGATIVE
Glucose, UA: NEGATIVE
KETONES UA: NEGATIVE
Leukocytes, UA: NEGATIVE
NITRITE UA: NEGATIVE

## 2017-04-05 NOTE — Progress Notes (Signed)
G1P0000 2128w5d Estimated Date of Delivery: 04/14/17  Blood pressure 112/70, pulse 83, weight 193 lb (87.5 kg), last menstrual period 07/16/2016.   BP weight and urine results all reviewed and noted.  Please refer to the obstetrical flow sheet for the fundal height and fetal heart rate documentation:  Patient reports good fetal movement, denies any bleeding and no rupture of membranes symptoms or regular contractions. Patient is without complaints. All questions were answered.  Orders Placed This Encounter  Procedures  . POCT Urinalysis Dipstick    Plan:  Continued routine obstetrical care, hemangioma of skin over left breast, not a breast cyst  Will address after delviery should resolve spontaneously  Return in about 1 week (around 04/12/2017) for LROB.

## 2017-04-12 ENCOUNTER — Telehealth (HOSPITAL_COMMUNITY): Payer: Self-pay | Admitting: *Deleted

## 2017-04-12 ENCOUNTER — Encounter: Payer: Self-pay | Admitting: Women's Health

## 2017-04-12 ENCOUNTER — Ambulatory Visit (INDEPENDENT_AMBULATORY_CARE_PROVIDER_SITE_OTHER): Payer: Medicaid Other | Admitting: Women's Health

## 2017-04-12 ENCOUNTER — Inpatient Hospital Stay (HOSPITAL_COMMUNITY)
Admission: AD | Admit: 2017-04-12 | Discharge: 2017-04-16 | DRG: 788 | Disposition: A | Discharge status: Home / Self Care | Payer: Medicaid Other | Source: Ambulatory Visit | Attending: Obstetrics & Gynecology | Admitting: Obstetrics & Gynecology

## 2017-04-12 ENCOUNTER — Encounter (HOSPITAL_COMMUNITY): Payer: Self-pay

## 2017-04-12 ENCOUNTER — Encounter (HOSPITAL_COMMUNITY): Payer: Self-pay | Admitting: *Deleted

## 2017-04-12 VITALS — BP 118/74 | HR 86 | Wt 191.0 lb

## 2017-04-12 DIAGNOSIS — O134 Gestational [pregnancy-induced] hypertension without significant proteinuria, complicating childbirth: Secondary | ICD-10-CM | POA: Diagnosis present

## 2017-04-12 DIAGNOSIS — O9902 Anemia complicating childbirth: Secondary | ICD-10-CM | POA: Diagnosis present

## 2017-04-12 DIAGNOSIS — Z3A39 39 weeks gestation of pregnancy: Secondary | ICD-10-CM

## 2017-04-12 DIAGNOSIS — D649 Anemia, unspecified: Secondary | ICD-10-CM | POA: Diagnosis present

## 2017-04-12 DIAGNOSIS — O4290 Premature rupture of membranes, unspecified as to length of time between rupture and onset of labor, unspecified weeks of gestation: Secondary | ICD-10-CM | POA: Diagnosis present

## 2017-04-12 DIAGNOSIS — Z331 Pregnant state, incidental: Secondary | ICD-10-CM

## 2017-04-12 DIAGNOSIS — O4292 Full-term premature rupture of membranes, unspecified as to length of time between rupture and onset of labor: Secondary | ICD-10-CM | POA: Diagnosis present

## 2017-04-12 DIAGNOSIS — Z3400 Encounter for supervision of normal first pregnancy, unspecified trimester: Secondary | ICD-10-CM

## 2017-04-12 DIAGNOSIS — Z3403 Encounter for supervision of normal first pregnancy, third trimester: Secondary | ICD-10-CM

## 2017-04-12 DIAGNOSIS — E669 Obesity, unspecified: Secondary | ICD-10-CM | POA: Diagnosis present

## 2017-04-12 DIAGNOSIS — O99214 Obesity complicating childbirth: Secondary | ICD-10-CM | POA: Diagnosis present

## 2017-04-12 DIAGNOSIS — O480 Post-term pregnancy: Secondary | ICD-10-CM

## 2017-04-12 DIAGNOSIS — O99019 Anemia complicating pregnancy, unspecified trimester: Secondary | ICD-10-CM

## 2017-04-12 DIAGNOSIS — Z1389 Encounter for screening for other disorder: Secondary | ICD-10-CM

## 2017-04-12 DIAGNOSIS — Z98891 History of uterine scar from previous surgery: Secondary | ICD-10-CM

## 2017-04-12 HISTORY — DX: Other specified health status: Z78.9

## 2017-04-12 LAB — CBC
HCT: 30.2 % — ABNORMAL LOW (ref 36.0–46.0)
HEMOGLOBIN: 9.6 g/dL — AB (ref 12.0–15.0)
MCH: 25.7 pg — ABNORMAL LOW (ref 26.0–34.0)
MCHC: 31.8 g/dL (ref 30.0–36.0)
MCV: 81 fL (ref 78.0–100.0)
Platelets: 384 10*3/uL (ref 150–400)
RBC: 3.73 MIL/uL — ABNORMAL LOW (ref 3.87–5.11)
RDW: 15.6 % — ABNORMAL HIGH (ref 11.5–15.5)
WBC: 18.2 10*3/uL — AB (ref 4.0–10.5)

## 2017-04-12 LAB — POCT FERN TEST: POCT FERN TEST: POSITIVE

## 2017-04-12 LAB — POCT URINALYSIS DIPSTICK
GLUCOSE UA: NEGATIVE
KETONES UA: NEGATIVE
Leukocytes, UA: NEGATIVE
Nitrite, UA: NEGATIVE
RBC UA: NEGATIVE

## 2017-04-12 LAB — TYPE AND SCREEN
ABO/RH(D): O POS
ANTIBODY SCREEN: NEGATIVE

## 2017-04-12 MED ORDER — LACTATED RINGERS IV SOLN
INTRAVENOUS | Status: DC
  Administered 2017-04-12 – 2017-04-13 (×2): via INTRAVENOUS

## 2017-04-12 MED ORDER — LIDOCAINE HCL (PF) 1 % IJ SOLN: 30.0000 mL | INTRAMUSCULAR | Status: DC | PRN

## 2017-04-12 MED ORDER — HYDROXYZINE HCL 50 MG PO TABS: 50.0000 mg | ORAL_TABLET | Freq: Four times a day (QID) | ORAL | Status: DC | PRN

## 2017-04-12 MED ORDER — FLEET ENEMA 7-19 GM/118ML RE ENEM: 1.0000 | ENEMA | RECTAL | Status: DC | PRN

## 2017-04-12 MED ORDER — MISOPROSTOL 25 MCG QUARTER TABLET: 25.0000 ug | ORAL_TABLET | ORAL | Status: DC | PRN

## 2017-04-12 MED ORDER — OXYCODONE-ACETAMINOPHEN 5-325 MG PO TABS: 1.0000 | ORAL_TABLET | ORAL | Status: DC | PRN

## 2017-04-12 MED ORDER — SOD CITRATE-CITRIC ACID 500-334 MG/5ML PO SOLN
30.0000 mL | ORAL | Status: DC | PRN
  Filled 2017-04-12: qty 15

## 2017-04-12 MED ORDER — ACETAMINOPHEN 325 MG PO TABS: 650.0000 mg | ORAL_TABLET | ORAL | Status: DC | PRN

## 2017-04-12 MED ORDER — ONDANSETRON HCL 4 MG/2ML IJ SOLN: 4.0000 mg | Freq: Four times a day (QID) | INTRAMUSCULAR | Status: DC | PRN

## 2017-04-12 MED ORDER — FENTANYL CITRATE (PF) 100 MCG/2ML IJ SOLN
50.0000 ug | INTRAMUSCULAR | Status: DC | PRN
  Administered 2017-04-12: 100 ug via INTRAVENOUS
  Filled 2017-04-12: qty 2

## 2017-04-12 MED ORDER — OXYTOCIN BOLUS FROM INFUSION: 500.0000 mL | Freq: Once | INTRAVENOUS | Status: DC

## 2017-04-12 MED ORDER — OXYTOCIN 40 UNITS IN LACTATED RINGERS INFUSION - SIMPLE MED: 2.5000 [IU]/h | INTRAVENOUS | Status: DC

## 2017-04-12 MED ORDER — LACTATED RINGERS IV SOLN: 500.0000 mL | INTRAVENOUS | Status: DC | PRN

## 2017-04-12 MED ORDER — TERBUTALINE SULFATE 1 MG/ML IJ SOLN: 0.2500 mg | Freq: Once | INTRAMUSCULAR | Status: DC | PRN

## 2017-04-12 MED ORDER — OXYCODONE-ACETAMINOPHEN 5-325 MG PO TABS: 2.0000 | ORAL_TABLET | ORAL | Status: DC | PRN

## 2017-04-12 NOTE — Patient Instructions (Addendum)
[name] Brown, I greatly value your feedback.  If you receive a survey following your visit with [place] today, we appreciate you taking the time to fill it out.  Thanks, [name] Brown, CNM, WHNP-BC  Your induction is scheduled for 3/29 @ 11:45pm. Go to [facility] hospital, Maternity Admissions Unit (Emergency) entrance and let them know you are there to be induced. They will send someone from Labor & Delivery to come get you.     Call the office [phone]) or go to [facility] if:  You begin to have strong, frequent contractions  Your water breaks.  Sometimes it is a big gush of fluid, sometimes it is just a trickle that keeps getting your panties wet or running down your legs  You have vaginal bleeding.  It is normal to have a small amount of spotting if your cervix was checked.   You don't feel your baby moving like normal.  If you don't, get you something to eat and drink and lay down and focus on feeling your baby move.  You should feel at least 10 movements in 2 hours.  If you don't, you should call the office or go to [facility].     [facility] Contractions Contractions of the uterus can occur throughout pregnancy, but they are not always a sign that you are in labor. You may have practice contractions called Braxton Hicks contractions. These false labor contractions are sometimes confused with true labor. What are [name] Brown contractions? Braxton Hicks contractions are tightening movements that occur in the muscles of the uterus before labor. Unlike true labor contractions, these contractions do not result in opening (dilation) and thinning of the cervix. Toward the end of pregnancy (32-34 weeks), Braxton Hicks contractions can happen more often and may become stronger. These contractions are sometimes difficult to tell apart from true labor because they can be very uncomfortable. You should not feel embarrassed if you go to the hospital with false labor. Sometimes, the only way to  tell if you are in true labor is for your health care provider to look for changes in the cervix. The health care provider will do a physical exam and may monitor your contractions. If you are not in true labor, the exam should show that your cervix is not dilating and your water has not broken. If there are other health problems associated with your pregnancy, it is completely safe for you to be sent home with false labor. You may continue to have Braxton Hicks contractions until you go into true labor. How to tell the difference between true labor and false labor True labor  Contractions last 30-70 seconds.  Contractions become very regular.  Discomfort is usually felt in the top of the uterus, and it spreads to the lower abdomen and low back.  Contractions do not go away with walking.  Contractions usually become more intense and increase in frequency.  The cervix dilates and gets thinner. False labor  Contractions are usually shorter and not as strong as true labor contractions.  Contractions are usually irregular.  Contractions are often felt in the front of the lower abdomen and in the groin.  Contractions may go away when you walk around or change positions while lying down.  Contractions get weaker and are shorter-lasting as time goes on.  The cervix usually does not dilate or become thin. Follow these instructions at home:  Take over-the-counter and prescription medicines only as told by your health care provider.  Keep  up with your usual exercises and follow other instructions from your health care provider.  Eat and drink lightly if you think you are going into labor.  If Braxton Hicks contractions are making you uncomfortable: ? Change your position from lying down or resting to walking, or change from walking to resting. ? Sit and rest in a tub of warm water. ? Drink enough fluid to keep your urine pale yellow. Dehydration may cause these contractions. ? Do slow  and deep breathing several times an hour.  Keep all follow-up prenatal visits as told by your health care provider. This is important. Contact a health care provider if:  You have a fever.  You have continuous pain in your abdomen. Get help right away if:  Your contractions become stronger, more regular, and closer together.  You have fluid leaking or gushing from your vagina.  You pass blood-tinged mucus (bloody show).  You have bleeding from your vagina.  You have low back pain that you never had before.  You feel your baby's head pushing down and causing pelvic pressure.  Your baby is not moving inside you as much as it used to. Summary  Contractions that occur before labor are called Braxton Hicks contractions, false labor, or practice contractions.  Braxton Hicks contractions are usually shorter, weaker, farther apart, and less regular than true labor contractions. True labor contractions usually become progressively stronger and regular and they become more frequent.  Manage discomfort from [facility] contractions by changing position, resting in a warm bath, drinking plenty of water, or practicing deep breathing. This information is not intended to replace advice given to you by your health care provider. Make sure you discuss any questions you have with your health care provider. Document Released: 05/25/2016 Document Revised: 05/25/2016 Document Reviewed: 05/25/2016 Elsevier Interactive Patient Education  2018 [employer] [name] Brown, I greatly value your feedback.  If you receive a survey following your visit with us today, we appreciate you taking the time to fill it out.  Thanks, [name] HaffKim Maeven Brown, CNM, WHNP-BC   Call the office [phone]) or go to [facility] if:  You begin to have strong, frequent contractions  Your water breaks.  Sometimes it is a big gush of fluid, sometimes it is just a trickle that keeps getting your panties wet or running down your  legs  You have vaginal bleeding.  It is normal to have a small amount of spotting if your cervix was checked.   You don't feel your baby moving like normal.  If you don't, get you something to eat and drink and lay down and focus on feeling your baby move.  You should feel at least 10 movements in 2 hours.  If you don't, you should call the office or [facility] to [facility].     [facility] Contractions of the uterus can occur throughout pregnancy, but they are not always a sign that you are in labor. You may have practice contractions called Braxton Hicks contractions. These false labor contractions are sometimes confused with true labor. What are [name] PeltonBraxton Hicks contractions? Braxton Hicks contractions are tightening movements that occur in the muscles of the uterus before labor. Unlike true labor contractions, these contractions do not result in opening (dilation) and thinning of the cervix. Toward the end of pregnancy (32-34 weeks), Braxton Hicks contractions can happen more often and may become stronger. These contractions are sometimes difficult to tell apart from true labor because they can be very uncomfortable.  You should not feel embarrassed if you go to the hospital with false labor. Sometimes, the only way to tell if you are in true labor is for your health care provider to look for changes in the cervix. The health care provider will do a physical exam and may monitor your contractions. If you are not in true labor, the exam should show that your cervix is not dilating and your water has not broken. If there are other health problems associated with your pregnancy, it is completely safe for you to be sent home with false labor. You may continue to have Braxton Hicks contractions until you go into true labor. How to tell the difference between true labor and false labor True labor  Contractions last 30-70 seconds.  Contractions become very regular.  Discomfort is usually  felt in the top of the uterus, and it spreads to the lower abdomen and low back.  Contractions do not go away with walking.  Contractions usually become more intense and increase in frequency.  The cervix dilates and gets thinner. False labor  Contractions are usually shorter and not as strong as true labor contractions.  Contractions are usually irregular.  Contractions are often felt in the front of the lower abdomen and in the groin.  Contractions may go away when you walk around or change positions while lying down.  Contractions get weaker and are shorter-lasting as time goes on.  The cervix usually does not dilate or become thin. Follow these instructions at home:  Take over-the-counter and prescription medicines only as told by your health care provider.  Keep up with your usual exercises and follow other instructions from your health care provider.  Eat and drink lightly if you think you are going into labor.  If Braxton Hicks contractions are making you uncomfortable: ? Change your position from lying down or resting to walking, or change from walking to resting. ? Sit and rest in a tub of warm water. ? Drink enough fluid to keep your urine pale yellow. Dehydration may cause these contractions. ? Do slow and deep breathing several times an hour.  Keep all follow-up prenatal visits as told by your health care provider. This is important. Contact a health care provider if:  You have a fever.  You have continuous pain in your abdomen. Get help right away if:  Your contractions become stronger, more regular, and closer together.  You have fluid leaking or gushing from your vagina.  You pass blood-tinged mucus (bloody show).  You have bleeding from your vagina.  You have low back pain that you never had before.  You feel your baby's head pushing down and causing pelvic pressure.  Your baby is not moving inside you as much as it used  to. Summary  Contractions that occur before labor are called Braxton Hicks contractions, false labor, or practice contractions.  Braxton Hicks contractions are usually shorter, weaker, farther apart, and less regular than true labor contractions. True labor contractions usually become progressively stronger and regular and they become more frequent.  Manage discomfort from [facility] contractions by changing position, resting in a warm bath, drinking plenty of water, or practicing deep breathing. This information is not intended to replace advice given to you by your health care provider. Make sure you discuss any questions you have with your health care provider. Document Released: 05/25/2016 Document Revised: 05/25/2016 Document Reviewed: 05/25/2016 Elsevier Interactive Patient Education  2018 [employer]. [name] August, I greatly value your  feedback.  If you receive a survey following your visit with [place] today, we appreciate you taking the time to fill it out.  Thanks, [name] Brown, CNM, WHNP-BC   Call the office [phone]) or go to [facility] if:  You begin to have strong, frequent contractions  Your water breaks.  Sometimes it is a big gush of fluid, sometimes it is just a trickle that keeps getting your panties wet or running down your legs  You have vaginal bleeding.  It is normal to have a small amount of spotting if your cervix was checked.   You don't feel your baby moving like normal.  If you don't, get you something to eat and drink and lay down and focus on feeling your baby move.  You should feel at least 10 movements in 2 hours.  If you don't, you should call the office or go to [facility].     [facility] Contractions Contractions of the uterus can occur throughout pregnancy, but they are not always a sign that you are in labor. You may have practice contractions called Braxton Hicks contractions. These false labor contractions are sometimes confused with  true labor. What are [name] Brown contractions? Braxton Hicks contractions are tightening movements that occur in the muscles of the uterus before labor. Unlike true labor contractions, these contractions do not result in opening (dilation) and thinning of the cervix. Toward the end of pregnancy (32-34 weeks), Braxton Hicks contractions can happen more often and may become stronger. These contractions are sometimes difficult to tell apart from true labor because they can be very uncomfortable. You should not feel embarrassed if you go to the hospital with false labor. Sometimes, the only way to tell if you are in true labor is for your health care provider to look for changes in the cervix. The health care provider will do a physical exam and may monitor your contractions. If you are not in true labor, the exam should show that your cervix is not dilating and your water has not broken. If there are other health problems associated with your pregnancy, it is completely safe for you to be sent home with false labor. You may continue to have Braxton Hicks contractions until you go into true labor. How to tell the difference between true labor and false labor True labor  Contractions last 30-70 seconds.  Contractions become very regular.  Discomfort is usually felt in the top of the uterus, and it spreads to the lower abdomen and low back.  Contractions do not go away with walking.  Contractions usually become more intense and increase in frequency.  The cervix dilates and gets thinner. False labor  Contractions are usually shorter and not as strong as true labor contractions.  Contractions are usually irregular.  Contractions are often felt in the front of the lower abdomen and in the groin.  Contractions may go away when you walk around or change positions while lying down.  Contractions get weaker and are shorter-lasting as time goes on.  The cervix usually does not dilate or become  thin. Follow these instructions at home:  Take over-the-counter and prescription medicines only as told by your health care provider.  Keep up with your usual exercises and follow other instructions from your health care provider.  Eat and drink lightly if you think you are going into labor.  If Braxton Hicks contractions are making you uncomfortable: ? Change your position from lying down or resting to walking, or  change from walking to resting. ? Sit and rest in a tub of warm water. ? Drink enough fluid to keep your urine pale yellow. Dehydration may cause these contractions. ? Do slow and deep breathing several times an hour.  Keep all follow-up prenatal visits as told by your health care provider. This is important. Contact a health care provider if:  You have a fever.  You have continuous pain in your abdomen. Get help right away if:  Your contractions become stronger, more regular, and closer together.  You have fluid leaking or gushing from your vagina.  You pass blood-tinged mucus (bloody show).  You have bleeding from your vagina.  You have low back pain that you never had before.  You feel your baby's head pushing down and causing pelvic pressure.  Your baby is not moving inside you as much as it used to. Summary  Contractions that occur before labor are called Braxton Hicks contractions, false labor, or practice contractions.  Braxton Hicks contractions are usually shorter, weaker, farther apart, and less regular than true labor contractions. True labor contractions usually become progressively stronger and regular and they become more frequent.  Manage discomfort from [facility] contractions by changing position, resting in a warm bath, drinking plenty of water, or practicing deep breathing. This information is not intended to replace advice given to you by your health care provider. Make sure you discuss any questions you have with your health care  provider. Document Released: 05/25/2016 Document Revised: 05/25/2016 Document Reviewed: 05/25/2016 Elsevier Interactive Patient Education  2018 [employer].

## 2017-04-12 NOTE — Telephone Encounter (Signed)
Preadmission screen  

## 2017-04-12 NOTE — Treatment Plan (Signed)
Induction Assessment Scheduling Form Fax to [facility]:  [phone]  [name] AugustCrystal L Nickolas                                                                                   DOB:  02/08/1990                                                            MRN:  [id number]                                                                     Phone #:   [phone]                         Provider:  [facility]  GP:  G1P0000                                                            Estimated Date of Delivery: 04/14/17  Dating Criteria: 8wk u/s    Medical Indications for induction:  postdates Admission Date/Time:  3/30 @ [place] Gestational age on admission:  41.0   Filed Weights   04/12/17 1016  Weight: 191 lb (86.6 kg)   HIV:  Non Reactive (12/26 0842) GBS:  neg  1/50/-2, vtx   Method of induction(proposed):  cytotec/FB   Scheduling Provider Signature:  Cheral MarkerKimberly R Jacie Tristan, CNM                                            Today's Date:  04/12/2017

## 2017-04-12 NOTE — MAU Note (Signed)
Pt present to MAU c/o SROM @2000 . Pt reports clear fluid. Pt ctx every 3-5 min. +FM.

## 2017-04-12 NOTE — Progress Notes (Signed)
   LOW-RISK PREGNANCY VISIT Patient name: [name] Brown MRN [id number]  Date of birth: 05/19/1990 Chief Complaint:   Routine Prenatal Visit  History of Present Illness:   [name] Brown is a 27 y.o. 441P0000 female at 4850w5d with an Estimated Date of Delivery: 04/14/17 being seen today for ongoing management of a low-risk pregnancy.  Today she reports no complaints. Contractions: Not present. Vag. Bleeding: None.  Movement: Present. denies leaking of fluid. Review of Systems:   Pertinent items are noted in HPI Denies abnormal vaginal discharge w/ itching/odor/irritation, headaches, visual changes, shortness of breath, chest pain, abdominal pain, severe nausea/vomiting, or problems with urination or bowel movements unless otherwise stated above. Pertinent History Reviewed:  Reviewed past medical,surgical, social, obstetrical and family history.  Reviewed problem list, medications and allergies. Physical Assessment:   Vitals:   04/12/17 1016  BP: 118/74  Pulse: 86  Weight: 191 lb (86.6 kg)  Body mass index is 32.79 kg/m.        Physical Examination:   General appearance: Well appearing, and in no distress  Mental status: Alert, oriented to person, place, and time  Skin: Warm & dry  Cardiovascular: Normal heart rate noted  Respiratory: Normal respiratory effort, no distress  Abdomen: Soft, gravid, nontender  Pelvic: Cervical exam performed  Dilation: 1 Effacement (%): 50 Station: -2  Extremities: Edema: Trace  Fetal Status: Fetal Heart Rate (bpm): 141 Fundal Height: 39 cm Movement: Present Presentation: Vertex  Offered membrane sweeping, discussed r/b- pt decided to proceed, so membranes swept.   Results for orders placed or performed in visit on 04/12/17 (from the past 24 hour(s))  POCT Urinalysis Dipstick   Collection Time: 04/12/17 10:22 AM  Result Value Ref Range   Color, UA     Clarity, UA     Glucose, UA neg    Bilirubin, UA     Ketones, UA neg    Spec Grav, UA  1.010 -  1.025   Blood, UA neg    pH, UA  5.0 - 8.0   Protein, UA trace    Urobilinogen, UA  0.2 or 1.0 E.U./dL   Nitrite, UA neg    Leukocytes, UA Negative Negative   Appearance     Odor      Assessment & Plan:  1) Low-risk pregnancy G1P0000 at 1350w5d with an Estimated Date of Delivery: 04/14/17    Meds: No orders of the defined types were placed in this encounter.  Labs/procedures today: sve, membrane sweep  Plan:  Continue routine obstetrical care, IOL scheduled for 3/30 @ [facility] for [facility],  IOL form faxed via Epic and orders placed   Reviewed: Term labor symptoms and general obstetric precautions including but not limited to vaginal bleeding, contractions, leaking of fluid and fetal movement were reviewed in detail with the patient.  All questions were answered  Follow-up: Return in about 5 days (around 04/17/2017) for LROB, US:BPP.  Orders Placed This Encounter  Procedures  . US FETAL BPP WO NON STRESS  . POCT Urinalysis Dipstick   Cheral MarkerKimberly R Booker CNM, [facility]NP-BC 04/12/2017 10:55 AM

## 2017-04-12 NOTE — Addendum Note (Signed)
Addended by: Shawna ClampBOOKER, Murphy Duzan R on: 04/12/2017 05:07 PM   Modules accepted: Orders, SmartSet

## 2017-04-13 ENCOUNTER — Inpatient Hospital Stay (HOSPITAL_COMMUNITY): Payer: Medicaid Other | Admitting: Anesthesiology

## 2017-04-13 ENCOUNTER — Encounter (HOSPITAL_COMMUNITY)
Admission: AD | Disposition: A | Discharge status: Home / Self Care | Payer: Self-pay | Source: Ambulatory Visit | Attending: Obstetrics & Gynecology

## 2017-04-13 ENCOUNTER — Encounter (HOSPITAL_COMMUNITY): Payer: Self-pay | Admitting: Anesthesiology

## 2017-04-13 DIAGNOSIS — Z3A39 39 weeks gestation of pregnancy: Secondary | ICD-10-CM

## 2017-04-13 DIAGNOSIS — O99019 Anemia complicating pregnancy, unspecified trimester: Secondary | ICD-10-CM

## 2017-04-13 LAB — PROTEIN / CREATININE RATIO, URINE
Creatinine, Urine: 146 mg/dL
Protein Creatinine Ratio: 0.31 mg/mg{Cre} — ABNORMAL HIGH (ref 0.00–0.15)
TOTAL PROTEIN, URINE: 45 mg/dL

## 2017-04-13 LAB — COMPREHENSIVE METABOLIC PANEL
ALT: 16 U/L (ref 14–54)
AST: 23 U/L (ref 15–41)
Albumin: 2.5 g/dL — ABNORMAL LOW (ref 3.5–5.0)
Alkaline Phosphatase: 184 U/L — ABNORMAL HIGH (ref 38–126)
Anion gap: 9 (ref 5–15)
BUN: 9 mg/dL (ref 6–20)
CHLORIDE: 108 mmol/L (ref 101–111)
CO2: 16 mmol/L — AB (ref 22–32)
CREATININE: 0.61 mg/dL (ref 0.44–1.00)
Calcium: 9 mg/dL (ref 8.9–10.3)
GFR calc Af Amer: >60 mL/min (ref >60)
GFR calc non Af Amer: >60 mL/min (ref >60)
Glucose, Bld: 104 mg/dL — ABNORMAL HIGH (ref 65–99)
POTASSIUM: 3.9 mmol/L (ref 3.5–5.1)
Sodium: 133 mmol/L — ABNORMAL LOW (ref 135–145)
Total Bilirubin: 0.4 mg/dL (ref 0.3–1.2)
Total Protein: 5.6 g/dL — ABNORMAL LOW (ref 6.5–8.1)

## 2017-04-13 LAB — ABO/RH: ABO/RH(D): O POS

## 2017-04-13 LAB — RPR: RPR: NONREACTIVE

## 2017-04-13 SURGERY — Surgical Case
Anesthesia: Epidural | Site: Abdomen

## 2017-04-13 MED ORDER — DIPHENHYDRAMINE HCL 50 MG/ML IJ SOLN: 12.5000 mg | INTRAMUSCULAR | Status: DC | PRN

## 2017-04-13 MED ORDER — WITCH HAZEL-GLYCERIN EX PADS
1.0000 "application " | MEDICATED_PAD | CUTANEOUS | Status: DC | PRN
  Administered 2017-04-16: 1 via TOPICAL

## 2017-04-13 MED ORDER — DEXAMETHASONE SODIUM PHOSPHATE 10 MG/ML IJ SOLN
INTRAMUSCULAR | Status: AC
  Filled 2017-04-13: qty 1

## 2017-04-13 MED ORDER — FENTANYL CITRATE (PF) 100 MCG/2ML IJ SOLN
INTRAMUSCULAR | Status: DC | PRN
  Administered 2017-04-13: 50 ug via EPIDURAL

## 2017-04-13 MED ORDER — SODIUM CHLORIDE 0.9 % IV SOLN: 500.0000 mg | INTRAVENOUS | Status: DC

## 2017-04-13 MED ORDER — DEXAMETHASONE SODIUM PHOSPHATE 10 MG/ML IJ SOLN
INTRAMUSCULAR | Status: DC | PRN
  Administered 2017-04-13: 10 mg via INTRAVENOUS

## 2017-04-13 MED ORDER — PHENYLEPHRINE 40 MCG/ML (10ML) SYRINGE FOR IV PUSH (FOR BLOOD PRESSURE SUPPORT)
80.0000 ug | PREFILLED_SYRINGE | INTRAVENOUS | Status: DC | PRN
  Filled 2017-04-13: qty 5

## 2017-04-13 MED ORDER — ONDANSETRON HCL 4 MG/2ML IJ SOLN: 4.0000 mg | Freq: Three times a day (TID) | INTRAMUSCULAR | Status: DC | PRN

## 2017-04-13 MED ORDER — DEXTROSE 5 % IV SOLN
1.0000 ug/kg/h | INTRAVENOUS | Status: DC | PRN
  Filled 2017-04-13: qty 5

## 2017-04-13 MED ORDER — ENOXAPARIN SODIUM 40 MG/0.4ML ~~LOC~~ SOLN
40.0000 mg | SUBCUTANEOUS | Status: DC
  Administered 2017-04-14 – 2017-04-15 (×2): 40 mg via SUBCUTANEOUS
  Filled 2017-04-13 (×4): qty 0.4

## 2017-04-13 MED ORDER — LIDOCAINE-EPINEPHRINE (PF) 2 %-1:200000 IJ SOLN
INTRAMUSCULAR | Status: DC | PRN
  Administered 2017-04-13: 5 mL via INTRADERMAL
  Administered 2017-04-13: 3 mL via INTRADERMAL
  Administered 2017-04-13 (×2): 5 mL via INTRADERMAL

## 2017-04-13 MED ORDER — NALOXONE HCL 0.4 MG/ML IJ SOLN: 0.4000 mg | INTRAMUSCULAR | Status: DC | PRN

## 2017-04-13 MED ORDER — LIDOCAINE HCL (PF) 1 % IJ SOLN
INTRAMUSCULAR | Status: DC | PRN
  Administered 2017-04-13 (×2): 4 mL via EPIDURAL

## 2017-04-13 MED ORDER — FENTANYL 2.5 MCG/ML BUPIVACAINE 1/10 % EPIDURAL INFUSION (WH - ANES)
INTRAMUSCULAR | Status: AC
  Filled 2017-04-13: qty 100

## 2017-04-13 MED ORDER — SCOPOLAMINE 1 MG/3DAYS TD PT72
MEDICATED_PATCH | TRANSDERMAL | Status: DC | PRN
  Administered 2017-04-13: 1 via TRANSDERMAL

## 2017-04-13 MED ORDER — FENTANYL 2.5 MCG/ML BUPIVACAINE 1/10 % EPIDURAL INFUSION (WH - ANES)
14.0000 mL/h | INTRAMUSCULAR | Status: DC | PRN
  Administered 2017-04-13 (×4): 14 mL/h via EPIDURAL
  Filled 2017-04-13 (×3): qty 100

## 2017-04-13 MED ORDER — ONDANSETRON HCL 4 MG/2ML IJ SOLN
INTRAMUSCULAR | Status: AC
  Filled 2017-04-13: qty 2

## 2017-04-13 MED ORDER — FENTANYL CITRATE (PF) 100 MCG/2ML IJ SOLN
INTRAMUSCULAR | Status: AC
  Filled 2017-04-13: qty 2

## 2017-04-13 MED ORDER — COCONUT OIL OIL
1.0000 "application " | TOPICAL_OIL | Status: DC | PRN
  Administered 2017-04-16: 1 via TOPICAL
  Filled 2017-04-13: qty 120

## 2017-04-13 MED ORDER — SCOPOLAMINE 1 MG/3DAYS TD PT72
MEDICATED_PATCH | TRANSDERMAL | Status: AC
  Filled 2017-04-13: qty 1

## 2017-04-13 MED ORDER — IBUPROFEN 600 MG PO TABS
600.0000 mg | ORAL_TABLET | Freq: Four times a day (QID) | ORAL | Status: DC
  Administered 2017-04-14 – 2017-04-16 (×10): 600 mg via ORAL
  Filled 2017-04-13 (×10): qty 1

## 2017-04-13 MED ORDER — CEFAZOLIN SODIUM-DEXTROSE 2-3 GM-%(50ML) IV SOLR
INTRAVENOUS | Status: DC | PRN
  Administered 2017-04-13: 2 g via INTRAVENOUS

## 2017-04-13 MED ORDER — SIMETHICONE 80 MG PO CHEW
80.0000 mg | CHEWABLE_TABLET | Freq: Three times a day (TID) | ORAL | Status: DC
  Administered 2017-04-14 – 2017-04-16 (×5): 80 mg via ORAL
  Filled 2017-04-13 (×5): qty 1

## 2017-04-13 MED ORDER — LACTATED RINGERS IV SOLN: 500.0000 mL | Freq: Once | INTRAVENOUS | Status: DC

## 2017-04-13 MED ORDER — NALBUPHINE HCL 10 MG/ML IJ SOLN: 5.0000 mg | Freq: Once | INTRAMUSCULAR | Status: DC | PRN

## 2017-04-13 MED ORDER — OXYCODONE HCL 5 MG PO TABS
5.0000 mg | ORAL_TABLET | ORAL | Status: DC | PRN
  Administered 2017-04-14 – 2017-04-15 (×3): 5 mg via ORAL
  Filled 2017-04-13 (×3): qty 1

## 2017-04-13 MED ORDER — OXYTOCIN 10 UNIT/ML IJ SOLN
INTRAMUSCULAR | Status: DC | PRN
  Administered 2017-04-13: 40 [IU] via INTRAVENOUS

## 2017-04-13 MED ORDER — NALBUPHINE HCL 10 MG/ML IJ SOLN: 5.0000 mg | INTRAMUSCULAR | Status: DC | PRN

## 2017-04-13 MED ORDER — SENNOSIDES-DOCUSATE SODIUM 8.6-50 MG PO TABS
2.0000 | ORAL_TABLET | Freq: Every evening | ORAL | Status: DC | PRN
  Filled 2017-04-13: qty 2

## 2017-04-13 MED ORDER — FERROUS SULFATE 325 (65 FE) MG PO TABS
325.0000 mg | ORAL_TABLET | Freq: Two times a day (BID) | ORAL | Status: DC
  Administered 2017-04-14 – 2017-04-16 (×5): 325 mg via ORAL
  Filled 2017-04-13 (×6): qty 1

## 2017-04-13 MED ORDER — TETANUS-DIPHTH-ACELL PERTUSSIS 5-2.5-18.5 LF-MCG/0.5 IM SUSP: 0.5000 mL | Freq: Once | INTRAMUSCULAR | Status: DC

## 2017-04-13 MED ORDER — POLYETHYLENE GLYCOL 3350 17 G PO PACK
17.0000 g | PACK | Freq: Every day | ORAL | Status: DC
  Administered 2017-04-15 – 2017-04-16 (×2): 17 g via ORAL
  Filled 2017-04-13 (×4): qty 1

## 2017-04-13 MED ORDER — OXYTOCIN 40 UNITS IN LACTATED RINGERS INFUSION - SIMPLE MED
1.0000 m[IU]/min | INTRAVENOUS | Status: DC
  Administered 2017-04-13: 3 m[IU]/min via INTRAVENOUS

## 2017-04-13 MED ORDER — LACTATED RINGERS IV SOLN
INTRAVENOUS | Status: DC | PRN
  Administered 2017-04-13 (×3): via INTRAVENOUS

## 2017-04-13 MED ORDER — MEPERIDINE HCL 25 MG/ML IJ SOLN
INTRAMUSCULAR | Status: DC | PRN
  Administered 2017-04-13 (×2): 12.5 mg via INTRAVENOUS

## 2017-04-13 MED ORDER — LACTATED RINGERS IV SOLN
INTRAVENOUS | Status: DC
  Administered 2017-04-13 – 2017-04-14 (×2): via INTRAVENOUS

## 2017-04-13 MED ORDER — MEPERIDINE HCL 25 MG/ML IJ SOLN
INTRAMUSCULAR | Status: AC
  Filled 2017-04-13: qty 1

## 2017-04-13 MED ORDER — EPHEDRINE 5 MG/ML INJ
10.0000 mg | INTRAVENOUS | Status: DC | PRN
  Filled 2017-04-13: qty 2

## 2017-04-13 MED ORDER — DIPHENHYDRAMINE HCL 25 MG PO CAPS: 25.0000 mg | ORAL_CAPSULE | ORAL | Status: DC | PRN

## 2017-04-13 MED ORDER — OXYTOCIN 10 UNIT/ML IJ SOLN
INTRAMUSCULAR | Status: AC
  Filled 2017-04-13: qty 4

## 2017-04-13 MED ORDER — DIBUCAINE 1 % RE OINT
1.0000 "application " | TOPICAL_OINTMENT | RECTAL | Status: DC | PRN
  Administered 2017-04-16: 1 via RECTAL
  Filled 2017-04-13: qty 28

## 2017-04-13 MED ORDER — AZITHROMYCIN 500 MG IV SOLR
500.0000 mg | INTRAVENOUS | Status: AC
  Administered 2017-04-13: 500 mg via INTRAVENOUS
  Filled 2017-04-13: qty 500

## 2017-04-13 MED ORDER — ACETAMINOPHEN 325 MG PO TABS
650.0000 mg | ORAL_TABLET | ORAL | Status: DC | PRN
  Administered 2017-04-14 – 2017-04-16 (×5): 650 mg via ORAL
  Filled 2017-04-13 (×5): qty 2

## 2017-04-13 MED ORDER — SOD CITRATE-CITRIC ACID 500-334 MG/5ML PO SOLN
30.0000 mL | ORAL | Status: AC
  Administered 2017-04-13: 30 mL via ORAL

## 2017-04-13 MED ORDER — PHENYLEPHRINE 40 MCG/ML (10ML) SYRINGE FOR IV PUSH (FOR BLOOD PRESSURE SUPPORT)
80.0000 ug | PREFILLED_SYRINGE | INTRAVENOUS | Status: DC | PRN
  Filled 2017-04-13: qty 10
  Filled 2017-04-13: qty 5

## 2017-04-13 MED ORDER — MORPHINE SULFATE (PF) 0.5 MG/ML IJ SOLN
INTRAMUSCULAR | Status: DC | PRN
  Administered 2017-04-13: 4 mg via EPIDURAL

## 2017-04-13 MED ORDER — KETOROLAC TROMETHAMINE 30 MG/ML IJ SOLN
INTRAMUSCULAR | Status: AC
  Administered 2017-04-13: 30 mg
  Filled 2017-04-13: qty 1

## 2017-04-13 MED ORDER — OXYTOCIN 40 UNITS IN LACTATED RINGERS INFUSION - SIMPLE MED
1.0000 m[IU]/min | INTRAVENOUS | Status: DC
  Administered 2017-04-13: 4 m[IU]/min via INTRAVENOUS
  Administered 2017-04-13: 1 m[IU]/min via INTRAVENOUS
  Filled 2017-04-13: qty 1000

## 2017-04-13 MED ORDER — SODIUM CHLORIDE 0.9 % IR SOLN
Status: DC | PRN
  Administered 2017-04-13: 1

## 2017-04-13 MED ORDER — DIPHENHYDRAMINE HCL 25 MG PO CAPS
25.0000 mg | ORAL_CAPSULE | Freq: Four times a day (QID) | ORAL | Status: DC | PRN
  Administered 2017-04-14: 25 mg via ORAL
  Filled 2017-04-13: qty 1

## 2017-04-13 MED ORDER — PRENATAL MULTIVITAMIN CH
1.0000 | ORAL_TABLET | Freq: Every day | ORAL | Status: DC
  Administered 2017-04-14 – 2017-04-16 (×3): 1 via ORAL
  Filled 2017-04-13 (×3): qty 1

## 2017-04-13 MED ORDER — MORPHINE SULFATE (PF) 0.5 MG/ML IJ SOLN
INTRAMUSCULAR | Status: AC
  Filled 2017-04-13: qty 10

## 2017-04-13 MED ORDER — MENTHOL 3 MG MT LOZG: 1.0000 | LOZENGE | OROMUCOSAL | Status: DC | PRN

## 2017-04-13 MED ORDER — OXYTOCIN 40 UNITS IN LACTATED RINGERS INFUSION - SIMPLE MED
2.5000 [IU]/h | INTRAVENOUS | Status: AC
  Administered 2017-04-14: 2.5 [IU]/h via INTRAVENOUS
  Filled 2017-04-13: qty 1000

## 2017-04-13 MED ORDER — ONDANSETRON HCL 4 MG/2ML IJ SOLN
INTRAMUSCULAR | Status: DC | PRN
  Administered 2017-04-13: 4 mg via INTRAVENOUS

## 2017-04-13 MED ORDER — SODIUM CHLORIDE 0.9% FLUSH: 3.0000 mL | INTRAVENOUS | Status: DC | PRN

## 2017-04-13 MED ORDER — SCOPOLAMINE 1 MG/3DAYS TD PT72
1.0000 | MEDICATED_PATCH | Freq: Once | TRANSDERMAL | Status: DC
  Filled 2017-04-13: qty 1

## 2017-04-13 MED ORDER — OXYCODONE HCL 5 MG PO TABS
10.0000 mg | ORAL_TABLET | ORAL | Status: DC | PRN
  Administered 2017-04-15: 10 mg via ORAL
  Filled 2017-04-13: qty 2

## 2017-04-13 MED ORDER — TERBUTALINE SULFATE 1 MG/ML IJ SOLN: 0.2500 mg | Freq: Once | INTRAMUSCULAR | Status: DC | PRN

## 2017-04-13 SURGICAL SUPPLY — 38 items
BENZOIN TINCTURE PRP APPL 2/3 (GAUZE/BANDAGES/DRESSINGS) ×3 IMPLANT
CANISTER SUCT 3000ML PPV (MISCELLANEOUS) ×3 IMPLANT
CHLORAPREP W/TINT 26ML (MISCELLANEOUS) ×3 IMPLANT
CLOSURE WOUND 1/2 X4 (GAUZE/BANDAGES/DRESSINGS) ×1
DERMABOND ADVANCED (GAUZE/BANDAGES/DRESSINGS) ×2
DERMABOND ADVANCED .7 DNX12 (GAUZE/BANDAGES/DRESSINGS) ×1 IMPLANT
DRSG OPSITE POSTOP 4X10 (GAUZE/BANDAGES/DRESSINGS) ×3 IMPLANT
ELECT REM PT RETURN 9FT ADLT (ELECTROSURGICAL) ×3
ELECTRODE REM PT RTRN 9FT ADLT (ELECTROSURGICAL) ×1 IMPLANT
EXTRACTOR VACUUM KIWI (MISCELLANEOUS) ×3 IMPLANT
GLOVE BIOGEL PI IND STRL 7.0 (GLOVE) ×2 IMPLANT
GLOVE BIOGEL PI IND STRL 7.5 (GLOVE) ×1 IMPLANT
GLOVE BIOGEL PI INDICATOR 7.0 (GLOVE) ×4
GLOVE BIOGEL PI INDICATOR 7.5 (GLOVE) ×2
GLOVE SKINSENSE NS SZ7.0 (GLOVE) ×2
GLOVE SKINSENSE STRL SZ7.0 (GLOVE) ×1 IMPLANT
GOWN STRL REUS W/ TWL LRG LVL3 (GOWN DISPOSABLE) ×2 IMPLANT
GOWN STRL REUS W/ TWL XL LVL3 (GOWN DISPOSABLE) ×1 IMPLANT
GOWN STRL REUS W/TWL LRG LVL3 (GOWN DISPOSABLE) ×4
GOWN STRL REUS W/TWL XL LVL3 (GOWN DISPOSABLE) ×2
NS IRRIG 1000ML POUR BTL (IV SOLUTION) ×3 IMPLANT
PACK C SECTION WH (CUSTOM PROCEDURE TRAY) ×3 IMPLANT
PAD ABD 7.5X8 STRL (GAUZE/BANDAGES/DRESSINGS) ×3 IMPLANT
PAD OB MATERNITY 4.3X12.25 (PERSONAL CARE ITEMS) ×3 IMPLANT
PAD PREP 24X48 CUFFED NSTRL (MISCELLANEOUS) ×3 IMPLANT
PENCIL SMOKE EVAC W/HOLSTER (ELECTROSURGICAL) ×3 IMPLANT
RTRCTR C-SECT PINK 25CM LRG (MISCELLANEOUS) ×3 IMPLANT
SPONGE LAP 18X18 RF (DISPOSABLE) ×3 IMPLANT
STRIP CLOSURE SKIN 1/2X4 (GAUZE/BANDAGES/DRESSINGS) ×2 IMPLANT
SUT CHROMIC 1 CTX 36 (SUTURE) ×6 IMPLANT
SUT MNCRL 0 VIOLET CTX 36 (SUTURE) ×2 IMPLANT
SUT MON AB 4-0 PS1 27 (SUTURE) ×3 IMPLANT
SUT MONOCRYL 0 CTX 36 (SUTURE) ×4
SUT PLAIN 2 0 XLH (SUTURE) ×3 IMPLANT
SUT VIC AB 0 CT1 36 (SUTURE) ×6 IMPLANT
SUT VIC AB 3-0 CT1 27 (SUTURE) ×2
SUT VIC AB 3-0 CT1 TAPERPNT 27 (SUTURE) ×1 IMPLANT
TOWEL OR 17X24 6PK STRL BLUE (TOWEL DISPOSABLE) ×6 IMPLANT

## 2017-04-13 NOTE — Progress Notes (Signed)
L&D Note  04/13/2017 - 6:33 PM  27 y.o. G1 2647w6d. Pregnancy complicated by   Patient Active Problem List   Diagnosis Date Noted  . Anemia in pregnancy 04/13/2017  . PROM (premature rupture of membranes) 04/12/2017  . Supervision of normal first pregnancy 09/20/2016  . BV (bacterial vaginosis) 09/11/2016  . Abdominal pain 04/22/2014    Ms. Zaraya L Cieslak is admitted for SROM, early labor, gHTN   Subjective:  Pt feeling tired. Comfortable with epidural  Objective:   Vitals:   04/13/17 1646 04/13/17 1701 04/13/17 1800 04/13/17 1831  BP: 125/69 (!) 144/77 (!) 142/86 (!) 141/95  Pulse: (!) 104 (!) 104 (!) 102 (!) 109  Resp:  20 20   Temp:   99.2 F (37.3 C)   TempSrc:   Axillary   SpO2:      Weight:      Height:        Current Vital Signs 24h Vital Sign Ranges  T 99.2 F (37.3 C) Temp  Avg: 98.7 F (37.1 C)  Min: 97.9 F (36.6 C)  Max: 99.5 F (37.5 C)  BP (!) 141/95 BP  Min: 94/48  Max: 149/109  HR (!) 109 Pulse  Avg: 101.1  Min: 84  Max: 113  RR 20 Resp  Avg: 18.6  Min: 15  Max: 22  SaO2 100 %   SpO2  Avg: 99.8 %  Min: 99 %  Max: 100 %       24 Hour I/O Current Shift I/O  Time Ins Outs No intake/output data recorded. No intake/output data recorded.   FHR: 150 baseline, +accels, no decels, mod variability Toco: q2-648m Gen: NAD SVE: complete/+2, ?LOA.   Labs:  Recent Labs  Lab 04/12/17 2120  WBC 18.2*  HGB 9.6*  HCT 30.2*  PLT 384   Recent Labs  Lab 04/12/17 2120  NA 133*  K 3.9  CL 108  CO2 16*  BUN 9  CREATININE 0.61  CALCIUM 9.0  PROT 5.6*  BILITOT 0.4  ALKPHOS 184*  ALT 16  AST 23  GLUCOSE 104*    Medications Current Facility-Administered Medications  Medication Dose Route Frequency Provider Last Rate Last Dose  . acetaminophen (TYLENOL) tablet 650 mg  650 mg Oral Q4H PRN Cheral MarkerBooker, Kimberly R, CNM      . diphenhydrAMINE (BENADRYL) injection 12.5 mg  12.5 mg Intravenous Q15 min PRN Mal AmabileFoster, Michael, MD      . ePHEDrine injection 10 mg  10  mg Intravenous PRN Mal AmabileFoster, Michael, MD      . ePHEDrine injection 10 mg  10 mg Intravenous PRN Mal AmabileFoster, Michael, MD      . fentaNYL (SUBLIMAZE) injection 50-100 mcg  50-100 mcg Intravenous Q1H PRN Cheral MarkerBooker, Kimberly R, CNM   100 mcg at 04/12/17 2259  . fentaNYL 2.5 mcg/ml w/bupivacaine 0.1% in NS 100ml epidural infusion (WH-ANES)  14 mL/hr Epidural Continuous PRN Mal AmabileFoster, Michael, MD 14 mL/hr at 04/13/17 1730 14 mL/hr at 04/13/17 1730  . hydrOXYzine (ATARAX/VISTARIL) tablet 50 mg  50 mg Oral Q6H PRN Cheral MarkerBooker, Kimberly R, CNM      . lactated ringers infusion 500 mL  500 mL Intravenous Once Mal AmabileFoster, Michael, MD      . lactated ringers infusion 500 mL  500 mL Intravenous Once Mal AmabileFoster, Michael, MD      . lactated ringers infusion 500-1,000 mL  500-1,000 mL Intravenous PRN Cheral MarkerBooker, Kimberly R, CNM      . lactated ringers infusion   Intravenous Continuous Shawna ClampBooker, Kimberly  R, CNM 125 mL/hr at 04/13/17 1453    . lidocaine (PF) (XYLOCAINE) 1 % injection 30 mL  30 mL Subcutaneous PRN Cheral Marker, CNM      . ondansetron (ZOFRAN) injection 4 mg  4 mg Intravenous Q6H PRN Cheral Marker, CNM      . oxyCODONE-acetaminophen (PERCOCET/ROXICET) 5-325 MG per tablet 1 tablet  1 tablet Oral Q4H PRN Cheral Marker, CNM      . oxyCODONE-acetaminophen (PERCOCET/ROXICET) 5-325 MG per tablet 2 tablet  2 tablet Oral Q4H PRN Cheral Marker, CNM      . oxytocin (PITOCIN) IV BOLUS FROM BAG  500 mL Intravenous Once Cheral Marker, CNM      . oxytocin (PITOCIN) IV infusion 40 units in LR 1000 mL - Premix  2.5 Units/hr Intravenous Continuous Cheral Marker, CNM      . oxytocin (PITOCIN) IV infusion 40 units in LR 1000 mL - Premix  1-40 milli-units/min Intravenous Titrated Rolm Bookbinder, CNM 7.5 mL/hr at 04/13/17 1644 5 milli-units/min at 04/13/17 1644  . PHENYLephrine 40 mcg/ml in normal saline Adult IV Push Syringe  80 mcg Intravenous PRN Mal Amabile, MD      . PHENYLephrine 40 mcg/ml in normal saline  Adult IV Push Syringe  80 mcg Intravenous PRN Mal Amabile, MD      . sodium citrate-citric acid (ORACIT) solution 30 mL  30 mL Oral Q2H PRN Cheral Marker, CNM      . sodium phosphate (FLEET) 7-19 GM/118ML enema 1 enema  1 enema Rectal PRN Cheral Marker, CNM      . terbutaline (BRETHINE) injection 0.25 mg  0.25 mg Subcutaneous Once PRN Cheral Marker, CNM      . terbutaline (BRETHINE) injection 0.25 mg  0.25 mg Subcutaneous Once PRN Cresenzo-Dishmon, Scarlette Calico, CNM       Facility-Administered Medications Ordered in Other Encounters  Medication Dose Route Frequency Provider Last Rate Last Dose  . lidocaine (PF) (XYLOCAINE) 1 % injection    Anesthesia Intra-op Mal Amabile, MD   4 mL at 04/13/17 0103    Assessment & Plan:  Pt stable *IUP: category I *Labor: continue pitocin. Poor maternal effort but when she does have a good push, she does move the baby.  Has been complete since approx 1400 and has been pushing since 1700. D/w her to continue to push and will reassess in approx 84m. May off operative vag delivery. Pelvis feels adequate and efw 3600gm.  *GBS: neg *Analgesia: epidural working well  Cornelia Copa. MD Attending Center for [facility])

## 2017-04-13 NOTE — Progress Notes (Signed)
104/64 117/69 Foley fell out.  cx 5/90/0.  Pt comfortable w/epidural.  FHR 145, Cat 1.  Ctx q 2-3 minutes.  Making good progress.  Continue present management.

## 2017-04-13 NOTE — Progress Notes (Signed)
L&D Note  Patient crying and wants c-section and wants to stop pushing  SVE: unchanged +1 to +2, ?LOA.  Pt with no maternal effort with pushing  Category I, q2-1858m UCs  D/w pt that if she can push the fetus down further that can try and help her from below. Pt doesn't want to push anymore. Risks of c/s d/w her and that I feels she can have a VD but she has to push but she wants a c-section. Will stop pit and can proceed to OR when they are ready. Will write for azithro 500 given labor and for c-section in addition to ancef.   Cornelia Copaharlie Inola Lisle, Jr MD Attending Center [facility]) 04/13/2017 Time: [phone]

## 2017-04-13 NOTE — Progress Notes (Signed)
Vitals:   04/13/17 0531 04/13/17 0601  BP: (!) 129/59 123/79  Pulse: (!) 101 (!) 104  Resp:    Temp:    SpO2:     No change in cx despite frequent ctx, so pitocin started at 0515 FHR 150, Cat 1.  Ctx q 2 m inutes.   Pr/cr ratio (run d/t 2 mildly elevated bps @ admission) .32.  BPs have been normal since, so reluctant to label preeclampsia, doesn't meet BP criteria.  Will continue to monitor BP.

## 2017-04-13 NOTE — Transfer of Care (Signed)
Immediate Anesthesia Transfer of Care Note  Patient: [name] Brown  Procedure(s) Performed: CESAREAN SECTION (N/A Abdomen)  Patient Location: PACU  Anesthesia Type:Epidural  Level of Consciousness: awake, alert  and oriented  Airway & Oxygen Therapy: Patient Spontanous Breathing  Post-op Assessment: Report given to RN and Post -op Vital signs reviewed and stable  Post vital signs: Reviewed and stable  Last Vitals:  Vitals Value Taken Time  BP    Temp    Pulse    Resp    SpO2      Last Pain:  Vitals:   04/13/17 1905  TempSrc: Axillary  PainSc:          Complications: No apparent anesthesia complications

## 2017-04-13 NOTE — Progress Notes (Signed)
Labor Progress Note [name] Brown is a 27 y.o. G1P0000 at 7736w6d presented for SROM  S:  Patient comfortable with epidural  O:  BP 122/68   Pulse (!) 103   Temp 99.5 F (37.5 C) (Axillary)   Resp 20   Ht 5\' 4"  (1.626 m)   Wt 193 lb (87.5 kg)   LMP 07/16/2016   SpO2 100%   BMI 33.13 kg/m   Fetal Tracing:  Baseline: 145 Variability: moderate Accels: 15x15 Decels: none  Toco: q2  CVE: Dilation: 6 Effacement (%): 80 Cervical Position: Middle Station: -1 Presentation: Vertex Exam by:: [name] Brown, cnm   A&P: 26 y.o. G1P0000 6636w6d SROM #Labor: Minimal cervical change. Discussed with patient risks and benefits of IUPC placement. Patient agreeable to plan of care. IUPC placed without difficulty and patient tolerated procedure well.  Will monitor patient's temperature closely. Slight elevation over last 2 hours.   #Pain: epidural #FWB: Cat 1 #GBS negative  Rolm Bookbinderaroline M Shereda Graw, CNM 9:46 AM

## 2017-04-13 NOTE — Anesthesia Procedure Notes (Signed)
Epidural Patient location during procedure: OB Start time: 04/13/2017 1:02 AM  Staffing Anesthesiologist: Mal AmabileFoster, Leana Springston, MD Performed: anesthesiologist   Preanesthetic Checklist Completed: patient identified, site marked, surgical consent, pre-op evaluation, timeout performed, IV checked, risks and benefits discussed and monitors and equipment checked  Epidural Patient position: sitting Prep: site prepped and draped and DuraPrep Patient monitoring: continuous pulse ox and blood pressure Approach: midline Location: L3-L4 Injection technique: LOR air  Needle:  Needle type: Tuohy  Needle gauge: 17 G Needle length: 9 cm and 9 Needle insertion depth: 5 cm cm Catheter type: closed end flexible Catheter size: 19 Gauge Catheter at skin depth: 10 cm Test dose: negative and Other  Assessment Events: blood not aspirated, injection not painful, no injection resistance, negative IV test and no paresthesia  Additional Notes Patient identified. Risks and benefits discussed including failed block, incomplete  Pain control, post dural puncture headache, nerve damage, paralysis, blood pressure Changes, nausea, vomiting, reactions to medications-both toxic and allergic and post Partum back pain. All questions were answered. Patient expressed understanding and wished to proceed. Sterile technique was used throughout procedure. Epidural site was Dressed with sterile barrier dressing. No paresthesias, signs of intravascular injection Or signs of intrathecal spread were encountered.  Patient was more comfortable after the epidural was dosed. Please see RN's note for documentation of vital signs and FHR which are stable.

## 2017-04-13 NOTE — Progress Notes (Signed)
Labor Progress Note Mykenzie L Marchia BondCoe is a 27 y.o. G1P0000 at 5180w6d presented for SROM  S:  Patient reporting pressure in her rectum.  O:  BP (!) 94/48   Pulse (!) 109   Temp 99.3 F (37.4 C) (Axillary)   Resp 20   Ht 5\' 4"  (1.626 m)   Wt 193 lb (87.5 kg)   LMP 07/16/2016   SpO2 100%   BMI 33.13 kg/m   Fetal Tracing:  Baseline: 155 Variability: moderate Accels: 15x15 Decels: none  Toco: 1-4   CVE: Dilation: 8 Effacement (%): 90 Cervical Position: Middle Station: Plus 1 Presentation: Vertex Exam by:: neil   A&P: 26 y.o. G1P0000 9680w6d SROM #Labor: Progressing well. Continue pitocin. No change in maternal temp. Will continue to watch.  #Pain: epidural #FWB: Cat 1 #GBS negative  Rolm Bookbinderaroline M Shanti Agresti, CNM 1:05 PM

## 2017-04-13 NOTE — Anesthesia Pain Management Evaluation Note (Signed)
  CRNA Pain Management Visit Note  Patient: [name] Brown, 27 y.o., female  "Hello [facility]. We have an anesthesia team available at all times to provide care throughout the hospital, including epidural management and anesthesia for C-section. I don't know your plan for the delivery whether it a natural birth, water birth, IV sedation, nitrous supplementation, doula or epidural, but we want to meet your pain goals."   1.Was your pain managed to your expectations on prior hospitalizations?   No prior hospitalizations  2.What is your expectation for pain management during this hospitalization?     Epidural  3.How can we help you reach that goal? Maintain epidural.  Record the patient's initial score and the patient's pain goal.   Pain: 3  Pain Goal: 3 [facility] wants you to be able to say your pain was always managed very well.  [name] Brown 04/13/2017

## 2017-04-13 NOTE — Progress Notes (Signed)
   Test: Pr/Cr ratio Critical Value: 0.31  Name of Provider Notified: Drenda FreezeFran CNM  Orders Received? Or Actions Taken?: n/a

## 2017-04-13 NOTE — Op Note (Signed)
Operative Note   SURGERY DATE: 04/13/2017  PRE-OP DIAGNOSIS:  *Pregnancy at 39/6 *Maternal request for cesarean section  POST-OP DIAGNOSIS: Same. Delivered   PROCEDURE: primary low transverse cesarean section via pfannenstiel skin incision with double layer uterine closure  SURGEON: Surgeon(s) and Role:    * Kieler Bing, MD - Primary  ASSISTANT:    * Degele, Kandra Nicolas, MD - Assisting  ANESTHESIA: epidural  ESTIMATED BLOOD LOSS:  DRAINS: UOP via indwelling foley  TOTAL IV FLUIDS: crystalloid  VTE PROPHYLAXIS: SCDs to bilateral lower extremities  ANTIBIOTICS: Two grams of Cefazolin were given., within 1 hour of skin incision and azithromycin 500mg  IV x 1 was started in the OR  SPECIMENS: none  COMPLICATIONS: none  INDICATIONS: fetus came down to +1 to +2 and pelvis felt adequate. Patient with poor maternal effort and not pushing well/not wanting to push. When she did push the fetus came down, and I told her that if she could consistently push well and bring fetus to +2 to +3 that can offer operative vaginal delivery. Patient tearful and demanding c-section, even after risks explained to her.   FINDINGS: No intra-abdominal adhesions were noted. Grossly normal uterus, tubes and ovaries. Clear amniotic fluid, cephalic/ female infant, weight 4085gm, APGARs 10/10, intact placenta.  PROCEDURE IN DETAIL: The patient was taken to the operating room where anesthesia was administered and normal fetal heart tones were confirmed. She was then prepped and draped in the normal fashion in the dorsal supine position with a leftward tilt.  After a time out was performed, a pfannensteil  skin incision was made with the scalpel and carried through to the underlying layer of fascia. The fascia was then incised at the midline and this incision was extended laterally with the mayo scissors. Attention was turned to the superior aspect of the fascial incision which was grasped with  the kocher clamps x 2, tented up and the rectus muscles were dissected off with the bovie. In a similar fashion the inferior aspect of the fascial incision was grasped with the kocher clamps, tented up and the rectus muscles dissected off with the mayo scissors. The rectus muscles were then separated in the midline and the peritoneum was entered bluntly. The bladder blade was inserted and the vesicouterine peritoneum was identified, tented up and entered with the metzenbaum scissors. This incision was extended laterally and the bladder flap was created digitally. The bladder blade was reinserted.  A low transverse hysterotomy was made with the scalpel until the endometrial cavity was breached and the amniotic sac ruptured, yielding clear amniotic fluid. This incision was extended bluntly and the infant's head, shoulders and body were delivered atraumatically.The cord was clamped x 2 and cut, and the infant was handed to the awaiting pediatricians, after delayed cord clamping was done.  The placenta was then gradually expressed from the uterus and then the uterus was exteriorized and cleared of all clots and debris. The hysterotomy was repaired with a running suture of 1-0 monocryl. She had a right hysterotomy extension. A second imbricating layer of #1 chromic suture was then placed. Several figure-of-eight sutures of  #1 chromic  were added to achieve excellent hemostasis.   The uterus and adnexa were then returned to the abdomen, and the hysterotomy and all operative sites were reinspected and excellent hemostasis was noted after irrigation and suction of the abdomen with warm saline.  The peritoneum was closed with a running stitch of 3-0 Vicryl. The fascia was reapproximated with  0 Vicryl in a simple running fashion bilaterally. The subcutaneous layer was then reapproximated with interrupted sutures of 2-0 plain gut, and the skin was then closed with 4-0 monocryl, in a subcuticular fashion.  The patient   tolerated the procedure well. Sponge, lap, needle, and instrument counts were correct x 2. The patient was transferred to the recovery room awake, alert and breathing independently in stable condition.  Cornelia Copaharlie Sartaj Hoskin, Jr. MD Attending Center for [facility](Faculty Practice)

## 2017-04-13 NOTE — H&P (Addendum)
[name] Brown is a 27 y.o. female G1P0000 with IUP at [redacted]w[redacted]d presenting for ROM/early labor. Pt states she has been having regular, every 2-3 minutes contractions, associated with none vaginal bleeding for 3 hours..  Membranes are ruptured, clear fluid at 2000, with active fetal movement.   [facility]  Prenatal History/Complications: none Past Medical History: Past Medical History:  Diagnosis Date  . Chronic abdominal pain   . Colitis   . Counseling for birth control, oral contraceptives 06/26/2014  . Medical history non-contributory   . Missed period 06/26/2014    Past Surgical History: Past Surgical History:  Procedure Laterality Date  . NO PAST SURGERIES      Obstetrical History: OB History    Gravida  1   Para  0   Term  0   Preterm  0   AB  0   Living  0     SAB  0   TAB  0   Ectopic  0   Multiple  0   Live Births               Social History: Social History   Socioeconomic History  . Marital status: Single    Spouse name: Not on file  . Number of children: Not on file  . Years of education: Not on file  . Highest education level: Not on file  Occupational History  . Not on file  Social Needs  . Financial resource strain: Not on file  . Food insecurity:    Worry: Not on file    Inability: Not on file  . Transportation needs:    Medical: Not on file    Non-medical: Not on file  Tobacco Use  . Smoking status: Never Smoker  . Smokeless tobacco: Never Used  Substance and Sexual Activity  . Alcohol use: No    Comment: occ  . Drug use: No  . Sexual activity: Not Currently    Birth control/protection: None  Lifestyle  . Physical activity:    Days per week: Not on file    Minutes per session: Not on file  . Stress: Not on file  Relationships  . Social connections:    Talks on phone: Not on file    Gets together: Not on file    Attends religious service: Not on file    Active member of club or organization: Not on file   Attends meetings of clubs or organizations: Not on file    Relationship status: Not on file  Other Topics Concern  . Not on file  Social History Narrative  . Not on file    Family History: Family History  Problem Relation Age of Onset  . Cancer Mother        cervical  . Alcohol abuse Mother   . Other Father        fatty liver  . Cancer Maternal Grandmother        skin  . Dementia Maternal Grandmother   . Dementia Paternal Grandmother   . Diabetes Paternal Grandfather   . Dementia Paternal Grandfather     Allergies: Allergies  Allergen Reactions  . Mineral Oil Rash    Baby oil    Medications Prior to Admission  Medication Sig Dispense Refill Last Dose  . flintstones complete (FLINTSTONES) 60 MG chewable tablet Chew 2 tablets by mouth daily.   Past Week at Unknown time  . cetirizine (ZYRTEC) 10 MG tablet Take 10 mg by mouth  daily.   More than a month at Unknown time  . ferrous sulfate 325 (65 FE) MG tablet Take 1 tablet (325 mg total) by mouth 2 (two) times daily with a meal. (Patient not taking: Reported on 04/12/2017) 60 tablet 3 Not Taking      Vitals:   04/12/17 2143 04/12/17 2349  BP: 133/89 (!) 140/91  Pulse: (!) 101 84  Resp:    Temp: 97.9 F (36.6 C)   .   Review of Systems   Constitutional: Negative for fever and chills Eyes: Negative for visual disturbances Respiratory: Negative for shortness of breath, dyspnea Cardiovascular: Negative for chest pain or palpitations  Gastrointestinal: Negative for vomiting, diarrhea and constipation.  POSITIVE for abdominal pain (contractions) Genitourinary: Negative for dysuria and urgency Musculoskeletal: Negative for back pain, joint pain, myalgias  Neurological: Negative for dizziness and headaches      Blood pressure (!) 140/91, pulse 84, temperature 97.9 F (36.6 C), temperature source Oral, resp. rate 20, height 5\' 4"  (1.626 m), weight 87.5 kg (193 lb), last menstrual period 07/16/2016. General  appearance: alert, cooperative and no distress Lungs: clear to auscultation bilaterally Heart: regular rate and rhythm Abdomen: soft, non-tender; bowel sounds normal Extremities: Homans sign is negative, no sign of DVT DTR's 2+ Presentation: cephalic Fetal monitoring  Baseline: 145 bpm, Variability: Good {> 6 bpm), Accelerations: Reactive and Decelerations: Absent Uterine activity  2-3 Dilation: 1.5 Effacement (%): 90 Station: -1 Exam by:: Rockwell Germany, RN     Prenatal labs: ABO, Rh: --/--/O POS (03/21 2120) Antibody: NEG (03/21 2120) Rubella: <0.90 (08/29 1234) RPR: Non Reactive (12/26 0842)  HBsAg: Negative (08/29 1234)  HIV: Non Reactive (12/26 0842)    Prenatal Transfer Tool  Maternal Diabetes: No Genetic Screening: Normal Maternal Ultrasounds/Referrals: Normal Fetal Ultrasounds or other Referrals:  None Maternal Substance Abuse:  No Significant Maternal Medications:  None Significant Maternal Lab Results: Lab values include: Group B Strep negative     Results for orders placed or performed during the hospital encounter of 04/12/17 (from the past 24 hour(s))  POCT fern test   Collection Time: 04/12/17  9:00 PM  Result Value Ref Range   POCT Fern Test Positive = ruptured amniotic membanes   CBC   Collection Time: 04/12/17  9:20 PM  Result Value Ref Range   WBC 18.2 (H) 4.0 - 10.5 K/uL   RBC 3.73 (L) 3.87 - 5.11 MIL/uL   Hemoglobin 9.6 (L) 12.0 - 15.0 g/dL   HCT 60.4 (L) 54.0 - 98.1 %   MCV 81.0 78.0 - 100.0 fL   MCH 25.7 (L) 26.0 - 34.0 pg   MCHC 31.8 30.0 - 36.0 g/dL   RDW 19.1 (H) 47.8 - 29.5 %   Platelets 384 150 - 400 K/uL  Type and screen   Collection Time: 04/12/17  9:20 PM  Result Value Ref Range   ABO/RH(D) O POS    Antibody Screen NEG    Sample Expiration      04/15/2017 Performed at [facility], [address] [place], [place] [address]   Results for orders placed or performed in visit on 04/12/17 (from the past 24 hour(s))  POCT  Urinalysis Dipstick   Collection Time: 04/12/17 10:22 AM  Result Value Ref Range   Color, UA     Clarity, UA     Glucose, UA neg    Bilirubin, UA     Ketones, UA neg    Spec Grav, UA  1.010 - 1.025   Blood,  UA neg    pH, UA  5.0 - 8.0   Protein, UA trace    Urobilinogen, UA  0.2 or 1.0 E.U./dL   Nitrite, UA neg    Leukocytes, UA Negative Negative   Appearance     Odor      [facility]  Initiated Care at  10wk  FOB No involved  Dating By 1st trimester U/S 8wk  Pap NEG 2018  GC/CT Initial:        -/-        36+wks:   -/-  Genetic Screen NT/IT: neg  CF screen declined  Anatomic US Normal female  Flu vaccine 11/01/16  Tdap Recommended ~ 28wks  Glucose Screen  2 hr normal: 80/140/128  GBS neg  Feed Preference breast  Contraception Undecided, discussed  Circumcision n/a  Childbirth Classes declined  Pediatrician [facility]    Assessment: [name] Brown is a 27 y.o. G1P0000 with an IUP at 8056w6d presenting for ROM at 2000, early labor  BP borderline:  CMP/pr/cr ratio added  Plan: #Labor:  Foley placed #Pain:  Per request #FWB Cat 1  Jacklyn ShellFrances Cresenzo-Dishmon 04/13/2017, 12:06 AM

## 2017-04-13 NOTE — Progress Notes (Signed)
Pt anxious, crying, and refusing to push. Dr Vergie LivingPickens in room.  Pt demanding c/s

## 2017-04-13 NOTE — Anesthesia Preprocedure Evaluation (Signed)
Anesthesia Evaluation  Patient identified by MRN, date of birth, ID band Patient awake    Reviewed: Allergy & Precautions, Patient's Chart, lab work & pertinent test results  Airway Mallampati: II  TM Distance: >3 FB Neck ROM: Full    Dental no notable dental hx. (+) Teeth Intact   Pulmonary neg pulmonary ROS,    breath sounds clear to auscultation       Cardiovascular negative cardio ROS Normal cardiovascular exam Rhythm:Regular Rate:Normal     Neuro/Psych negative neurological ROS  negative psych ROS   GI/Hepatic Neg liver ROS, GERD  Medicated and Controlled,Chronic abdominal pain Hx/o colitis   Endo/Other  Obesity  Renal/GU negative Renal ROS  negative genitourinary   Musculoskeletal negative musculoskeletal ROS (+)   Abdominal   Peds  Hematology  (+) anemia ,   Anesthesia Other Findings   Reproductive/Obstetrics (+) Pregnancy                             Anesthesia Physical Anesthesia Plan  ASA: II  Anesthesia Plan: Epidural   Post-op Pain Management:    Induction:   PONV Risk Score and Plan:   Airway Management Planned: Natural Airway  Additional Equipment:   Intra-op Plan:   Post-operative Plan:   Informed Consent: I have reviewed the patients History and Physical, chart, labs and discussed the procedure including the risks, benefits and alternatives for the proposed anesthesia with the patient or authorized representative who has indicated his/her understanding and acceptance.     Plan Discussed with: Anesthesiologist  Anesthesia Plan Comments:         Anesthesia Quick Evaluation

## 2017-04-14 ENCOUNTER — Encounter (HOSPITAL_COMMUNITY): Payer: Self-pay | Admitting: Obstetrics and Gynecology

## 2017-04-14 DIAGNOSIS — Z98891 History of uterine scar from previous surgery: Secondary | ICD-10-CM

## 2017-04-14 LAB — CBC
HCT: 22.3 % — ABNORMAL LOW (ref 36.0–46.0)
Hemoglobin: 7 g/dL — ABNORMAL LOW (ref 12.0–15.0)
MCH: 25.5 pg — ABNORMAL LOW (ref 26.0–34.0)
MCHC: 31.4 g/dL (ref 30.0–36.0)
MCV: 81.1 fL (ref 78.0–100.0)
PLATELETS: 281 10*3/uL (ref 150–400)
RBC: 2.75 MIL/uL — ABNORMAL LOW (ref 3.87–5.11)
RDW: 16.1 % — AB (ref 11.5–15.5)
WBC: 33 10*3/uL — AB (ref 4.0–10.5)

## 2017-04-14 NOTE — Lactation Note (Signed)
This note was copied from a baby's chart. Lactation Consultation Note  Patient Name: [name] Brown Today's Date: 04/14/2017 Reason for consult: Initial assessment;1st time breastfeeding;Primapara;Term  Visited with P1 Mom of term baby at 4522 hrs old.  Baby has fed a couple times, mostly attempts.  RN started a nipple shield 20 mm.  Mom has had a room full of family visitors.  Mom states she is uncomfortable and exhausted.  Baby is dressed and swaddled in Mom's arms.  Reminded her of importance of baby being undressed and STS on her chest.  Offered to assist with positioning and latching.  Mom has large breasts, and small everted nipples.  Areola compressible.  Baby doesn't open her mouth wide enough to attain a deep latch.  Repeated attempts made before baby was able to.  20 mm nipple shield tried, but baby pushing this out of her mouth.   Mom assisted with sandwiching her breast.  Baby finally opened widely and latched.  Mom stated she was feeling a tug.  Nice jaw extensions, and swallows identified for her.  Taught Mom to use alternate breast compression to increase milk transfer.   On hand expression, Mom has an easy milk flow.  Talked about spoon feeding if baby has difficulty latching, and to call for guidance.  Encouraged Mom to keep her STS on her chest.  To offer breast often on cue, at least 8 times per 24 hrs is goal.  Lactation brochure given to Mom.  Informed her of IP and OP lactation services available to her.    Maternal Data Formula Feeding for Exclusion: No Has patient been taught Hand Expression?: Yes Does the patient have breastfeeding experience prior to this delivery?: No  Feeding Feeding Type: Breast Fed Length of feed: 20 min(on and off)  LATCH Score Latch: Repeated attempts needed to sustain latch, nipple held in mouth throughout feeding, stimulation needed to elicit sucking reflex.  Audible Swallowing: A few with stimulation  Type of Nipple: Everted at rest  and after stimulation  Comfort (Breast/Nipple): Soft / non-tender  Hold (Positioning): Assistance needed to correctly position infant at breast and maintain latch.  LATCH Score: 7  Interventions Interventions: Breast feeding basics reviewed;Assisted with latch;Skin to skin;Breast massage;Hand express;Pre-pump if needed;Breast compression;Adjust position;Support pillows;Position options;Expressed milk;Hand pump  Lactation Tools Discussed/Used Tools: Pump Nipple shield size: 20 Shell Type: Inverted Breast pump type: Manual Initiated by:: RN Date initiated:: 04/14/17   Consult Status Consult Status: Follow-up Date: 04/15/17 Follow-up type: In-patient    Judee ClaraSmith, Decorey Wahlert E 04/14/2017, 7:21 PM

## 2017-04-14 NOTE — Progress Notes (Signed)
Post Partum Day 1 pLTCS Subjective: no complaints, foley still in, hasn't been out of bed yet. Pain well controlled.   Objective: Blood pressure 108/72, pulse 80, temperature 98.4 F (36.9 C), temperature source Oral, resp. rate 17, height 5\' 4"  (1.626 m), weight 193 lb (87.5 kg), last menstrual period 07/16/2016, SpO2 98 %, unknown if currently breastfeeding.  Physical Exam:  General: alert and no distress Lochia: appropriate Uterine Fundus: firm Incision: healing well, no significant drainage DVT Evaluation: No evidence of DVT seen on physical exam.  Recent Labs    04/12/17 2120  HGB 9.6*  HCT 30.2*    Assessment/Plan: Plan for discharge tomorrow, Breastfeeding and Contraception Nexplanon    LOS: 2 days   [name] ShellerHeather Saanya Brown 04/14/2017, 4:19 AM

## 2017-04-14 NOTE — Anesthesia Postprocedure Evaluation (Signed)
Anesthesia Post Note  Patient: [name] Brown  Procedure(s) Performed: CESAREAN SECTION (N/A Abdomen)     Patient location during evaluation: Mother Baby Anesthesia Type: Epidural Level of consciousness: awake and alert, oriented and patient cooperative Pain management: pain level controlled Vital Signs Assessment: post-procedure vital signs reviewed and stable Respiratory status: spontaneous breathing Cardiovascular status: stable Postop Assessment: no headache, epidural receding, patient able to bend at knees and no signs of nausea or vomiting Anesthetic complications: no Comments: Pain score 2.    Last Vitals:  Vitals:   04/14/17 0515 04/14/17 0620  BP:    Pulse:    Resp:  18  Temp:  36.8 C  SpO2: 98% 97%    Last Pain:  Vitals:   04/14/17 0620  TempSrc: Oral  PainSc: 0-No pain   Pain Goal:                 [facility] Health CenterWRINKLE,Zeynep Fantroy

## 2017-04-15 ENCOUNTER — Encounter (HOSPITAL_COMMUNITY): Payer: Self-pay | Admitting: *Deleted

## 2017-04-15 MED ORDER — IBUPROFEN 600 MG PO TABS: 600.0000 mg | ORAL_TABLET | Freq: Four times a day (QID) | ORAL | 0 refills | Status: DC

## 2017-04-15 MED ORDER — OXYCODONE-ACETAMINOPHEN 5-325 MG PO TABS: 1.0000 | ORAL_TABLET | Freq: Four times a day (QID) | ORAL | 0 refills | Status: DC | PRN

## 2017-04-15 NOTE — Discharge Instructions (Signed)

## 2017-04-15 NOTE — Discharge Summary (Addendum)
OB Discharge Summary    Patient Name: [name] Brown DOB: 03/01/1990 MRN: [id number]  Date of admission: 04/12/2017 Delivering MD: Cashton BingPICKENS, CHARLIE   Date of discharge: 04/15/2017  Admitting diagnosis: 39.5WKS WATER BROKE Intrauterine pregnancy: 6736w6d     Secondary diagnosis:  Principal Problem:   Status post primary low transverse cesarean section Active Problems:   Supervision of normal first pregnancy   PROM (premature rupture of membranes)   Anemia in pregnancy  Additional problems:      Discharge diagnosis: Term Pregnancy Delivered                                                                                                Post partum procedures: N/A  Augmentation: Pitocin and Foley Balloon  Complications: None  Hospital course:  Onset of Labor With Unplanned C/S  27 y.o. yo G1P1001 at 3836w6d was admitted in Latent Labor on 04/12/2017. Patient had a mostly uncomplicated labor course but had poor effort with pushing and requested C/S. Membrane Rupture Time/Date: 8:30 PM ,04/12/2017   The patient went for cesarean section due to Elective Primary, and delivered a Viable infant,04/13/2017  Details of operation can be found in separate operative note. Patient had an uncomplicated postpartum course.  She is ambulating,tolerating a regular diet, passing flatus, and urinating well.  Patient is discharged home in stable condition 04/15/17.  Physical exam  Vitals:   04/14/17 1043 04/14/17 1435 04/14/17 1725 04/15/17 0519  BP: (!) 94/47 (!) 93/58 103/62 107/66  Pulse: 83 79 82 87  Resp: 20 18 20 18   Temp: 98 F (36.7 C) 98 F (36.7 C) 97.8 F (36.6 C) 98 F (36.7 C)  TempSrc:    Oral  SpO2: 98% 98%    Weight:      Height:       General: alert, cooperative and no distress Lochia: appropriate Uterine Fundus: firm Incision: Dressing is clean, dry, and intact DVT Evaluation: No evidence of DVT seen on physical exam. Labs: Lab Results  Component Value Date   WBC 33.0 (H)  04/14/2017   HGB 7.0 (L) 04/14/2017   HCT 22.3 (L) 04/14/2017   MCV 81.1 04/14/2017   PLT 281 04/14/2017   CMP Latest Ref Rng & Units 04/12/2017  Glucose 65 - 99 mg/dL 782(N104(H)  BUN 6 - 20 mg/dL 9  Creatinine 5.620.44 - 1.301.00 mg/dL 8.650.61  Sodium 784135 - 696145 mmol/L 133(L)  Potassium 3.5 - 5.1 mmol/L 3.9  Chloride 101 - 111 mmol/L 108  CO2 22 - 32 mmol/L 16(L)  Calcium 8.9 - 10.3 mg/dL 9.0  Total Protein 6.5 - 8.1 g/dL 2.9(B5.6(L)  Total Bilirubin 0.3 - 1.2 mg/dL 0.4  Alkaline Phos 38 - 126 U/L 184(H)  AST 15 - 41 U/L 23  ALT 14 - 54 U/L 16    Discharge instruction: per After Visit Summary and "Baby and Me Booklet".  After visit meds:  Allergies as of 04/15/2017      Reactions   Mineral Oil Rash   Baby oil      Medication List    STOP taking these medications  ferrous sulfate 325 (65 FE) MG tablet     TAKE these medications   calcium carbonate 500 MG chewable tablet Commonly known as:  TUMS - dosed in mg elemental calcium Chew 1-2 tablets by mouth daily.   flintstones complete 60 MG chewable tablet Chew 2 tablets by mouth daily.   ibuprofen 600 MG tablet Commonly known as:  ADVIL,MOTRIN Take 1 tablet (600 mg total) by mouth every 6 (six) hours.   oxyCODONE-acetaminophen 5-325 MG tablet Commonly known as:  PERCOCET Take 1-2 tablets by mouth every 6 (six) hours as needed for severe pain.       Diet: No evidence of DVT seen on physical exam.  Activity: Advance as tolerated. Pelvic rest for 6 weeks.   Outpatient follow up:4 weeks postpartum, 1 week skin incision check Follow up Appt: Future Appointments  Date Time Provider Department Center  04/17/2017  1:00 PM [facility] [place] [facility] None  04/17/2017  1:45 PM Eure, Amaryllis Dyke, MD [facility]   Follow up Visit:No follow-ups on file.  Postpartum contraception: Nexplanon  Newborn Data: Live born female  Birth Weight: 9 lb 0.1 oz (4085 g) APGAR: 10, 10  Newborn Delivery   Birth date/time:  04/13/2017  20:13:00 Delivery type:  C-Section, Low Transverse C-section categorization:  Primary     Baby Feeding: Breast Disposition:home with mother

## 2017-04-15 NOTE — Clinical Social Work Maternal (Signed)
CLINICAL SOCIAL WORK MATERNAL/CHILD NOTE  Patient Details  Name: PAMLEA FINDER MRN: [id number] Date of Birth: 11/13/1990  Date:  04/15/2017  Clinical Social Worker Initiating Note:  Madilyn Fireman, MSW, LCSW-A Date/Time: Initiated:  04/15/17/1356     Child's Name:  Rosaria Ferries   Biological Parents:  Mother   Need for Interpreter:  None   Reason for Referral:  Behavioral Health Concerns   Address:  [place] [address]    Phone number:  [phone] (home)     Additional phone number  Household Members/Support Persons (HM/SP):   Household Member/Support Person 1   HM/SP Name Relationship DOB or Age  HM/SP -80 Anselm Jungling Father    HM/SP -2        HM/SP -3        HM/SP -4        HM/SP -5        HM/SP -6        HM/SP -7        HM/SP -8          Natural Supports (not living in the home):  Friends, Extended Family, Immediate Family   Professional Supports:     Employment: Animator   Type of Work:     Education:  Programmer, systems   Homebound arranged:    Museum/gallery curator Resources:  Medicaid   Other Resources:      Cultural/Religious Considerations Which May Impact Care:  None  Strengths:  Ability to meet basic needs , Home prepared for child , Pediatrician chosen   Psychotropic Medications:   None      Pediatrician:    [employer]  Pediatrician List:   [place]      Pediatrician Fax Number:    Risk Factors/Current Problems:      Cognitive State:  Alert , Able to Concentrate    Mood/Affect:  Comfortable , Calm    CSW Assessment: CSW met with patient, newborn, and maternal grandparents. CSW received consult on patient for flat affect, which CSW did not witness or observe during interaction. Upon arrival, patient had infant skin to skin on her chest while Manuela Schwartz, RN was preparing the bassinet for  light therapy. Patient and CSW discussed that this is her first child, named [place]. Infant will sleep in a crib alone, CSW educated patient on SIDS reduction and safe sleeping. Patient stated that she did not receive WIC during pregnancy, but CSW educated her on services provided and how to access that service if needs arise in the future. Patient stated that her newborn will receive pediatric care at [facility]. Patient has no documented history of mental illness or substance abuse. CSW inquired about current mental health and patient stated she was not having any symptoms at this time. Patient was soft spoken and pleasant. Patient expressed having good support from her parents at home. CSW educated patient about baby blues period versus postpartum depression and encouraged patient to reach out for help if symptoms begin to arise, stated understanding. Patient inquired about infant's Medicaid status, CSW informed her that infant will be covered under her existing plan for the first 90 days of life and that she should contact her Medicaid worker on Monday to notify them of the birth.   CSW Plan/Description:  Sudden Infant Death Syndrome (SIDS) Education, Perinatal Mood  and Anxiety Disorder (PMADs) Education, No Further Intervention Required/No Barriers to Discharge    Archie Endo, [place] 04/15/2017, 1:59 PM

## 2017-04-16 ENCOUNTER — Telehealth: Payer: Self-pay | Admitting: *Deleted

## 2017-04-16 MED ORDER — BISACODYL 10 MG RE SUPP
10.0000 mg | Freq: Once | RECTAL | Status: AC
  Administered 2017-04-16: 10 mg via RECTAL
  Filled 2017-04-16: qty 1

## 2017-04-16 MED ORDER — MEASLES, MUMPS & RUBELLA VAC ~~LOC~~ INJ
0.5000 mL | INJECTION | Freq: Once | SUBCUTANEOUS | Status: DC
  Filled 2017-04-16: qty 0.5

## 2017-04-16 NOTE — Telephone Encounter (Signed)
Called pt to set up postpartum appointment.  No voicemail so unable to leave message.  04-16-17  AS

## 2017-04-16 NOTE — Lactation Note (Addendum)
This note was copied from a baby's chart. Lactation Consultation Note  Patient Name: [name] Brown Today's Date: 04/16/2017   P1, Baby 62 hours old.   Mother has abrasions/blisters on tips of nipples. Mother has coconut oil. Suggest alternating with shells/coconut oil and comfort gels. Mom has my # to call for assist w/next feeding. Answered questions about pumping. Mom encouraged to feed baby 8-12 times/24 hours and with feeding cues.   Returned to room to observe feeding. Mother was able to hand express good flow before latching. Mother latched baby in football hold with minimal pain or assistance. Sucks and swallows observed and heard. Praised mother for her efforts. Suggest apply comfort gels after breastfeeding with bra. Reviewed engorgement care and monitoring voids/stools.       Maternal Data    Feeding Feeding Type: Breast Fed Length of feed: 15 min  LATCH Score Latch: Grasps breast easily, tongue down, lips flanged, rhythmical sucking.  Audible Swallowing: A few with stimulation  Type of Nipple: Everted at rest and after stimulation  Comfort (Breast/Nipple): Filling, red/small blisters or bruises, mild/mod discomfort  Hold (Positioning): Assistance needed to correctly position infant at breast and maintain latch.  LATCH Score: 7  Interventions    Lactation Tools Discussed/Used     Consult Status      [name] Brown, [name] Brown [facility] 04/16/2017, 10:30 AM

## 2017-04-16 NOTE — Discharge Summary (Addendum)
OB Discharge Summary    Patient Name: [name] Brown DOB: 12/17/1990 MRN: [id number]  Date of admission: 04/12/2017 Delivering MD: [place] BingPICKENS, CHARLIE   Date of discharge: 04/16/2017  Admitting diagnosis: 39.5WKS WATER BROKE Intrauterine pregnancy: 3167w6d     Secondary diagnosis:  Principal Problem:   Status post primary low transverse cesarean section Active Problems:   Supervision of normal first pregnancy   PROM (premature rupture of membranes)   Anemia in pregnancy  Additional problems: None  Discharge diagnosis: Term Pregnancy Delivered                                                                                                Post partum procedures: None  Augmentation: Pitocin and Foley Balloon  Complications: None  Hospital course:  Onset of Labor With Unplanned C/S  27 y.o. yo G1P1001 at 8167w6d was admitted in Latent Labor on 04/12/2017. Patient had a mostly uncomplicated labor course but had poor effort with pushing and requested C/S. Membrane Rupture Time/Date: 8:30 PM ,04/12/2017   The patient went for cesarean section due to Elective Primary, and delivered a Viable infant,04/13/2017  Details of operation can be found in separate operative note. Patient had an uncomplicated postpartum course.  She is ambulating,tolerating a regular diet, passing flatus, and urinating well.  Patient is discharged home in stable condition 04/16/17.  Physical exam  Vitals:   04/14/17 1725 04/15/17 0519 04/15/17 1727 04/16/17 0540  BP: 103/62 107/66 116/67 121/73  Pulse: 82 87 77 82  Resp: 20 18 20 20   Temp: 97.8 F (36.6 C) 98 F (36.7 C) 98.1 F (36.7 C) 98.3 F (36.8 C)  TempSrc:  Oral  Oral  SpO2:      Weight:      Height:       General: alert and cooperative Lochia: appropriate Uterine Fundus: firm Incision: Dressing is clean, dry, and intact DVT Evaluation: No evidence of DVT seen on physical exam. Labs: Lab Results  Component Value Date   WBC 33.0 (H) 04/14/2017   HGB  7.0 (L) 04/14/2017   HCT 22.3 (L) 04/14/2017   MCV 81.1 04/14/2017   PLT 281 04/14/2017   CMP Latest Ref Rng & Units 04/12/2017  Glucose 65 - 99 mg/dL 578(I104(H)  BUN 6 - 20 mg/dL 9  Creatinine 6.960.44 - 2.951.00 mg/dL 2.840.61  Sodium 132135 - 440145 mmol/L 133(L)  Potassium 3.5 - 5.1 mmol/L 3.9  Chloride 101 - 111 mmol/L 108  CO2 22 - 32 mmol/L 16(L)  Calcium 8.9 - 10.3 mg/dL 9.0  Total Protein 6.5 - 8.1 g/dL 1.0(U5.6(L)  Total Bilirubin 0.3 - 1.2 mg/dL 0.4  Alkaline Phos 38 - 126 U/L 184(H)  AST 15 - 41 U/L 23  ALT 14 - 54 U/L 16    Discharge instruction: per After Visit Summary and "Baby and Me Booklet".  After visit meds:  Allergies as of 04/16/2017      Reactions   Mineral Oil Rash   Baby oil      Medication List    STOP taking these medications   ferrous sulfate 325 (65 FE)  MG tablet     TAKE these medications   calcium carbonate 500 MG chewable tablet Commonly known as:  TUMS - dosed in mg elemental calcium Chew 1-2 tablets by mouth daily.   flintstones complete 60 MG chewable tablet Chew 2 tablets by mouth daily.   ibuprofen 600 MG tablet Commonly known as:  ADVIL,MOTRIN Take 1 tablet (600 mg total) by mouth every 6 (six) hours.   oxyCODONE-acetaminophen 5-325 MG tablet Commonly known as:  PERCOCET Take 1-2 tablets by mouth every 6 (six) hours as needed for severe pain.      Diet: routine diet  Activity: Advance as tolerated. Pelvic rest for 6 weeks.   Outpatient follow up:1 week for skin incision check, 4 weeks postpartum Follow up Appt: Future Appointments  Date Time Provider Department Center  04/17/2017  1:00 PM [facility] [place] [facility] None  04/17/2017  1:45 PM Eure, Amaryllis Dyke, MD [facility]   Follow up Visit:No follow-ups on file.  Postpartum contraception: Nexplanon  Newborn Data: Live born female  Birth Weight: 9 lb 0.1 oz (4085 g) APGAR: 10, 10  Newborn Delivery   Birth date/time:  04/13/2017 20:13:00 Delivery type:  C-Section, Low  Transverse C-section categorization:  Primary     Baby Feeding: Breast Disposition:home with mother  04/16/2017 Ellwood Dense, DO  OB FELLOW DISCHARGE ATTESTATION  I have seen and examined this patient. I agree with above documentation and have made edits as needed.   Caryl Ada, DO OB Fellow 11:42 AM

## 2017-04-17 ENCOUNTER — Other Ambulatory Visit: Payer: Medicaid Other

## 2017-04-17 ENCOUNTER — Encounter: Payer: Medicaid Other | Admitting: Obstetrics & Gynecology

## 2017-04-19 ENCOUNTER — Telehealth: Payer: Self-pay | Admitting: *Deleted

## 2017-04-19 ENCOUNTER — Ambulatory Visit (INDEPENDENT_AMBULATORY_CARE_PROVIDER_SITE_OTHER): Payer: Medicaid Other | Admitting: Obstetrics and Gynecology

## 2017-04-19 ENCOUNTER — Encounter: Payer: Self-pay | Admitting: Obstetrics and Gynecology

## 2017-04-19 VITALS — BP 136/80 | HR 84 | Ht 64.0 in | Wt 174.6 lb

## 2017-04-19 DIAGNOSIS — D62 Acute posthemorrhagic anemia: Secondary | ICD-10-CM

## 2017-04-19 LAB — POCT HEMOGLOBIN: HEMOGLOBIN: 7.8 g/dL — AB (ref 12.2–16.2)

## 2017-04-19 MED ORDER — HYDROCHLOROTHIAZIDE 25 MG PO TABS: 25.0000 mg | ORAL_TABLET | Freq: Every day | ORAL | 0 refills | Status: DC

## 2017-04-19 NOTE — Telephone Encounter (Signed)
[facility] called to report low hgb on patient. Results were 8.1 and 7.7. Pt seen in office today and hgb checked.

## 2017-04-19 NOTE — Addendum Note (Signed)
Addended by: Tilda BurrowFERGUSON, Tonee Silverstein V on: 04/19/2017 03:06 PM   Modules accepted: Level of Service

## 2017-04-19 NOTE — Progress Notes (Signed)
   Subjective:  [name] Brown is a 27 y.o. female now 6 days status post C/S.    Review of Systems Negative except some mild pain around incision site. She has a red rash around her stomach from the hospital underwear. She is both breastfeeding and bottle-feeding. She also notes mild edema around her ankles.   Diet:   normal   Bowel movements : normal.  The patient is not having any pain.  Objective:  BP 136/80 (BP Location: Right Arm, Patient Position: Sitting, Cuff Size: Normal)   Pulse 84   Ht 5\' 4"  (1.626 m)   Wt 174 lb 9.6 oz (79.2 kg)   Breastfeeding? Yes   BMI 29.97 kg/m  General:Well developed, well nourished.  No acute distress. Abdomen: Bowel sounds normal, soft, non-tender. Pelvic Exam: Not indicated  Incision(s):   Healing well, no drainage, no erythema, no hernia, no swelling, no dehiscence,     Assessment:  Post-Op 8 days s/p C/S    Doing well postoperatively. Last dressing removed. bandage applied to incision site where skin edges have not come together over a 4 mm ridge of incision asymmetry at left end of incision.   Plan:  1.Wound care discussed 2. current medications: Iron tablets, percocet's. Will treat 1 week of HCTZ 1 tablet a day 3. Activity restrictions: no lifting more than 25 pounds 4. return to work: 4 weeks. 5. Follow up in 4 weeks.   By signing my name below, I, Izna Ahmed, attest that this documentation has been prepared under the direction and in the presence of Tilda BurrowFerguson, Aydrien Froman V, MD. Electronically Signed: Redge GainerIzna Ahmed, Medical Scribe. 04/19/17. 2:50 PM.  I personally performed the services described in this documentation, which was SCRIBED in my presence. The recorded information has been reviewed and considered accurate. It has been edited as necessary during review. Tilda BurrowJohn V Anaja Monts, MD

## 2017-04-21 ENCOUNTER — Inpatient Hospital Stay (HOSPITAL_COMMUNITY): Admission: RE | Admit: 2017-04-21 | Payer: Medicaid Other | Source: Ambulatory Visit

## 2017-04-23 ENCOUNTER — Telehealth: Payer: Self-pay | Admitting: *Deleted

## 2017-04-23 ENCOUNTER — Other Ambulatory Visit: Payer: Medicaid Other

## 2017-04-23 DIAGNOSIS — R300 Dysuria: Secondary | ICD-10-CM

## 2017-04-23 NOTE — Telephone Encounter (Signed)
SPoke to patient and advised to come leave urine same for culture. Patient stated she would come in a couple of hours.

## 2017-04-23 NOTE — Telephone Encounter (Signed)
Patient states she passed a quarter sized clot but bleeding is not heavy.  Advised patient to continue to monitor and if bleeding increases, call us or [facility]  Patient also states she is having burning with urination and some itching.  Recent c/s on 3/22. Concerned she could have an UTI. Please advise.

## 2017-04-23 NOTE — Telephone Encounter (Signed)
Erroneous encounter

## 2017-04-25 ENCOUNTER — Telehealth: Payer: Self-pay | Admitting: *Deleted

## 2017-04-25 ENCOUNTER — Ambulatory Visit (INDEPENDENT_AMBULATORY_CARE_PROVIDER_SITE_OTHER): Payer: Medicaid Other | Admitting: Women's Health

## 2017-04-25 ENCOUNTER — Encounter: Payer: Self-pay | Admitting: Women's Health

## 2017-04-25 VITALS — BP 140/90 | HR 99 | Ht 64.0 in | Wt 160.5 lb

## 2017-04-25 DIAGNOSIS — N760 Acute vaginitis: Secondary | ICD-10-CM | POA: Diagnosis not present

## 2017-04-25 DIAGNOSIS — N898 Other specified noninflammatory disorders of vagina: Secondary | ICD-10-CM

## 2017-04-25 DIAGNOSIS — O165 Unspecified maternal hypertension, complicating the puerperium: Secondary | ICD-10-CM | POA: Diagnosis not present

## 2017-04-25 DIAGNOSIS — B9689 Other specified bacterial agents as the cause of diseases classified elsewhere: Secondary | ICD-10-CM

## 2017-04-25 LAB — POCT WET PREP (WET MOUNT)
CLUE CELLS WET PREP WHIFF POC: POSITIVE
TRICHOMONAS WET PREP HPF POC: ABSENT

## 2017-04-25 LAB — URINE CULTURE: Organism ID, Bacteria: NO GROWTH

## 2017-04-25 MED ORDER — METRONIDAZOLE 500 MG PO TABS: 500.0000 mg | ORAL_TABLET | Freq: Two times a day (BID) | ORAL | 0 refills | Status: DC

## 2017-04-25 MED ORDER — HYDROCHLOROTHIAZIDE 25 MG PO TABS: 25.0000 mg | ORAL_TABLET | Freq: Every day | ORAL | 0 refills | Status: DC

## 2017-04-25 NOTE — Patient Instructions (Signed)
Stop hydrochlorothiazide 2 days before your postpartum visit

## 2017-04-25 NOTE — Telephone Encounter (Signed)
Informed patient urine culture was negative but states she is still having vaginal itching. She has not tried anything over the counter because she was told nothing in the vagina for 6 weeks. She also stated her bleeding is dark brown but "smells like something is dead".  Informed she may need to be seen for this but please advise.

## 2017-04-25 NOTE — Telephone Encounter (Signed)
Informed patient she needed to be seen.  Appt available for this afternoon.  Pt to come.

## 2017-04-25 NOTE — Progress Notes (Signed)
GYN VISIT Patient name: [name] Brown MRN [id number]  Date of birth: [birth year] Chief Complaint:   vaginal discharge with odor (started Sunday)  History of Present Illness:   [name] Brown is a 27 y.o. G40P1001 Caucasian female 2wks s/p PLTCS being seen today for report of vaginal discharge w/ odor and itching since Sunday. Bottlefeeding. Taking HCTZ 25mg  daily for peripheral edema, hasn't taken today. No problems w/ bp during pregnancy or pp until today. Denies ha, visual changes, ruq/epigastric pain, n/v.       No LMP recorded. Review of Systems:   Pertinent items are noted in HPI Denies fever/chills, dizziness, headaches, visual disturbances, fatigue, shortness of breath, chest pain, abdominal pain, vomiting, abnormal vaginal discharge/itching/odor/irritation, problems with periods, bowel movements, urination, or intercourse unless otherwise stated above.  Pertinent History Reviewed:  Reviewed past medical,surgical, social, obstetrical and family history.  Reviewed problem list, medications and allergies. Physical Assessment:   Vitals:   04/25/17 1503  BP: 140/90  Pulse: 99  Weight: 160 lb 8 oz (72.8 kg)  Height: 5\' 4"  (1.626 m)  Body mass index is 27.55 kg/m.       Physical Examination:   General appearance: alert, well appearing, and in no distress  Mental status: alert, oriented to person, place, and time  Skin: warm & dry   Cardiovascular: normal heart rate noted  Respiratory: normal respiratory effort, no distress  Abdomen: soft, non-tender, c/s healing well   Pelvic: VULVA: normal appearing vulva with no masses, tenderness or lesions, VAGINA: normal appearing vagina with normal color and malodorous menstrual colored blood/discharge, no lesions, CERVIX: normal appearing cervix without discharge or lesions  Extremities: no edema   Results for orders placed or performed in visit on 04/25/17 (from the past 24 hour(s))  POCT Wet Prep [facility])   Collection Time: 04/25/17   3:33 PM  Result Value Ref Range   Source Wet Prep POC vaginal    WBC, Wet Prep HPF POC few    Bacteria Wet Prep HPF POC Few Few   BACTERIA WET PREP MORPHOLOGY POC     Clue Cells Wet Prep HPF POC Moderate (A) None   Clue Cells Wet Prep Whiff POC Positive Whiff    Yeast Wet Prep HPF POC None    KOH Wet Prep POC     Trichomonas Wet Prep HPF POC Absent Absent    Assessment & Plan:  1) 2wk s/p PLTCS> bottlefeeding  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 Refilled rx   3) BV> Rx metronidazole 500mg  BID x 7d for BV, no sex or etoh while taking   Meds:  Meds ordered this encounter  Medications  . metroNIDAZOLE (FLAGYL) 500 MG tablet    Sig: Take 1 tablet (500 mg total) by mouth 2 (two) times daily.    Dispense:  14 tablet    Refill:  0    Order Specific Question:   Supervising Provider    Answer:   Despina Hidden, LUTHER H [[id number]]  . hydrochlorothiazide (HYDRODIURIL) 25 MG tablet    Sig: Take 1 tablet (25 mg total) by mouth daily.    Dispense:  20 tablet    Refill:  0    Order Specific Question:   Supervising Provider    Answer:   Duane Lope H [[id number]]    Orders Placed This Encounter  Procedures  . POCT Wet Prep [facility])    Return for As scheduled for pp visit.  Cheral MarkerKimberly R Booker CNM, [facility] Center[facility]NP-BC 04/25/2017 3:35 PM

## 2017-05-04 ENCOUNTER — Other Ambulatory Visit: Payer: Self-pay

## 2017-05-04 ENCOUNTER — Ambulatory Visit (INDEPENDENT_AMBULATORY_CARE_PROVIDER_SITE_OTHER): Payer: Medicaid Other | Admitting: Obstetrics and Gynecology

## 2017-05-04 ENCOUNTER — Encounter: Payer: Self-pay | Admitting: Obstetrics and Gynecology

## 2017-05-04 VITALS — BP 112/78 | HR 104 | Ht 64.0 in | Wt 156.2 lb

## 2017-05-04 DIAGNOSIS — L739 Follicular disorder, unspecified: Secondary | ICD-10-CM

## 2017-05-04 NOTE — Patient Instructions (Signed)

## 2017-05-04 NOTE — Progress Notes (Signed)
[facility] Visit  @DATE @            Patient name: [name] Brown MRN [id number]  Date of birth: 06/15/1990  CC & HPI:  [name] Brown is a 27 y.o. female presenting today for incision check s/p c-section performed on 04/13/2017 by Dr. Kendall Bingharlie Pickens. She notes that she has been picking the glue off the incision site. She also notes that towards the end of the abx for her recent dx of BV, she started to develop geographic tongue. She denies any other symptoms.   She found a tender area just below inciison, in hair bearing tissues. No fever She is on HCTZ and voices if she can d/c and breastfeed at this time.    ROS:  ROS + incision check All systems are negative except as noted in the HPI and PMH.   Pertinent History Reviewed:   Reviewed: Significant for chronic abdominal pain Medical         Past Medical History:  Diagnosis Date  . Chronic abdominal pain   . Colitis   . Counseling for birth control, oral contraceptives 06/26/2014  . Medical history non-contributory   . Missed period 06/26/2014                              Surgical Hx:    Past Surgical History:  Procedure Laterality Date  . CESAREAN SECTION N/A 04/13/2017   Procedure: CESAREAN SECTION;  Surgeon: [place] BingPickens, Charlie, MD;  [facility];  Service: Obstetrics;  Laterality: N/A;  . NO PAST SURGERIES     Medications: Reviewed & Updated - see associated section                       Current Outpatient Medications:  .  flintstones complete (FLINTSTONES) 60 MG chewable tablet, Chew 2 tablets by mouth daily., Disp: , Rfl:  .  hydrochlorothiazide (HYDRODIURIL) 25 MG tablet, Take 1 tablet (25 mg total) by mouth daily., Disp: 20 tablet, Rfl: 0 .  IRON PO, Take by mouth daily., Disp: , Rfl:    Social History: Reviewed -  reports that she has never smoked. She has never used smokeless tobacco.  Objective Findings:  Vitals: Blood pressure 112/78, pulse (!) 104, height 5\' 4"  (1.626 m), weight 156 lb 3.2  oz (70.9 kg), not currently breastfeeding.  PHYSICAL EXAMINATION General appearance - alert, well appearing, and in no distress Mental status - alert, oriented to person, place, and time Chest - clear to auscultation, no wheezes, rales or rhonchi, symmetric air entry Heart - normal rate, regular rhythm, normal S1, S2, no murmurs, rubs, clicks or gallops Abdomen - soft, nontender, nondistended, no masses or organomegaly Breasts - breasts appear normal, no suspicious masses, no skin or nipple changes or axillary nodes Skin - normal coloration and turgor, no rashes, no suspicious skin lesions noted Well healed incision with glue still in place, most of the glue removed revealing a small folliculitis just below the incision which is opened using a 25g needle, able to express small amount of superficial infection.    Assessment & Plan:   A:  1.  Folliculitis of the mons pubis, week 3 s/p C-section  P:  1.  d/c HCTZ at this time.  2. Use topical neosporin PRN 3. Continue follow up with Selena BattenKim booker CNM  By signing my name below, I, Soijett Blue, attest that this documentation  has been prepared under the direction and in the presence of Tilda Burrow, MD. Electronically Signed: Soijett Blue, ED Scribe. 05/04/17. 11:05 AM.  I personally performed the services described in this documentation, which was SCRIBED in my presence. The recorded information has been reviewed and considered accurate. It has been edited as necessary during review. Tilda Burrow, MD

## 2017-05-21 ENCOUNTER — Ambulatory Visit (INDEPENDENT_AMBULATORY_CARE_PROVIDER_SITE_OTHER): Payer: Medicaid Other | Admitting: Women's Health

## 2017-05-21 ENCOUNTER — Encounter: Payer: Self-pay | Admitting: Women's Health

## 2017-05-21 DIAGNOSIS — F419 Anxiety disorder, unspecified: Secondary | ICD-10-CM | POA: Diagnosis not present

## 2017-05-21 DIAGNOSIS — F53 Postpartum depression: Secondary | ICD-10-CM | POA: Diagnosis not present

## 2017-05-21 DIAGNOSIS — O99345 Other mental disorders complicating the puerperium: Secondary | ICD-10-CM | POA: Diagnosis not present

## 2017-05-21 DIAGNOSIS — Z98891 History of uterine scar from previous surgery: Secondary | ICD-10-CM

## 2017-05-21 MED ORDER — ESCITALOPRAM OXALATE 10 MG PO TABS: 10.0000 mg | ORAL_TABLET | Freq: Every day | ORAL | 6 refills | Status: DC

## 2017-05-21 NOTE — Patient Instructions (Signed)
NO SEX UNTIL AFTER YOU GET YOUR BIRTH CONTROL   Etonogestrel implant What is this medicine? ETONOGESTREL (et oh noe JES trel) is a contraceptive (birth control) device. It is used to prevent pregnancy. It can be used for up to 3 years. This medicine may be used for other purposes; ask your health care provider or pharmacist if you have questions. COMMON BRAND NAME(S): Implanon, Nexplanon What should I tell my health care provider before I take this medicine? They need to know if you have any of these conditions: -abnormal vaginal bleeding -blood vessel disease or blood clots -cancer of the breast, cervix, or liver -depression -diabetes -gallbladder disease -headaches -heart disease or recent heart attack -high blood pressure -high cholesterol -kidney disease -liver disease -renal disease -seizures -tobacco smoker -an unusual or allergic reaction to etonogestrel, other hormones, anesthetics or antiseptics, medicines, foods, dyes, or preservatives -pregnant or trying to get pregnant -breast-feeding How should I use this medicine? This device is inserted just under the skin on the inner side of your upper arm by a health care professional. Talk to your pediatrician regarding the use of this medicine in children. Special care may be needed. Overdosage: If you think you have taken too much of this medicine contact a poison control center or emergency room at once. NOTE: This medicine is only for you. Do not share this medicine with others. What if I miss a dose? This does not apply. What may interact with this medicine? Do not take this medicine with any of the following medications: -amprenavir -bosentan -fosamprenavir This medicine may also interact with the following medications: -barbiturate medicines for inducing sleep or treating seizures -certain medicines for fungal infections like ketoconazole and itraconazole -grapefruit juice -griseofulvin -medicines to treat  seizures like carbamazepine, felbamate, oxcarbazepine, phenytoin, topiramate -modafinil -phenylbutazone -rifampin -rufinamide -some medicines to treat HIV infection like atazanavir, indinavir, lopinavir, nelfinavir, tipranavir, ritonavir -St. John's wort This list may not describe all possible interactions. Give your health care provider a list of all the medicines, herbs, non-prescription drugs, or dietary supplements you use. Also tell them if you smoke, drink alcohol, or use illegal drugs. Some items may interact with your medicine. What should I watch for while using this medicine? This product does not protect you against HIV infection (AIDS) or other sexually transmitted diseases. You should be able to feel the implant by pressing your fingertips over the skin where it was inserted. Contact your doctor if you cannot feel the implant, and use a non-hormonal birth control method (such as condoms) until your doctor confirms that the implant is in place. If you feel that the implant may have broken or become bent while in your arm, contact your healthcare provider. What side effects may I notice from receiving this medicine? Side effects that you should report to your doctor or health care professional as soon as possible: -allergic reactions like skin rash, itching or hives, swelling of the face, lips, or tongue -breast lumps -changes in emotions or moods -depressed mood -heavy or prolonged menstrual bleeding -pain, irritation, swelling, or bruising at the insertion site -scar at site of insertion -signs of infection at the insertion site such as fever, and skin redness, pain or discharge -signs of pregnancy -signs and symptoms of a blood clot such as breathing problems; changes in vision; chest pain; severe, sudden headache; pain, swelling, warmth in the leg; trouble speaking; sudden numbness or weakness of the face, arm or leg -signs and symptoms of liver injury like dark yellow   or brown  urine; general ill feeling or flu-like symptoms; light-colored stools; loss of appetite; nausea; right upper belly pain; unusually weak or tired; yellowing of the eyes or skin -unusual vaginal bleeding, discharge -signs and symptoms of a stroke like changes in vision; confusion; trouble speaking or understanding; severe headaches; sudden numbness or weakness of the face, arm or leg; trouble walking; dizziness; loss of balance or coordination Side effects that usually do not require medical attention (report to your doctor or health care professional if they continue or are bothersome): -acne -back pain -breast pain -changes in weight -dizziness -general ill feeling or flu-like symptoms -headache -irregular menstrual bleeding -nausea -sore throat -vaginal irritation or inflammation This list may not describe all possible side effects. Call your doctor for medical advice about side effects. You may report side effects to FDA at 1-800-FDA-1088. Where should I keep my medicine? This drug is given in a hospital or clinic and will not be stored at home. NOTE: This sheet is a summary. It may not cover all possible information. If you have questions about this medicine, talk to your doctor, pharmacist, or health care provider.  2018 Elsevier/Gold Standard (2015-07-29 11:19:22)  Postpartum Depression and Baby Blues The postpartum period begins right after the birth of a baby. During this time, there is often a great amount of joy and excitement. It is also a time of many changes in the life of the parents. Regardless of how many times a mother gives birth, each child brings new challenges and dynamics to the family. It is not unusual to have feelings of excitement along with confusing shifts in moods, emotions, and thoughts. All mothers are at risk of developing postpartum depression or the "baby blues." These mood changes can occur right after giving birth, or they may occur many months after giving  birth. The baby blues or postpartum depression can be mild or severe. Additionally, postpartum depression can go away rather quickly, or it can be a long-term condition. What are the causes? Raised hormone levels and the rapid drop in those levels are thought to be a main cause of postpartum depression and the baby blues. A number of hormones change during and after pregnancy. Estrogen and progesterone usually decrease right after the delivery of your baby. The levels of thyroid hormone and various cortisol steroids also rapidly drop. Other factors that play a role in these mood changes include major life events and genetics. What increases the risk? If you have any of the following risks for the baby blues or postpartum depression, know what symptoms to watch out for during the postpartum period. Risk factors that may increase the likelihood of getting the baby blues or postpartum depression include:  Having a personal or family history of depression.  Having depression while being pregnant.  Having premenstrual mood issues or mood issues related to oral contraceptives.  Having a lot of life stress.  Having marital conflict.  Lacking a social support network.  Having a baby with special needs.  Having health problems, such as diabetes.  What are the signs or symptoms? Symptoms of baby blues include:  Brief changes in mood, such as going from extreme happiness to sadness.  Decreased concentration.  Difficulty sleeping.  Crying spells, tearfulness.  Irritability.  Anxiety.  Symptoms of postpartum depression typically begin within the first month after giving birth. These symptoms include:  Difficulty sleeping or excessive sleepiness.  Marked weight loss.  Agitation.  Feelings of worthlessness.  Lack of interest in activity  or food.  Postpartum psychosis is a very serious condition and can be dangerous. Fortunately, it is rare. Displaying any of the following symptoms  is cause for immediate medical attention. Symptoms of postpartum psychosis include:  Hallucinations and delusions.  Bizarre or disorganized behavior.  Confusion or disorientation.  How is this diagnosed? A diagnosis is made by an evaluation of your symptoms. There are no medical or lab tests that lead to a diagnosis, but there are various questionnaires that a health care provider may use to identify those with the baby blues, postpartum depression, or psychosis. Often, a screening tool called the [place] Postnatal Depression Scale is used to diagnose depression in the postpartum period. How is this treated? The baby blues usually goes away on its own in 1-2 weeks. Social support is often all that is needed. You will be encouraged to get adequate sleep and rest. Occasionally, you may be given medicines to help you sleep. Postpartum depression requires treatment because it can last several months or longer if it is not treated. Treatment may include individual or group therapy, medicine, or both to address any social, physiological, and psychological factors that may play a role in the depression. Regular exercise, a healthy diet, rest, and social support may also be strongly recommended. Postpartum psychosis is more serious and needs treatment right away. Hospitalization is often needed. Follow these instructions at home:  Get as much rest as you can. Nap when the baby sleeps.  Exercise regularly. Some women find yoga and walking to be beneficial.  Eat a balanced and nourishing diet.  Do little things that you enjoy. Have a cup of tea, take a bubble bath, read your favorite magazine, or listen to your favorite music.  Avoid alcohol.  Ask for help with household chores, cooking, grocery shopping, or running errands as needed. Do not try to do everything.  Talk to people close to you about how you are feeling. Get support from your partner, family members, friends, or other new  moms.  Try to stay positive in how you think. Think about the things you are grateful for.  Do not spend a lot of time alone.  Only take over-the-counter or prescription medicine as directed by your health care provider.  Keep all your postpartum appointments.  Let your health care provider know if you have any concerns. Contact a health care provider if: You are having a reaction to or problems with your medicine. Get help right away if:  You have suicidal feelings.  You think you may harm the baby or someone else. This information is not intended to replace advice given to you by your health care provider. Make sure you discuss any questions you have with your health care provider. Document Released: 10/14/2003 Document Revised: 06/17/2015 Document Reviewed: 10/21/2012 Elsevier Interactive Patient Education  2017 [employer].

## 2017-05-21 NOTE — Progress Notes (Signed)
POSTPARTUM VISIT Patient name: [name] Brown MRN [id number]  Date of birth: Oct 31, 1990 Chief Complaint:   Postpartum Care (not sure about bc)  History of Present Illness:   [name] Brown is a 27 y.o. G17P1001 Caucasian female being seen today for a postpartum visit. She is 5 weeks postpartum following a primary cesarean section, low transverse incision at 39.6 gestational week, d/t maternal fatigue/request during 2nd stages. Anesthesia: epidural. I have fully reviewed the prenatal and intrapartum course. Pregnancy uncomplicated. 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 Bleeding no bleeding. Bowel function is normal. Bladder function is normal.  Patient is not sexually active. Last sexual activity: prior to birth of baby.  Contraception method is wants Nexplanon, understands can make dep/anx worse.  [place] Postpartum Depression Screening: positive. Score 14. Eating ok, not sleeping well, still finds joy in some things. Denies SI/HI/II. H/O dep/anx, was on meds in past, can't remember what. Has never done counseling, but is interested. Feels she would benefit from restarting meds as well.    Last pap 2018.  Results were normal .  No LMP recorded.  Baby's course has been uncomplicated. Baby is feeding by bottle.  Review of Systems:   Pertinent items are noted in HPI Denies Abnormal vaginal discharge w/ itching/odor/irritation, headaches, visual changes, shortness of breath, chest pain, abdominal pain, severe nausea/vomiting, or problems with urination or bowel movements. Pertinent History Reviewed:  Reviewed past medical,surgical, obstetrical and family history.  Reviewed problem list, medications and allergies. OB History  Gravida Para Term Preterm AB Living  0 0 1  SAB TAB Ectopic Multiple Live Births  0 0 0 0 1    # Outcome Date GA Lbr Len/2nd Weight Sex Delivery Anes PTL Lv  1 Term 04/13/17 [redacted]w[redacted]d 18:01 / 05:42 9  lb 0.1 oz (4.085 kg) F CS-LTranv EPI  LIV   Physical Assessment:   Vitals:   05/21/17 1117  BP: 120/80  Pulse: 98  Weight: 158 lb 6.4 oz (71.8 kg)  Height:  (1.626 m)  Body mass index is 27.19 kg/m.       Physical Examination:   General appearance: alert, well appearing, and in no distress  Mental status: alert, oriented to person, place, and time  Skin: warm & dry   Cardiovascular: normal heart rate noted   Respiratory: normal respiratory effort, no distress   Breasts: deferred, no complaints   Abdomen: soft, non-tender, c/s incision well-healed, she feels there is a hair stuck in incision, got tweezers, all hairs are free from incision.    Pelvic: VULVA: normal appearing vulva with no masses, tenderness or lesions, UTERUS: uterus is normal size, shape, consistency and nontender  Rectal: no hemorrhoids  Extremities: no edema       No results found for this or any previous visit (from the past 24 hour(s)).  Assessment & Plan:  1) Postpartum exam 2) 5 wks s/p PLTCS d/t maternal fatigue/request in 2nd stage 3) Bottlefeeding 4) Depression screening 5) PPD/anxiety> rx lexapro  daily, understands can take a few weeks to notice improvement, referral to [facility] for therapy sent 6) Contraception counseling, pt prefers Nexplanon, abstinence until insertion, order today  7) Resolved PPHTN  Meds:  Meds ordered this encounter  Medications  . escitalopram (LEXAPRO) 10 MG tablet    Sig: Take 1 tablet (10 mg total) by mouth daily.    Dispense:  30 tablet    Refill:  6    Order Specific Question:   Supervising Provider    Answer:   Lazaro Arms [[id number]]    Follow-up: Return in about 1 month (around 06/18/2017) for Nexplanon insertion, order today please.   Orders Placed This Encounter  Procedures  . Ambulatory referral to [facility]    Cheral Marker CNM, [facility] 05/21/2017 12:09 PM

## 2017-05-23 ENCOUNTER — Encounter: Payer: Self-pay | Admitting: *Deleted

## 2017-05-23 ENCOUNTER — Telehealth: Payer: Self-pay | Admitting: *Deleted

## 2017-06-19 ENCOUNTER — Ambulatory Visit (INDEPENDENT_AMBULATORY_CARE_PROVIDER_SITE_OTHER): Payer: Medicaid Other | Admitting: Women's Health

## 2017-06-19 ENCOUNTER — Encounter: Payer: Self-pay | Admitting: Women's Health

## 2017-06-19 VITALS — BP 130/84 | HR 73 | Ht 64.0 in | Wt 156.8 lb

## 2017-06-19 DIAGNOSIS — Z3049 Encounter for surveillance of other contraceptives: Secondary | ICD-10-CM | POA: Diagnosis not present

## 2017-06-19 DIAGNOSIS — F418 Other specified anxiety disorders: Secondary | ICD-10-CM

## 2017-06-19 DIAGNOSIS — Z30017 Encounter for initial prescription of implantable subdermal contraceptive: Secondary | ICD-10-CM | POA: Insufficient documentation

## 2017-06-19 DIAGNOSIS — Z3202 Encounter for pregnancy test, result negative: Secondary | ICD-10-CM | POA: Diagnosis not present

## 2017-06-19 LAB — POCT URINE PREGNANCY: PREG TEST UR: NEGATIVE

## 2017-06-19 MED ORDER — ETONOGESTREL 68 MG ~~LOC~~ IMPL
68.0000 mg | DRUG_IMPLANT | Freq: Once | SUBCUTANEOUS | Status: AC
  Administered 2017-06-19: 68 mg via SUBCUTANEOUS

## 2017-06-19 NOTE — Progress Notes (Signed)
   NEXPLANON INSERTION Patient name: KENDEL Brown MRN [id number]  Date of birth: 1990-08-23 Subjective Findings:   [name] Brown is a 27 y.o. G70P1001 Caucasian female being seen today for insertion of a Nexplanon. Also reports bump on chest irritating her, came up about prior to baby being born, hasn't gone away, wants it removed. Also hard sore place above Rt side of c/s incision she noticed about 1wk ago. Baby born 04/13/17.  Placed on Lexapro  at pp visit 05/21/17, feels much better, missed call from therapist- hasn't called back. Denies SI/HI/II. EPDS is 9 today, was 14 on 4/29.   Patient's last menstrual period was 06/14/2017. Last sexual intercourse was prior to birth of baby Last pap2018. Results were:  normal  Risks/benefits/side effects of Nexplanon have been discussed and her questions have been answered.  Specifically, a failure rate of 01/998 has been reported, with an increased failure rate if pt takes St. John's Wort and/or antiseizure medicaitons.  She is aware of the common side effect of irregular bleeding, which the incidence of decreases over time. She is also aware it can potentially worsen depression/anxiety- if this happens, let us know. Signed copy of informed consent in chart.  Pertinent History Reviewed:   Reviewed past medical,surgical, social, obstetrical and family history.  Reviewed problem list, medications and allergies. Objective Findings & Procedure:    Vitals:   06/19/17 1547  BP: 130/84  Pulse: 73  Weight: 156 lb 12.8 oz (71.1 kg)  Height:  (1.626 m)  Body mass index is 26.91 kg/m.  Results for orders placed or performed in visit on 06/19/17 (from the past 24 hour(s))  POCT urine pregnancy   Collection Time: 06/19/17  3:49 PM  Result Value Ref Range   Preg Test, Ur Negative Negative     Time out was performed.  She is right-handed, so her left arm, approximately 10cm from the medial epicondyle and 3-5cm posterior to the sulcus, was  cleansed with alcohol and anesthetized with 2cc of 2% Lidocaine.  The area was cleansed again with betadine and the Nexplanon was inserted per manufacturer's recommendations without difficulty.  3 steri-strips and pressure bandage were applied. The patient tolerated the procedure well.   Skin: small mole/skin tag center of chest, co-exam w/ LHE, will bring back for removal  Abd: hardened slightly tender area Rt side of incision that goes up vertically, co-exam w/ LHE- scar tissue  Assessment & Plan:   1) Nexplanon insertion Pt was instructed to keep the area clean and dry, remove pressure bandage in 24 hours, and keep insertion site covered with the steri-strip for 3-5 days.  Back up contraception was recommended for 2 weeks.  She was given a card indicating date Nexplanon was inserted and date it needs to be removed. Follow-up PRN problems.  2) Mole/skin tag> will bring back for removal  3) Incisional scar tissue  4) Dep/anx> improving w/ Lexapro  daily  Orders Placed This Encounter  Procedures  . POCT urine pregnancy    Follow-up: Return for within next few weeks for mole removal w/ LHE.  Cheral Marker CNM, [facility] 06/19/2017 5:10 PM

## 2017-06-19 NOTE — Patient Instructions (Signed)

## 2017-06-27 ENCOUNTER — Encounter: Payer: Self-pay | Admitting: Adult Health

## 2017-06-27 ENCOUNTER — Ambulatory Visit (INDEPENDENT_AMBULATORY_CARE_PROVIDER_SITE_OTHER): Payer: Medicaid Other | Admitting: Adult Health

## 2017-06-27 VITALS — BP 109/68 | HR 83 | Ht 64.0 in | Wt 157.0 lb

## 2017-06-27 DIAGNOSIS — N898 Other specified noninflammatory disorders of vagina: Secondary | ICD-10-CM | POA: Insufficient documentation

## 2017-06-27 DIAGNOSIS — F53 Postpartum depression: Secondary | ICD-10-CM

## 2017-06-27 DIAGNOSIS — O99345 Other mental disorders complicating the puerperium: Secondary | ICD-10-CM

## 2017-06-27 DIAGNOSIS — Z113 Encounter for screening for infections with a predominantly sexual mode of transmission: Secondary | ICD-10-CM | POA: Diagnosis not present

## 2017-06-27 DIAGNOSIS — N760 Acute vaginitis: Secondary | ICD-10-CM

## 2017-06-27 DIAGNOSIS — B9689 Other specified bacterial agents as the cause of diseases classified elsewhere: Secondary | ICD-10-CM

## 2017-06-27 DIAGNOSIS — Z975 Presence of (intrauterine) contraceptive device: Secondary | ICD-10-CM | POA: Insufficient documentation

## 2017-06-27 LAB — POCT WET PREP (WET MOUNT)
Clue Cells Wet Prep Whiff POC: NEGATIVE
WBC, Wet Prep HPF POC: POSITIVE

## 2017-06-27 MED ORDER — ESCITALOPRAM OXALATE 20 MG PO TABS: 20.0000 mg | ORAL_TABLET | Freq: Every day | ORAL | 6 refills | Status: DC

## 2017-06-27 MED ORDER — METRONIDAZOLE 500 MG PO TABS: 500.0000 mg | ORAL_TABLET | Freq: Two times a day (BID) | ORAL | 0 refills | Status: DC

## 2017-06-27 NOTE — Progress Notes (Signed)
  Subjective:     Patient ID: [name] Brown, female   DOB: 07/08/1990, 27 y.o.   MRN: [id number]  HPI [name] Brown is a 27 year old white female, in complaining of vaginal discharge.She had C section 04/13/17 and had nexplanon inserted in May.She has had new sex partner and he told her his ex partner had herpes. She wants STD testing.   Review of Systems +vaginal discharge, no odor, some itching today Has had sex with new partner  Reviewed past medical,surgical, social and family history. Reviewed medications and allergies.     Objective:   Physical Exam BP 109/68 (BP Location: Right Arm, Patient Position: Sitting, Cuff Size: Small)   Pulse 83   Ht 5\' 4"  (1.626 m)   Wt 157 lb (71.2 kg)   LMP 06/14/2017   BMI 26.95 kg/m  Skin warm and dry.Pelvic: external genitalia is normal in appearance no lesions, vagina: white discharge without odor,urethra has no lesions or masses noted, cervix:smooth and bulbous, uterus: normal size, shape and contour, non tender, no masses felt, adnexa: no masses or tenderness noted. Bladder is non tender and no masses felt.  Wet prep: + for clue cells and +WBCs,Nuswabs obtained.   PHQ 9 score 6,is on lexapro, but feels like she is going backwards she says.Will increase lexarpo to 20 mg.  Assessment:     1. Vaginal discharge   2. Screening examination for STD (sexually transmitted disease)   3. BV (bacterial vaginosis)   4. Postpartum depression   5. Nexplanon in place       Plan:    Nuswab sent Check HIV and RPR Use condoms Meds ordered this encounter  Medications  . metroNIDAZOLE (FLAGYL) 500 MG tablet    Sig: Take 1 tablet (500 mg total) by mouth 2 (two) times daily.    Dispense:  14 tablet    Refill:  0    Order Specific Question:   Supervising Provider    Answer:   Despina HiddenEURE, LUTHER H [[id number]]  . escitalopram (LEXAPRO) 20 MG tablet    Sig: Take 1 tablet (20 mg total) by mouth daily.    Dispense:  30 tablet    Refill:  6    Order Specific Question:    Supervising Provider    Answer:   Despina HiddenEURE, LUTHER H [[id number]]  F/U in 8 weeks for ROS

## 2017-06-28 LAB — RPR: RPR Ser Ql: NONREACTIVE

## 2017-06-28 LAB — HIV ANTIBODY (ROUTINE TESTING W REFLEX): HIV SCREEN 4TH GENERATION: NONREACTIVE

## 2017-07-03 ENCOUNTER — Telehealth: Payer: Self-pay | Admitting: Adult Health

## 2017-07-03 DIAGNOSIS — Z113 Encounter for screening for infections with a predominantly sexual mode of transmission: Secondary | ICD-10-CM

## 2017-07-03 NOTE — Telephone Encounter (Signed)
LMOVM

## 2017-07-03 NOTE — Telephone Encounter (Signed)
Patient wants to be tested for herpes.  Does not have an outbreak but thought she was going to be tested at her last visit. Please advise.

## 2017-07-03 NOTE — Telephone Encounter (Signed)
Pt wanted to know is she had been tested for Herpies when she was last here. I did not see where she was, she has FP mcd,  Please check behind me

## 2017-07-03 NOTE — Telephone Encounter (Signed)
Pt called and wants to be check for HSV, she is aware that this test has limitations with false negatives and positives.

## 2017-07-04 LAB — NUSWAB VAGINITIS PLUS (VG+)
ATOPOBIUM VAGINAE: HIGH {score} — AB
Candida albicans, NAA: NEGATIVE
Candida glabrata, NAA: NEGATIVE
Chlamydia trachomatis, NAA: NEGATIVE
Megasphaera 1: HIGH Score — AB
NEISSERIA GONORRHOEAE, NAA: NEGATIVE
Trich vag by NAA: NEGATIVE

## 2017-07-05 ENCOUNTER — Ambulatory Visit: Payer: Medicaid Other | Admitting: Obstetrics & Gynecology

## 2017-07-06 ENCOUNTER — Telehealth: Payer: Self-pay | Admitting: *Deleted

## 2017-07-06 LAB — HSV-2 IGG SUPPLEMENTAL TEST: HSV-2 IGG SUPPLEMENTAL TEST: NEGATIVE

## 2017-07-06 LAB — HSV 2 ANTIBODY, IGG: HSV 2 IgG, Type Spec: 0.97 index — ABNORMAL HIGH (ref 0.00–0.90)

## 2017-07-06 NOTE — Telephone Encounter (Signed)
Pt says she does want to repeat HSV test in 3 months,she will call when wanting to do test.

## 2017-07-30 ENCOUNTER — Telehealth: Payer: Self-pay | Admitting: Adult Health

## 2017-07-30 NOTE — Telephone Encounter (Signed)
Returned patient's call but unable to leave message.

## 2017-07-30 NOTE — Telephone Encounter (Signed)
Patient states she would like to stop taking the Lexapro because she is "at a good place right now." Informed patient that she may be feeling better as the medication is helping her feel better.  Asked if she could be dropped down to 10mg  instead.  Also requested last "test" be repeated? Please advise.

## 2017-07-30 NOTE — Telephone Encounter (Signed)
Patient called stating that she would like [name] to see how she could get winged off of a certain medication and she would like a test to be done as well. Please contact pt

## 2017-07-31 NOTE — Telephone Encounter (Signed)
Pt wants to decrease lexapro and try to come off of it, explained that she is feeling better, because it is working, but can go back to 10 mg of lexapro and keep appt 7/31 in F/U and can reassess then

## 2017-08-15 ENCOUNTER — Encounter: Payer: Medicaid Other | Admitting: Women's Health

## 2017-08-21 ENCOUNTER — Encounter: Payer: Self-pay | Admitting: Advanced Practice Midwife

## 2017-08-21 ENCOUNTER — Ambulatory Visit (INDEPENDENT_AMBULATORY_CARE_PROVIDER_SITE_OTHER): Payer: 59 | Admitting: Advanced Practice Midwife

## 2017-08-21 VITALS — BP 122/77 | HR 91 | Ht 64.0 in | Wt 158.0 lb

## 2017-08-21 DIAGNOSIS — Z113 Encounter for screening for infections with a predominantly sexual mode of transmission: Secondary | ICD-10-CM

## 2017-08-21 DIAGNOSIS — Z3046 Encounter for surveillance of implantable subdermal contraceptive: Secondary | ICD-10-CM

## 2017-08-21 DIAGNOSIS — Z3049 Encounter for surveillance of other contraceptives: Secondary | ICD-10-CM | POA: Diagnosis not present

## 2017-08-21 MED ORDER — NORETHIN-ETH ESTRAD-FE BIPHAS 1 MG-10 MCG / 10 MCG PO TABS: 1.0000 | ORAL_TABLET | Freq: Every day | ORAL | 11 refills | Status: DC

## 2017-08-21 NOTE — Progress Notes (Signed)
HPI:  [name] Brown 27 y.o. here for Nexplanon removal.  Her future plans for birth control are cocs.  Had a baby in march, Nexplanon in May Wants it out d/t hair falling out and cramping. Informed that hair loss could be a SE of having given birth a few months ago. Still wants it out. Is bottlefeeding. LoLoestrin start today. HSV Igg was equivocal a few mo nths ago. Aware of high false + rate, wants retest.  Past Medical History: Past Medical History:  Diagnosis Date  . Chronic abdominal pain   . Colitis   . Counseling for birth control, oral contraceptives 06/26/2014  . Medical history non-contributory   . Missed period 06/26/2014    Past Surgical History: Past Surgical History:  Procedure Laterality Date  . CESAREAN SECTION N/A 04/13/2017   Procedure: CESAREAN SECTION;  Surgeon: [place] BingPickens, Charlie, MD;  [facility];  Service: Obstetrics;  Laterality: N/A;  . NO PAST SURGERIES      Family History: Family History  Problem Relation Age of Onset  . Cancer Mother        cervical  . Alcohol abuse Mother   . Other Father        fatty liver  . Cancer Maternal Grandmother        skin  . Dementia Maternal Grandmother   . Dementia Paternal Grandmother   . Diabetes Paternal Grandfather   . Dementia Paternal Grandfather   . Jaundice Daughter     Social History: Social History   Tobacco Use  . Smoking status: Never Smoker  . Smokeless tobacco: Never Used  Substance Use Topics  . Alcohol use: No    Comment: occ  . Drug use: No    Allergies:  Allergies  Allergen Reactions  . Mineral Oil Rash    Baby oil    Meds:  (Not in a hospital admission)    Patient given informed consent for removal of her Nexplanon, time out was performed.  Signed copy in the chart.  Appropriate time out taken. Implanon site identified.  Area prepped in usual sterile fashon. One cc of 1% lidocaine was used to anesthetize the area at the distal end of the implant. A small stab incision  was made right beside the implant on the distal portion.  The Nexplanon rod was grasped using hemostats and removed without difficulty.  There was less than 3 cc blood loss. There were no complications.  A small amount of antibiotic ointment and steri-strips were applied over the small incision.  A pressure bandage was applied to reduce any bruising.  The patient tolerated the procedure well and was given post procedure instructions.

## 2017-08-22 ENCOUNTER — Ambulatory Visit: Payer: Medicaid Other | Admitting: Adult Health

## 2017-08-22 LAB — HSV 2 ANTIBODY, IGG: HSV 2 IgG, Type Spec: <0.91 index (ref 0.00–0.90)

## 2017-09-05 ENCOUNTER — Emergency Department (HOSPITAL_COMMUNITY): Payer: 59

## 2017-09-05 ENCOUNTER — Encounter (HOSPITAL_COMMUNITY): Payer: Self-pay | Admitting: Emergency Medicine

## 2017-09-05 ENCOUNTER — Other Ambulatory Visit: Payer: Self-pay

## 2017-09-05 ENCOUNTER — Emergency Department (HOSPITAL_COMMUNITY)
Admission: EM | Admit: 2017-09-05 | Discharge: 2017-09-05 | Disposition: A | Discharge status: Home / Self Care | Payer: 59 | Attending: Emergency Medicine | Admitting: Emergency Medicine

## 2017-09-05 DIAGNOSIS — J039 Acute tonsillitis, unspecified: Secondary | ICD-10-CM | POA: Insufficient documentation

## 2017-09-05 DIAGNOSIS — Z79899 Other long term (current) drug therapy: Secondary | ICD-10-CM | POA: Insufficient documentation

## 2017-09-05 DIAGNOSIS — J029 Acute pharyngitis, unspecified: Secondary | ICD-10-CM | POA: Diagnosis present

## 2017-09-05 LAB — URINALYSIS, ROUTINE W REFLEX MICROSCOPIC
Bilirubin Urine: NEGATIVE
GLUCOSE, UA: NEGATIVE mg/dL
Hgb urine dipstick: NEGATIVE
KETONES UR: NEGATIVE mg/dL
LEUKOCYTES UA: NEGATIVE
Nitrite: NEGATIVE
PH: 7 (ref 5.0–8.0)
Protein, ur: NEGATIVE mg/dL
SPECIFIC GRAVITY, URINE: 1.02 (ref 1.005–1.030)

## 2017-09-05 LAB — POC URINE PREG, ED: Preg Test, Ur: NEGATIVE

## 2017-09-05 LAB — GROUP A STREP BY PCR: Group A Strep by PCR: NOT DETECTED

## 2017-09-05 MED ORDER — AMOXICILLIN 500 MG PO CAPS: 500.0000 mg | ORAL_CAPSULE | Freq: Three times a day (TID) | ORAL | 0 refills | Status: AC

## 2017-09-05 MED ORDER — ACETAMINOPHEN 325 MG PO TABS
650.0000 mg | ORAL_TABLET | Freq: Once | ORAL | Status: AC
  Administered 2017-09-05: 650 mg via ORAL
  Filled 2017-09-05: qty 2

## 2017-09-05 MED ORDER — AMOXICILLIN 250 MG PO CAPS
500.0000 mg | ORAL_CAPSULE | Freq: Once | ORAL | Status: AC
  Administered 2017-09-05: 500 mg via ORAL
  Filled 2017-09-05: qty 2

## 2017-09-05 NOTE — Discharge Instructions (Addendum)
Take your entire course of the antibiotics.  Take motrin or tylenol for fever and pain reduction.  Rest and make sure you are drinking plenty of fluids.  Get rechecked for any new or worsening symtpoms.

## 2017-09-05 NOTE — ED Provider Notes (Signed)
[facility] EMERGENCY DEPARTMENT Provider Note   CSN: [id number] Arrival date & time: 09/05/17  1024     History   Chief Complaint Chief Complaint  Patient presents with  . Generalized Body Aches  . Sore Throat    HPI Felipe L Marchia BondCoe is a 27 y.o. female who is currently 5 months postpartum, not breast-feeding, presenting with a 1 day history of flulike symptoms including sore throat, subjective fever, generalized body aches and fatigue.  She also endorses left-sided sinus and nasal congestion.  She has had no cough or shortness of breath she also reports increased urinary frequency.  She completed a course of antibiotics 2 weeks ago for a UTI but feels this may not be completely resolved.  She denies dysuria, hematuria, low back or flank pain. She denies abdominal pain, n/v or diarrhea.  She has had no tx prior to arrival.  The history is provided by the patient.    Past Medical History:  Diagnosis Date  . Chronic abdominal pain   . Colitis   . Counseling for birth control, oral contraceptives 06/26/2014  . Medical history non-contributory   . Missed period 06/26/2014    Patient Active Problem List   Diagnosis Date Noted  . Nexplanon in place 06/27/2017  . 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  . Screening examination for STD (sexually transmitted disease) 06/27/2017  . Vaginal discharge 06/27/2017  . Nexplanon insertion 06/19/2017  . 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/2017  . Status post primary low transverse cesarean section 04/14/2017  . Anemia in pregnancy 04/13/2017  . BV (bacterial vaginosis) 09/11/2016  . Abdominal pain 04/22/2014    Past Surgical History:  Procedure Laterality Date  . CESAREAN SECTION N/A 04/13/2017   Procedure: CESAREAN SECTION;  Surgeon: Beaumont BingPickens, Charlie, MD;  [facility];  Service: Obstetrics;  Laterality: N/A;  . NO PAST SURGERIES       OB History    Gravida  1   Para  1   Term  1   Preterm  0   AB  0   Living  1     SAB   0   TAB  0   Ectopic  0   Multiple  0   Live Births  1            Home Medications    Prior to Admission medications   Medication Sig Start Date End Date Taking? Authorizing Provider  amoxicillin (AMOXIL) 500 MG capsule Take 1 capsule (500 mg total) by mouth 3 (three) times daily for 10 days. 09/05/17 09/15/17  Burgess AmorIdol, Tyris Eliot, PA-C  escitalopram (LEXAPRO) 20 MG tablet Take 1 tablet (20 mg total) by mouth daily. 06/27/17   Adline PotterGriffin, Jennifer A, NP  etonogestrel (NEXPLANON) 68 MG IMPL implant 1 each by Subdermal route once.    [provider]  flintstones complete (FLINTSTONES) 60 MG chewable tablet Chew 2 tablets by mouth daily.    [provider]  metroNIDAZOLE (FLAGYL) 500 MG tablet Take 1 tablet (500 mg total) by mouth 2 (two) times daily. Patient not taking: Reported on 08/21/2017 06/27/17   Adline PotterGriffin, Jennifer A, NP  Norethindrone-Ethinyl Estradiol-Fe Biphas (LO LOESTRIN FE) 1 MG-10 MCG / 10 MCG tablet Take 1 tablet by mouth daily. 08/21/17   Jacklyn Shellresenzo-Dishmon, Frances, CNM    Family History Family History  Problem Relation Age of Onset  . Cancer Mother        cervical  . Alcohol abuse Mother   . Other Father  fatty liver  . Cancer Maternal Grandmother        skin  . Dementia Maternal Grandmother   . Dementia Paternal Grandmother   . Diabetes Paternal Grandfather   . Dementia Paternal Grandfather   . Jaundice Daughter     Social History Social History   Tobacco Use  . Smoking status: Never Smoker  . Smokeless tobacco: Never Used  Substance Use Topics  . Alcohol use: No    Comment: occ  . Drug use: No     Allergies   Mineral oil   Review of Systems Review of Systems  Constitutional: Positive for fever.  HENT: Positive for congestion and sore throat.   Eyes: Negative.   Respiratory: Negative for chest tightness and shortness of breath.   Cardiovascular: Negative for chest pain.  Gastrointestinal: Negative for abdominal pain, diarrhea,  nausea and vomiting.  Genitourinary: Positive for frequency. Negative for dysuria, flank pain, hematuria, pelvic pain, urgency and vaginal discharge.  Musculoskeletal: Positive for back pain. Negative for arthralgias, joint swelling and neck pain.  Skin: Negative.  Negative for rash and wound.  Neurological: Negative for dizziness, weakness, light-headedness, numbness and headaches.  Psychiatric/Behavioral: Negative.      Physical Exam Updated Vital Signs BP 118/70 (BP Location: Right Arm)   Pulse 100   Temp 99.5 F (37.5 C) (Oral)   Resp 16   LMP 07/12/2017 Comment: changed birth control pills  SpO2 100%   Physical Exam  Constitutional: She appears well-developed and well-nourished.  HENT:  Head: Normocephalic and atraumatic.  Right Ear: Tympanic membrane and ear canal normal.  Left Ear: Tympanic membrane and ear canal normal.  Mouth/Throat: Uvula is midline. Oropharyngeal exudate, posterior oropharyngeal edema and posterior oropharyngeal erythema present. No tonsillar abscesses.  Eyes: Conjunctivae are normal.  Neck: Normal range of motion.  Cardiovascular: Regular rhythm, normal heart sounds and intact distal pulses. Tachycardia present.  Pulmonary/Chest: Effort normal and breath sounds normal. She has no decreased breath sounds. She has no wheezes. She has no rhonchi. She has no rales.  Abdominal: Soft. Bowel sounds are normal. She exhibits no mass. There is no tenderness. There is no guarding.  No cva tenderness  Musculoskeletal: Normal range of motion.  Neurological: She is alert.  Skin: Skin is warm and dry.  Psychiatric: She has a normal mood and affect.  Nursing note and vitals reviewed.    ED Treatments / Results  Labs (all labs ordered are listed, but only abnormal results are displayed) Labs Reviewed  URINALYSIS, ROUTINE W REFLEX MICROSCOPIC - Abnormal; Notable for the following components:      Result Value   APPearance HAZY (*)    All other components  within normal limits  GROUP A STREP BY PCR  POC URINE PREG, ED    EKG None  Radiology Dg Chest 2 View  Result Date: 09/05/2017 CLINICAL DATA:  Sore throat yesterday. Generalized aching and chills today. EXAM: CHEST - 2 VIEW COMPARISON:  03/12/2014 FINDINGS: Heart size is normal. Mediastinal shadows are normal. The lungs are clear. No bronchial thickening. No infiltrate, mass, effusion or collapse. Pulmonary vascularity is normal. No bony abnormality. IMPRESSION: Normal chest Electronically Signed   By: Paulina Fusi M.D.   On: 09/05/2017 13:18    Procedures Procedures (including critical care time)  Medications Ordered in ED Medications  acetaminophen (TYLENOL) tablet 650 mg (650 mg Oral Given 09/05/17 1059)  amoxicillin (AMOXIL) capsule 500 mg (500 mg Oral Given 09/05/17 1341)     Initial Impression /  Assessment and Plan / ED Course  I have reviewed the triage vital signs and the nursing notes.  Pertinent labs & imaging results that were available during my care of the patient were reviewed by me and considered in my medical decision making (see chart for details).     Pt with acute tonsillitis which will be covered with amoxil.  cxr negative for pulmonary source of infection. Urine clear with no cva tenderness, pyelonephritis unlikely.  Home tx discussed,apap, ibu prn fever, body aches. Rest, fluids.  Recheck pcp or recheck for worsened or persistent sx.   Final Clinical Impressions(s) / ED Diagnoses   Final diagnoses:  Tonsillitis    ED Discharge Orders         Ordered    amoxicillin (AMOXIL) 500 MG capsule  3 times daily     09/05/17 1344           Victoriano Laindol, Wayburn Shaler, PA-C 09/05/17 2156    Mesner, Barbara CowerJason, MD 09/06/17 [phone]

## 2017-09-05 NOTE — ED Triage Notes (Signed)
Pt c/o sore throat yesterday. Woke with body aches and nausea and chills. Denies v/d. Nad.

## 2017-12-05 ENCOUNTER — Other Ambulatory Visit: Payer: Self-pay

## 2017-12-05 ENCOUNTER — Emergency Department (HOSPITAL_COMMUNITY)
Admission: EM | Admit: 2017-12-05 | Discharge: 2017-12-05 | Disposition: A | Discharge status: Home / Self Care | Payer: 59 | Attending: Emergency Medicine | Admitting: Emergency Medicine

## 2017-12-05 ENCOUNTER — Encounter (HOSPITAL_COMMUNITY): Payer: Self-pay

## 2017-12-05 DIAGNOSIS — R070 Pain in throat: Secondary | ICD-10-CM | POA: Diagnosis not present

## 2017-12-05 DIAGNOSIS — Z79899 Other long term (current) drug therapy: Secondary | ICD-10-CM | POA: Diagnosis not present

## 2017-12-05 DIAGNOSIS — J029 Acute pharyngitis, unspecified: Secondary | ICD-10-CM

## 2017-12-05 LAB — URINALYSIS, ROUTINE W REFLEX MICROSCOPIC
BILIRUBIN URINE: NEGATIVE
Glucose, UA: NEGATIVE mg/dL
Ketones, ur: NEGATIVE mg/dL
NITRITE: NEGATIVE
PROTEIN: NEGATIVE mg/dL
Specific Gravity, Urine: 1.005 (ref 1.005–1.030)
pH: 6 (ref 5.0–8.0)

## 2017-12-05 LAB — CBG MONITORING, ED: GLUCOSE-CAPILLARY: 84 mg/dL (ref 70–99)

## 2017-12-05 LAB — PREGNANCY, URINE: Preg Test, Ur: NEGATIVE

## 2017-12-05 LAB — GROUP A STREP BY PCR: Group A Strep by PCR: NOT DETECTED

## 2017-12-05 NOTE — ED Notes (Signed)
Pt is also complaining of urinary symptoms. States she feels like she has to pee all the time, but not a lot comes out. States her lower back is also hurting

## 2017-12-05 NOTE — ED Provider Notes (Signed)
[facility] EMERGENCY DEPARTMENT Provider Note   CSN: [id number] Arrival date & time: 12/05/17  0845     History   Chief Complaint Chief Complaint  Patient presents with  . Sore Throat    HPI [name] Brown is a 27 y.o. female.  HPI  Pt was seen at 0900.  Per pt, c/o gradual onset and persistence of constant sore throat since yesterday. States she has been exposed to strep by an extended family member and is concerned regarding same.  Pt also c/o urinary frequency and urgency for the past 2 weeks, denies dysuria/hematuria, no pelvic pain. Denies cough, no fevers, no rash, no CP/SOB, no N/V/D, no abd pain, no back pain, no vaginal bleeding/discharge.    Past Medical History:  Diagnosis Date  . Chronic abdominal pain   . Colitis   . Counseling for birth control, oral contraceptives 06/26/2014  . Medical history non-contributory   . Missed period 06/26/2014    Patient Active Problem List   Diagnosis Date Noted  . Nexplanon in place 06/27/2017  . 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  . Screening examination for STD (sexually transmitted disease) 06/27/2017  . Vaginal discharge 06/27/2017  . Nexplanon insertion 06/19/2017  . 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/2017  . Status post primary low transverse cesarean section 04/14/2017  . Anemia in pregnancy 04/13/2017  . BV (bacterial vaginosis) 09/11/2016  . Abdominal pain 04/22/2014    Past Surgical History:  Procedure Laterality Date  . CESAREAN SECTION N/A 04/13/2017   Procedure: CESAREAN SECTION;  Surgeon:  Bing, MD;  Location: [facility] SU[facility];  Service: Obstetrics;  Laterality: N/A;  . NO PAST SURGERIES       OB History    Gravida  1   Para  1   Term  1   Preterm  0   AB  0   Living  1     SAB  0   TAB  0   Ectopic  0   Multiple  0   Live Births  1            Home Medications    Prior to Admission medications   Medication Sig Start Date End Date Taking? Authorizing Provider    escitalopram (LEXAPRO) 20 MG tablet Take 1 tablet (20 mg total) by mouth daily. 06/27/17   Adline Potter, NP  etonogestrel (NEXPLANON) 68 MG IMPL implant 1 each by Subdermal route once.    [provider]  flintstones complete (FLINTSTONES) 60 MG chewable tablet Chew 2 tablets by mouth daily.    [provider]  metroNIDAZOLE (FLAGYL) 500 MG tablet Take 1 tablet (500 mg total) by mouth 2 (two) times daily. Patient not taking: Reported on 08/21/2017 06/27/17   Adline Potter, NP  Norethindrone-Ethinyl Estradiol-Fe Biphas (LO LOESTRIN FE) 1 MG-10 MCG / 10 MCG tablet Take 1 tablet by mouth daily. 08/21/17   Jacklyn Shell, CNM    Family History Family History  Problem Relation Age of Onset  . Cancer Mother        cervical  . Alcohol abuse Mother   . Other Father        fatty liver  . Cancer Maternal Grandmother        skin  . Dementia Maternal Grandmother   . Dementia Paternal Grandmother   . Diabetes Paternal Grandfather   . Dementia Paternal Grandfather   . Jaundice Daughter     Social History Social History   Tobacco Use  . Smoking status: Never  Smoker  . Smokeless tobacco: Never Used  Substance Use Topics  . Alcohol use: No    Comment: occ  . Drug use: No     Allergies   Mineral oil   Review of Systems Review of Systems ROS: Statement: All systems negative except as marked or noted in the HPI; Constitutional: Negative for fever and chills. ; ; Eyes: Negative for eye pain, redness and discharge. ; ; ENMT: Negative for ear pain, hoarseness, nasal congestion, sinus pressure and +sore throat. ; ; Cardiovascular: Negative for chest pain, palpitations, diaphoresis, dyspnea and peripheral edema. ; ; Respiratory: Negative for cough, wheezing and stridor. ; ; Gastrointestinal: Negative for nausea, vomiting, diarrhea, abdominal pain, blood in stool, hematemesis, jaundice and rectal bleeding. . ; ; Genitourinary: +urinary frequency and urgency.  Negative for dysuria, flank pain and hematuria. ; ; GYN:  No pelvic pain, no vaginal bleeding, no vaginal discharge, no vulvar pain. ;; Musculoskeletal: Negative for back pain and neck pain. Negative for swelling and trauma.; ; Skin: Negative for pruritus, rash, abrasions, blisters, bruising and skin lesion.; ; Neuro: Negative for headache, lightheadedness and neck stiffness. Negative for weakness, altered level of consciousness, altered mental status, extremity weakness, paresthesias, involuntary movement, seizure and syncope.       Physical Exam Updated Vital Signs BP 119/87 (BP Location: Left Arm)   Pulse (!) 116   Temp 98.6 F (37 C) (Oral)   Resp 14   Ht 5\' 4"  (1.626 m)   Wt 72.6 kg   LMP 11/25/2017   SpO2 98%   BMI 27.46 kg/m    BP 109/75   Pulse 97   Temp 99.2 F (37.3 C) (Oral)   Resp 12   Ht 5\' 4"  (1.626 m)   Wt 72.6 kg   LMP 11/25/2017   SpO2 97%   BMI 27.46 kg/m    Physical Exam 0905: Physical examination:  Nursing notes reviewed; Vital signs and O2 SAT reviewed;  Constitutional: Well developed, Well nourished, Well hydrated, In no acute distress; Head:  Normocephalic, atraumatic; Eyes: EOMI, PERRL, No scleral icterus; ENMT: Mouth and pharynx normal, Mucous membranes moist; Neck: Supple, Full range of motion, No lymphadenopathy; Cardiovascular: Regular rate and rhythm, No gallop; Respiratory: Breath sounds clear & equal bilaterally, No wheezes.  Speaking full sentences with ease, Normal respiratory effort/excursion; Chest: Nontender, Movement normal; Abdomen: Soft, Nontender, Nondistended, Normal bowel sounds; Genitourinary: No CVA tenderness; Extremities: Peripheral pulses normal, No tenderness, No edema, No calf edema or asymmetry.; Neuro: AA&Ox3, Major CN grossly intact.  Speech clear. No gross focal motor or sensory deficits in extremities. Climbs on and off stretcher easily by herself. Gait steady..; Skin: Color normal, Warm, Dry.   ED Treatments / Results   Labs (all labs ordered are listed, but only abnormal results are displayed)   EKG None  Radiology   Procedures Procedures (including critical care time)  Medications Ordered in ED Medications - No data to display   Initial Impression / Assessment and Plan / ED Course  I have reviewed the triage vital signs and the nursing notes.  Pertinent labs & imaging results that were available during my care of the patient were reviewed by me and considered in my medical decision making (see chart for details).   MDM Reviewed: previous chart, nursing note and vitals Interpretation: labs    Results for orders placed or performed during the hospital encounter of 12/05/17  Group A Strep by PCR  Result Value Ref Range   Group A Strep  by PCR NOT DETECTED NOT DETECTED  Pregnancy, urine  Result Value Ref Range   Preg Test, Ur NEGATIVE NEGATIVE  Urinalysis, Routine w reflex microscopic  Result Value Ref Range   Color, Urine YELLOW YELLOW   APPearance CLEAR CLEAR   Specific Gravity, Urine 1.005 1.005 - 1.030   pH 6.0 5.0 - 8.0   Glucose, UA NEGATIVE NEGATIVE mg/dL   Hgb urine dipstick SMALL (A) NEGATIVE   Bilirubin Urine NEGATIVE NEGATIVE   Ketones, ur NEGATIVE NEGATIVE mg/dL   Protein, ur NEGATIVE NEGATIVE mg/dL   Nitrite NEGATIVE NEGATIVE   Leukocytes, UA TRACE (A) NEGATIVE   RBC / HPF 0-5 0 - 5 RBC/hpf   WBC, UA 0-5 0 - 5 WBC/hpf   Bacteria, UA RARE (A) NONE SEEN   Squamous Epithelial / LPF 6-10 0 - 5  CBG monitoring, ED  Result Value Ref Range   Glucose-Capillary 84 70 - 99 mg/dL     [id number]:  Workup reassuring. Pt denies dysuria, pelvic pain, vaginal discharge. Exam remains benign. Tx sore throat symptomatically at this time. Dx and testing d/w pt.  Questions answered.  Verb understanding, agreeable to d/c home with outpt f/u.    Final Clinical Impressions(s) / ED Diagnoses   Final diagnoses:  None    ED Discharge Orders    None       Samuel JesterMcManus, Draedyn Weidinger,  DO 12/10/17 1609

## 2017-12-05 NOTE — ED Triage Notes (Signed)
Pt has been having a sore throat since last night. Has swollen tonsils and exudate on tongue. States she has had 2 family members with strep

## 2017-12-05 NOTE — Discharge Instructions (Signed)
Take over the counter tylenol and ibuprofen, as directed on packaging, as needed for discomfort.  Gargle with warm water several times per day to help with discomfort.  May also use over the counter sore throat pain medicines such as chloraseptic or sucrets, as directed on packaging, as needed for discomfort.  Call your regular medical doctor today to schedule a follow up appointment this week.  Return to the Emergency Department immediately if worsening. ° °

## 2018-03-25 NOTE — Telephone Encounter (Signed)
Note sent to nurse. 

## 2018-09-05 ENCOUNTER — Telehealth: Payer: Self-pay | Admitting: Advanced Practice Midwife

## 2018-09-05 NOTE — Telephone Encounter (Signed)
Pt states that she is having ovarian pain and a discharge with odor. Please advise.

## 2018-09-17 ENCOUNTER — Encounter: Payer: Self-pay | Admitting: Obstetrics and Gynecology

## 2018-09-17 ENCOUNTER — Ambulatory Visit: Payer: Self-pay | Admitting: Obstetrics and Gynecology

## 2018-09-17 ENCOUNTER — Other Ambulatory Visit: Payer: Self-pay

## 2018-09-17 VITALS — BP 109/68 | HR 84 | Ht 64.0 in | Wt 150.2 lb

## 2018-09-17 DIAGNOSIS — B9689 Other specified bacterial agents as the cause of diseases classified elsewhere: Secondary | ICD-10-CM

## 2018-09-17 DIAGNOSIS — N760 Acute vaginitis: Secondary | ICD-10-CM

## 2018-09-17 DIAGNOSIS — N898 Other specified noninflammatory disorders of vagina: Secondary | ICD-10-CM

## 2018-09-17 MED ORDER — METRONIDAZOLE 0.75 % VA GEL: 1.0000 | Freq: Every day | VAGINAL | 1 refills | Status: DC

## 2018-09-17 NOTE — Progress Notes (Signed)
Patient ID: [name] Brown, female   DOB: 11/28/1990, 28 y.o.   MRN: [id number]    [place] [facility] Visit  @DATE @            Patient name: [name] Brown MRN [id number]  Date of birth: 10-Aug-1990  CC & HPI:  [name] Brown is a 28 y.o. female presenting today for vaginal discharge and pain in ovaries. Discharge at one point was yellow and is now white, but has some burning with urination and notices odor. Notices more of an odor after her period. No longer taking OCP but is using condoms. Often forgot to take pill everyday. Notices pain on right side near c-section scar but deep. Sleeps with ex-boyfriend form time to time.  ROS:  ROS +vaginal discharge -fever  All systems are negative except as noted in the HPI and PMH.   Pertinent History Reviewed:   Reviewed:  Medical         Past Medical History:  Diagnosis Date  . Chronic abdominal pain   . Colitis   . Counseling for birth control, oral contraceptives 06/26/2014  . Medical history non-contributory   . Missed period 06/26/2014                              Surgical Hx:    Past Surgical History:  Procedure Laterality Date  . CESAREAN SECTION N/A 04/13/2017   Procedure: CESAREAN SECTION;  Surgeon: Aletha Halim, MD;  Location: [place];  Service: Obstetrics;  Laterality: N/A;  . NO PAST SURGERIES     Medications: Reviewed & Updated - see associated section                       Current Outpatient Medications:  .  escitalopram (LEXAPRO) 20 MG tablet, Take 1 tablet (20 mg total) by mouth daily. (Patient not taking: Reported on 09/17/2018), Disp: 30 tablet, Rfl: 6 .  flintstones complete (FLINTSTONES) 60 MG chewable tablet, Chew 2 tablets by mouth daily., Disp: , Rfl:  .  Norethindrone-Ethinyl Estradiol-Fe Biphas (LO LOESTRIN FE) 1 MG-10 MCG / 10 MCG tablet, Take 1 tablet by mouth daily. (Patient not taking: Reported on 09/17/2018), Disp: 1 Package, Rfl: 11   Social History: Reviewed -  reports that she has never smoked.  She has never used smokeless tobacco.  Objective Findings:  Vitals: Blood pressure 109/68, pulse 84, height 5\' 4"  (1.626 m), weight 150 lb 3.2 oz (68.1 kg), last menstrual period 09/03/2018, not currently breastfeeding.  PHYSICAL EXAMINATION General appearance - alert, well appearing, and in no distress Mental status - alert, oriented to person, place, and time, normal mood, behavior, speech, dress, motor activity, and thought processes, affect appropriate to mood  PELVIC External genitalia - skin tags, non-worrisome Vagina - moderate amount of yellow discharge Cervix - not source of pain Uterus - non tender Wet prep- pos clue, neg trich or yeast GCCHL collected  Assessment & Plan:   A:  1.  BV  2. STI screen  P:  1. Metrogel 2. PRN    By signing my name below, I, Samul Dada, attest that this documentation has been prepared under the direction and in the presence of Jonnie Kind, MD. Electronically Signed: [place]. 09/17/18. 4:06 PM.  I personally performed the services described in this documentation, which was SCRIBED in my presence. The recorded information has been reviewed and considered accurate. It  has been edited as necessary during review. Jonnie Kind, MD

## 2018-09-17 NOTE — Addendum Note (Signed)
Addended by: Octaviano Glow on: 09/17/2018 05:10 PM   Modules accepted: Orders

## 2018-09-22 LAB — GC/CHLAMYDIA PROBE AMP
Chlamydia trachomatis, NAA: NEGATIVE
Neisseria Gonorrhoeae by PCR: NEGATIVE

## 2019-01-29 ENCOUNTER — Ambulatory Visit: Payer: Medicaid Other | Admitting: Adult Health

## 2019-03-30 ENCOUNTER — Ambulatory Visit
Admission: EM | Admit: 2019-03-30 | Discharge: 2019-03-30 | Disposition: A | Discharge status: Home / Self Care | Payer: Medicaid Other | Attending: Emergency Medicine | Admitting: Emergency Medicine

## 2019-03-30 ENCOUNTER — Telehealth: Payer: Self-pay

## 2019-03-30 ENCOUNTER — Other Ambulatory Visit: Payer: Self-pay

## 2019-03-30 DIAGNOSIS — Z20822 Contact with and (suspected) exposure to covid-19: Secondary | ICD-10-CM

## 2019-03-30 DIAGNOSIS — R1013 Epigastric pain: Secondary | ICD-10-CM

## 2019-03-30 DIAGNOSIS — R6889 Other general symptoms and signs: Secondary | ICD-10-CM | POA: Insufficient documentation

## 2019-03-30 LAB — POCT URINALYSIS DIP (MANUAL ENTRY)
Bilirubin, UA: NEGATIVE
Blood, UA: NEGATIVE
Glucose, UA: NEGATIVE mg/dL
Leukocytes, UA: NEGATIVE
Nitrite, UA: NEGATIVE
Protein Ur, POC: NEGATIVE mg/dL
Spec Grav, UA: 1.02 (ref 1.010–1.025)
Urobilinogen, UA: 0.2 E.U./dL
pH, UA: 7 (ref 5.0–8.0)

## 2019-03-30 LAB — POCT URINE PREGNANCY: Preg Test, Ur: NEGATIVE

## 2019-03-30 LAB — POCT INFLUENZA A/B
Influenza A, POC: NEGATIVE
Influenza B, POC: NEGATIVE

## 2019-03-30 LAB — POCT RAPID STREP A (OFFICE): Rapid Strep A Screen: NEGATIVE

## 2019-03-30 LAB — POC SARS CORONAVIRUS 2 AG -  ED: SARS Coronavirus 2 Ag: NEGATIVE

## 2019-03-30 MED ORDER — ONDANSETRON HCL 4 MG PO TABS: 4.0000 mg | ORAL_TABLET | Freq: Four times a day (QID) | ORAL | 0 refills | Status: DC

## 2019-03-30 MED ORDER — ALUM & MAG HYDROXIDE-SIMETH 200-200-20 MG/5ML PO SUSP
30.0000 mL | Freq: Once | ORAL | Status: AC
  Administered 2019-03-30: 30 mL via ORAL

## 2019-03-30 MED ORDER — CETIRIZINE HCL 10 MG PO TABS: 10.0000 mg | ORAL_TABLET | Freq: Every day | ORAL | 0 refills | Status: DC

## 2019-03-30 MED ORDER — FLUTICASONE PROPIONATE 50 MCG/ACT NA SUSP: 2.0000 | Freq: Every day | NASAL | 0 refills | Status: DC

## 2019-03-30 NOTE — Discharge Instructions (Signed)
Flu negative Strep negative.  Culture sent Urine without infection.  Possible mild dehydration Urine pregnancy negative Rapid COVID negative.  Culture sent.  Patient should remain in quarantine until they have received culture results.  If negative you may resume normal activities (go back to work/school) while practicing hand hygiene, social distance, and mask wearing.  If positive, patient should remain in quarantine for 10 days from symptom onset AND greater than 72 hours after symptoms resolution (absence of fever without the use of fever-reducing medication and improvement in respiratory symptoms), whichever is longer Get plenty of rest and push fluids Zyrtec for nasal congestion, runny nose, and/or sore throat Flonase for nasal congestion and runny nose Zofran for nausea or vomiting Use medications daily for symptom relief Use OTC medications like ibuprofen or tylenol as needed fever or pain Call or go to the ED if you have any new or worsening symptoms such as fever, worsening cough, shortness of breath, chest tightness, chest pain, turning blue, changes in mental status, worsening abdominal pain, no improvement with medications, etc..Marland Kitchen

## 2019-03-30 NOTE — ED Triage Notes (Signed)
Pt having body aches and stomach cramping. Had one diarrhea episode and some nausea, pt also states tonsils are swollen and painfull

## 2019-03-30 NOTE — ED Provider Notes (Signed)
[place]   [id number] 03/30/19 Arrival Time: 1440   CC: COVID symptoms  SUBJECTIVE: History from: patient.  [name] Brown is a 29 y.o. female who presents with sore throat, body aches, nausea, and 1 episode of diarrhea that began 1-3 days ago.  Denies sick exposure to COVID, flu or strep.  Denies recent travel.  Has tried OTC sudafed without relief.  Reports previous symptoms in the past with tonsilitis.     Also mentions intermittent epigastric discomfort as well that began 1-3 days ago.  29 year old jumped on stomach prior to symptoms.  Describes as burning and stabbing.  Worse with leaning forward.  Denies similar symptoms in the past.    Denies fever, chills, cough, SOB, wheezing, chest pain, vomiting, changes in bladder habits.     ROS: As per HPI.  All other pertinent ROS negative.     Past Medical History:  Diagnosis Date  . Chronic abdominal pain   . Colitis   . Counseling for birth control, oral contraceptives 06/26/2014  . Medical history non-contributory   . Missed period 06/26/2014   Past Surgical History:  Procedure Laterality Date  . CESAREAN SECTION N/A 04/13/2017   Procedure: CESAREAN SECTION;  Surgeon: Aletha Halim, MD;  Location: [place];  Service: Obstetrics;  Laterality: N/A;  . NO PAST SURGERIES     Allergies  Allergen Reactions  . Mineral Oil Rash    Baby oil   No current facility-administered medications on file prior to encounter.   Current Outpatient Medications on File Prior to Encounter  Medication Sig Dispense Refill  . flintstones complete (FLINTSTONES) 60 MG chewable tablet Chew 2 tablets by mouth daily.    . [DISCONTINUED] escitalopram (LEXAPRO) 20 MG tablet Take 1 tablet (20 mg total) by mouth daily. (Patient not taking: Reported on 09/17/2018) 30 tablet 6  . [DISCONTINUED] Norethindrone-Ethinyl Estradiol-Fe Biphas (LO LOESTRIN FE) 1 MG-10 MCG / 10 MCG tablet Take 1 tablet by mouth daily. (Patient not taking: Reported  on 09/17/2018) 1 Package 11   Social History   Socioeconomic History  . Marital status: Single    Spouse name: Not on file  . Number of children: Not on file  . Years of education: Not on file  . Highest education level: Not on file  Occupational History  . Not on file  Tobacco Use  . Smoking status: Never Smoker  . Smokeless tobacco: Never Used  Substance and Sexual Activity  . Alcohol use: No    Comment: occ  . Drug use: No  . Sexual activity: Yes    Birth control/protection: None  Other Topics Concern  . Not on file  Social History Narrative  . Not on file   Social Determinants of Health   Financial Resource Strain:   . Difficulty of Paying Living Expenses: Not on file  Food Insecurity:   . Worried About Charity fundraiser in the Last Year: Not on file  . Ran Out of Food in the Last Year: Not on file  Transportation Needs:   . Lack of Transportation (Medical): Not on file  . Lack of Transportation (Non-Medical): Not on file  Physical Activity:   . Days of Exercise per Week: Not on file  . Minutes of Exercise per Session: Not on file  Stress:   . Feeling of Stress : Not on file  Social Connections:   . Frequency of Communication with Friends and Family: Not on file  . Frequency of Social  Gatherings with Friends and Family: Not on file  . Attends Religious Services: Not on file  . Active Member of Clubs or Organizations: Not on file  . Attends Archivist Meetings: Not on file  . Marital Status: Not on file  Intimate Partner Violence:   . Fear of Current or Ex-Partner: Not on file  . Emotionally Abused: Not on file  . Physically Abused: Not on file  . Sexually Abused: Not on file   Family History  Problem Relation Age of Onset  . Cancer Mother        cervical  . Alcohol abuse Mother   . Other Father        fatty liver  . Cancer Maternal Grandmother        skin  . Dementia Maternal Grandmother   . Dementia Paternal Grandmother   . Diabetes  Paternal Grandfather   . Dementia Paternal Grandfather   . Jaundice Daughter     OBJECTIVE:  Vitals:   03/30/19 1454  BP: 117/80  Pulse: (!) 106  Resp: 20  Temp: 98.4 F (36.9 C)  SpO2: 98%     General appearance: alert; appears fatigued, but nontoxic; speaking in full sentences and tolerating own secretions HEENT: NCAT; Ears: EACs clear, TMs pearly gray; Eyes: PERRL.  EOM grossly intact. Nose: nares patent without rhinorrhea, Throat: oropharynx clear, tonsils non erythematous or enlarged, uvula midline  Neck: supple without LAD Lungs: unlabored respirations, symmetrical air entry; cough: absent; no respiratory distress; CTAB Heart: regular rate and rhythm.   Abdomen: soft, nondistended, normal active bowel sounds; diffusely TTP over epigastric region; no guarding; negative murphy's; NTTP at McBurney's point Skin: warm and dry Psychological: alert and cooperative; normal mood and affect  LABS:  Results for orders placed or performed during the hospital encounter of 03/30/19 (from the past 24 hour(s))  POCT urinalysis dipstick     Status: Abnormal   Collection Time: 03/30/19  3:02 PM  Result Value Ref Range   Color, UA yellow yellow   Clarity, UA clear clear   Glucose, UA negative negative mg/dL   Bilirubin, UA negative negative   Ketones, POC UA trace (5) (A) negative mg/dL   Spec Grav, UA 1.020 1.010 - 1.025   Blood, UA negative negative   pH, UA 7.0 5.0 - 8.0   Protein Ur, POC negative negative mg/dL   Urobilinogen, UA 0.2 0.2 or 1.0 E.U./dL   Nitrite, UA Negative Negative   Leukocytes, UA Negative Negative  POCT urine pregnancy     Status: None   Collection Time: 03/30/19  3:02 PM  Result Value Ref Range   Preg Test, Ur Negative Negative  POCT rapid strep A     Status: None   Collection Time: 03/30/19  3:03 PM  Result Value Ref Range   Rapid Strep A Screen Negative Negative  POCT Influenza A/B     Status: None   Collection Time: 03/30/19  3:11 PM  Result Value  Ref Range   Influenza A, POC Negative Negative   Influenza B, POC Negative Negative  POC SARS Coronavirus 2 Ag-ED - Nasal Swab (BD Veritor Kit)     Status: None   Collection Time: 03/30/19  3:36 PM  Result Value Ref Range   SARS Coronavirus 2 Ag Negative Negative     ASSESSMENT & PLAN:  1. Suspected COVID-19 virus infection   2. Flu-like symptoms   3. Epigastric discomfort     Meds ordered this encounter  Medications  .  alum & mag hydroxide-simeth (MAALOX/MYLANTA) 200-200-20 MG/5ML suspension 30 mL  . cetirizine (ZYRTEC) 10 MG tablet    Sig: Take 1 tablet (10 mg total) by mouth daily.    Dispense:  30 tablet    Refill:  0    Order Specific Question:   Supervising Provider    Answer:   Raylene Everts [[id number]]  . fluticasone (FLONASE) 50 MCG/ACT nasal spray    Sig: Place 2 sprays into both nostrils daily.    Dispense:  16 g    Refill:  0    Order Specific Question:   Supervising Provider    Answer:   Raylene Everts [[id number]]  . ondansetron (ZOFRAN) 4 MG tablet    Sig: Take 1 tablet (4 mg total) by mouth every 6 (six) hours.    Dispense:  12 tablet    Refill:  0    Order Specific Question:   Supervising Provider    Answer:   Raylene Everts [[id number]]    Flu negative Strep negative.  Culture sent Urine without infection.  Possible mild dehydration Urine pregnancy negative Rapid COVID negative.  Culture sent.  Patient should remain in quarantine until they have received culture results.  If negative you may resume normal activities (go back to work/school) while practicing hand hygiene, social distance, and mask wearing.  If positive, patient should remain in quarantine for 10 days from symptom onset AND greater than 72 hours after symptoms resolution (absence of fever without the use of fever-reducing medication and improvement in respiratory symptoms), whichever is longer Get plenty of rest and push fluids Zyrtec for nasal congestion, runny nose, and/or sore  throat Flonase for nasal congestion and runny nose Zofran for nausea or vomiting Use medications daily for symptom relief Use OTC medications like ibuprofen or tylenol as needed fever or pain Call or go to the ED if you have any new or worsening symptoms such as fever, worsening cough, shortness of breath, chest tightness, chest pain, turning blue, changes in mental status, worsening abdominal pain, no improvement with medications, etc...    Reviewed expectations re: course of current medical issues. Questions answered. Outlined signs and symptoms indicating need for more acute intervention. Patient verbalized understanding. After Visit Summary given.         Lestine Box, PA-C 03/30/19 1540

## 2019-03-31 LAB — NOVEL CORONAVIRUS, NAA: SARS-CoV-2, NAA: NOT DETECTED

## 2019-05-04 ENCOUNTER — Other Ambulatory Visit: Payer: Self-pay

## 2019-05-04 ENCOUNTER — Encounter (HOSPITAL_COMMUNITY): Payer: Self-pay | Admitting: *Deleted

## 2019-05-04 ENCOUNTER — Emergency Department (HOSPITAL_COMMUNITY)
Admission: EM | Admit: 2019-05-04 | Discharge: 2019-05-04 | Disposition: A | Discharge status: Home / Self Care | Payer: Self-pay | Attending: Emergency Medicine | Admitting: Emergency Medicine

## 2019-05-04 ENCOUNTER — Emergency Department (HOSPITAL_COMMUNITY): Payer: Self-pay

## 2019-05-04 DIAGNOSIS — Z79899 Other long term (current) drug therapy: Secondary | ICD-10-CM | POA: Insufficient documentation

## 2019-05-04 DIAGNOSIS — N12 Tubulo-interstitial nephritis, not specified as acute or chronic: Secondary | ICD-10-CM | POA: Insufficient documentation

## 2019-05-04 LAB — CBC WITH DIFFERENTIAL/PLATELET
Abs Immature Granulocytes: 0.07 10*3/uL (ref 0.00–0.07)
Basophils Absolute: 0 10*3/uL (ref 0.0–0.1)
Basophils Relative: 0 %
Eosinophils Absolute: 0 10*3/uL (ref 0.0–0.5)
Eosinophils Relative: 0 %
HCT: 40 % (ref 36.0–46.0)
Hemoglobin: 13.4 g/dL (ref 12.0–15.0)
Immature Granulocytes: 1 %
Lymphocytes Relative: 4 %
Lymphs Abs: 0.5 10*3/uL — ABNORMAL LOW (ref 0.7–4.0)
MCH: 31.7 pg (ref 26.0–34.0)
MCHC: 33.5 g/dL (ref 30.0–36.0)
MCV: 94.6 fL (ref 80.0–100.0)
Monocytes Absolute: 0.7 10*3/uL (ref 0.1–1.0)
Monocytes Relative: 4 %
Neutro Abs: 13.8 10*3/uL — ABNORMAL HIGH (ref 1.7–7.7)
Neutrophils Relative %: 91 %
Platelets: 236 10*3/uL (ref 150–400)
RBC: 4.23 MIL/uL (ref 3.87–5.11)
RDW: 12.1 % (ref 11.5–15.5)
WBC: 15.2 10*3/uL — ABNORMAL HIGH (ref 4.0–10.5)
nRBC: 0 % (ref 0.0–0.2)

## 2019-05-04 LAB — URINALYSIS, ROUTINE W REFLEX MICROSCOPIC
Bilirubin Urine: NEGATIVE
Glucose, UA: NEGATIVE mg/dL
Ketones, ur: NEGATIVE mg/dL
Nitrite: NEGATIVE
Protein, ur: NEGATIVE mg/dL
Specific Gravity, Urine: 1.011 (ref 1.005–1.030)
pH: 7 (ref 5.0–8.0)

## 2019-05-04 LAB — COMPREHENSIVE METABOLIC PANEL
ALT: 21 U/L (ref 0–44)
AST: 21 U/L (ref 15–41)
Albumin: 3.9 g/dL (ref 3.5–5.0)
Alkaline Phosphatase: 62 U/L (ref 38–126)
Anion gap: 8 (ref 5–15)
BUN: 10 mg/dL (ref 6–20)
CO2: 23 mmol/L (ref 22–32)
Calcium: 9.1 mg/dL (ref 8.9–10.3)
Chloride: 105 mmol/L (ref 98–111)
Creatinine, Ser: 0.66 mg/dL (ref 0.44–1.00)
GFR calc Af Amer: >60 mL/min (ref >60)
GFR calc non Af Amer: >60 mL/min (ref >60)
Glucose, Bld: 104 mg/dL — ABNORMAL HIGH (ref 70–99)
Potassium: 3.9 mmol/L (ref 3.5–5.1)
Sodium: 136 mmol/L (ref 135–145)
Total Bilirubin: 1 mg/dL (ref 0.3–1.2)
Total Protein: 7.3 g/dL (ref 6.5–8.1)

## 2019-05-04 LAB — PROTIME-INR
INR: 1 (ref 0.8–1.2)
Prothrombin Time: 13 seconds (ref 11.4–15.2)

## 2019-05-04 LAB — LACTIC ACID, PLASMA: Lactic Acid, Venous: 1.7 mmol/L (ref 0.5–1.9)

## 2019-05-04 LAB — APTT: aPTT: 34 seconds (ref 24–36)

## 2019-05-04 LAB — PREGNANCY, URINE: Preg Test, Ur: NEGATIVE

## 2019-05-04 MED ORDER — SODIUM CHLORIDE 0.9 % IV SOLN
1.0000 g | Freq: Once | INTRAVENOUS | Status: AC
  Administered 2019-05-04: 1 g via INTRAVENOUS
  Filled 2019-05-04: qty 10

## 2019-05-04 MED ORDER — ONDANSETRON HCL 4 MG PO TABS: 4.0000 mg | ORAL_TABLET | Freq: Four times a day (QID) | ORAL | 0 refills | Status: DC

## 2019-05-04 MED ORDER — SODIUM CHLORIDE 0.9 % IV BOLUS
1000.0000 mL | Freq: Once | INTRAVENOUS | Status: AC
  Administered 2019-05-04: 1000 mL via INTRAVENOUS

## 2019-05-04 MED ORDER — ACETAMINOPHEN 500 MG PO TABS
1000.0000 mg | ORAL_TABLET | Freq: Once | ORAL | Status: AC
  Administered 2019-05-04: 1000 mg via ORAL
  Filled 2019-05-04: qty 2

## 2019-05-04 MED ORDER — CEPHALEXIN 500 MG PO CAPS: 500.0000 mg | ORAL_CAPSULE | Freq: Three times a day (TID) | ORAL | 0 refills | Status: DC

## 2019-05-04 NOTE — Discharge Instructions (Addendum)
Make sure you are drinking plenty of fluids.  Take your next dose of the antibiotic prescribed this evening.  You may use Tylenol or Motrin for fever reduction.  You will need close follow-up if you have persistent or worsening symptoms.  If you develop uncontrolled vomiting or continue to spike fevers or have any new or worsening symptoms such as weakness or new complaints return here for consideration of admission.  However, if you are able to maintain hydration, this infection should resolve with today's treatment plan.  I also recommend a repeat urinalysis once your antibiotics are completed to ensure this infection has resolved.  Call your primary MD for an office visit for this test.

## 2019-05-04 NOTE — ED Triage Notes (Signed)
Patient presents to the ED with right flank pain that began at 0400 this morning.  Patient has history of kidney infections.

## 2019-05-04 NOTE — Progress Notes (Signed)
Code Sepsis monitoring discontinued due to discharge.  

## 2019-05-04 NOTE — ED Provider Notes (Signed)
[facility] EMERGENCY DEPARTMENT Provider Note   CSN: [id number] Arrival date & time: 05/04/19  9622     History Chief Complaint  Patient presents with  . Flank Pain    right    [name] Brown is a 29 y.o. female with a history significant for chronic abdominal pain including colitis, reported history of pyelonephritis and frequent UTIs presenting with a several day history of increased urinary frequency along with bladder fullness sensation along with a more yellow than normal urine, woke today around 4 AM with increased pain in her right flank which radiates into her lower abdomen and has been associated with nausea and vomiting x2-3 since waking.  She denies diarrhea, hematuria, no history of kidney stones, no vaginal discharge.  She has a fever here of 102.4, she was unaware of fever prior to arrival.  She has had no medications prior to arrival.  The history is provided by the patient.       Past Medical History:  Diagnosis Date  . Chronic abdominal pain   . Colitis   . Counseling for birth control, oral contraceptives 06/26/2014  . Medical history non-contributory   . Missed period 06/26/2014    Patient Active Problem List   Diagnosis Date Noted  . Nexplanon in place 06/27/2017  . 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  . Screening examination for STD (sexually transmitted disease) 06/27/2017  . Vaginal discharge 06/27/2017  . Nexplanon insertion 06/19/2017  . 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/2017  . Status post primary low transverse cesarean section 04/14/2017  . Anemia in pregnancy 04/13/2017  . BV (bacterial vaginosis) 09/11/2016  . Abdominal pain 04/22/2014    Past Surgical History:  Procedure Laterality Date  . CESAREAN SECTION N/A 04/13/2017   Procedure: CESAREAN SECTION;  Surgeon: Aletha Halim, MD;  Location: [place];  Service: Obstetrics;  Laterality: N/A;  . NO PAST SURGERIES       OB History    Gravida  1   Para  1   Term  1   Preterm  0   AB  0   Living  1     SAB  0   TAB  0   Ectopic  0   Multiple  0   Live Births  1           Family History  Problem Relation Age of Onset  . Cancer Mother        cervical  . Alcohol abuse Mother   . Other Father        fatty liver  . Cancer Maternal Grandmother        skin  . Dementia Maternal Grandmother   . Dementia Paternal Grandmother   . Diabetes Paternal Grandfather   . Dementia Paternal Grandfather   . Jaundice Daughter     Social History   Tobacco Use  . Smoking status: Never Smoker  . Smokeless tobacco: Never Used  Substance Use Topics  . Alcohol use: No    Comment: occ  . Drug use: No    Home Medications Prior to Admission medications   Medication Sig Start Date End Date Taking? Authorizing Provider  cetirizine (ZYRTEC) 10 MG tablet Take 1 tablet (10 mg total) by mouth daily. 03/30/19  Yes Wurst, [place], PA-C  cephALEXin (KEFLEX) 500 MG capsule Take 1 capsule (500 mg total) by mouth 3 (three) times daily for 10 days. 05/04/19 05/14/19  Evalee Jefferson, PA-C  flintstones complete (FLINTSTONES) 60 MG chewable tablet Chew 2 tablets by mouth  daily.    [provider]  fluticasone (FLONASE) 50 MCG/ACT nasal spray Place 2 sprays into both nostrils daily. Patient not taking: Reported on 05/04/2019 03/30/19   Wurst, [place], PA-C  ondansetron (ZOFRAN) 4 MG tablet Take 1 tablet (4 mg total) by mouth every 6 (six) hours. 05/04/19   Burgess Amor, PA-C  escitalopram (LEXAPRO) 20 MG tablet Take 1 tablet (20 mg total) by mouth daily. Patient not taking: Reported on 09/17/2018 06/27/17 03/30/19  Adline Potter, NP  Norethindrone-Ethinyl Estradiol-Fe Biphas (LO LOESTRIN FE) 1 MG-10 MCG / 10 MCG tablet Take 1 tablet by mouth daily. Patient not taking: Reported on 09/17/2018 08/21/17 03/30/19  Cresenzo-Dishmon, Scarlette Calico, CNM    Allergies    Mineral oil  Review of Systems   Review of Systems  Constitutional: Positive for fatigue. Negative for chills and fever.    HENT: Negative for congestion.   Eyes: Negative.   Respiratory: Negative for chest tightness and shortness of breath.   Cardiovascular: Negative for chest pain.  Gastrointestinal: Negative for abdominal pain, nausea and vomiting.  Genitourinary: Positive for dysuria, flank pain, frequency and urgency. Negative for hematuria.  Musculoskeletal: Negative for arthralgias, joint swelling and neck pain.  Skin: Negative.  Negative for rash and wound.  Neurological: Negative for dizziness, weakness, light-headedness, numbness and headaches.  Psychiatric/Behavioral: Negative.     Physical Exam Updated Vital Signs BP 96/65   Pulse 84   Temp 99.5 F (37.5 C) (Oral)   Resp (!) 28   Ht 5\' 4"  (1.626 m)   Wt 72.6 kg   LMP 05/02/2019 Comment: neg preg  SpO2 100%   BMI 27.46 kg/m   Physical Exam Vitals and nursing note reviewed.  Constitutional:      Appearance: She is well-developed.  HENT:     Head: Normocephalic and atraumatic.  Eyes:     Conjunctiva/sclera: Conjunctivae normal.  Cardiovascular:     Rate and Rhythm: Normal rate and regular rhythm.     Heart sounds: Normal heart sounds.  Pulmonary:     Effort: Pulmonary effort is normal.     Breath sounds: Normal breath sounds. No wheezing.  Abdominal:     General: Abdomen is flat. Bowel sounds are normal.     Palpations: Abdomen is soft.     Tenderness: There is no abdominal tenderness. There is right CVA tenderness.  Musculoskeletal:        General: Normal range of motion.     Cervical back: Normal range of motion.  Skin:    General: Skin is warm and dry.  Neurological:     Mental Status: She is alert.     ED Results / Procedures / Treatments   Labs (all labs ordered are listed, but only abnormal results are displayed) Labs Reviewed  CBC WITH DIFFERENTIAL/PLATELET - Abnormal; Notable for the following components:      Result Value   WBC 15.2 (*)    Neutro Abs 13.8 (*)    Lymphs Abs 0.5 (*)    All other components  within normal limits  URINALYSIS, ROUTINE W REFLEX MICROSCOPIC - Abnormal; Notable for the following components:   Hgb urine dipstick SMALL (*)    Leukocytes,Ua SMALL (*)    Bacteria, UA RARE (*)    All other components within normal limits  COMPREHENSIVE METABOLIC PANEL - Abnormal; Notable for the following components:   Glucose, Bld 104 (*)    All other components within normal limits  CULTURE, BLOOD (ROUTINE X 2)  CULTURE, BLOOD (  ROUTINE X 2)  URINE CULTURE  PREGNANCY, URINE  LACTIC ACID, PLASMA  APTT  PROTIME-INR    EKG EKG Interpretation  Date/Time:  Sunday May 04 2019 10:56:29 EDT Ventricular Rate:  116 PR Interval:    QRS Duration: 85 QT Interval:  316 QTC Calculation: 439 R Axis:   90 Text Interpretation: Sinus tachycardia Borderline right axis deviation Borderline T abnormalities, anterior leads No previous ECGs available Confirmed by Vanetta Mulders [id number]) on 05/04/2019 12:02:57 PM   Radiology CT Renal Stone Study  Result Date: 05/04/2019 CLINICAL DATA:  Right flank pain EXAM: CT ABDOMEN AND PELVIS WITHOUT CONTRAST TECHNIQUE: Multidetector CT imaging of the abdomen and pelvis was performed following the standard protocol without IV contrast. COMPARISON:  2017 FINDINGS: Lower chest: No acute abnormality. Hepatobiliary: No focal liver abnormality is seen. No gallstones, gallbladder wall thickening, or biliary dilatation. Pancreas: Unremarkable. Spleen: Unremarkable. Adrenals/Urinary Tract: Mild right perinephric stranding. Mild prominence of the right ureter without obstructing stone. Left kidney is unremarkable. Bladder is unremarkable. Adrenals are normal Stomach/Bowel: Stomach is within normal limits. Bowel is normal in caliber. Normal appendix. Vascular/Lymphatic: No significant vascular findings on this noncontrast study. No enlarged lymph nodes identified. Reproductive: Uterus and bilateral adnexa are unremarkable. Other: No abdominal wall hernia.  No ascites.  Musculoskeletal: No significant or acute osseous abnormality. IMPRESSION: Mild prominence of the right ureter and right perinephric stranding without obstructing stone. Possible that the stone has recently passed. Pyelonephritis cannot be excluded on this noncontrast study. Electronically Signed   By: Guadlupe Spanish M.D.   On: 05/04/2019 13:31    Procedures Procedures (including critical care time)  Medications Ordered in ED Medications  acetaminophen (TYLENOL) tablet 1,000 mg (1,000 mg Oral Given 05/04/19 1119)  sodium chloride 0.9 % bolus 1,000 mL (0 mLs Intravenous Stopped 05/04/19 1308)  cefTRIAXone (ROCEPHIN) 1 g in sodium chloride 0.9 % 100 mL IVPB (0 g Intravenous Stopped 05/04/19 1227)  sodium chloride 0.9 % bolus 1,000 mL (0 mLs Intravenous Stopped 05/04/19 1415)  sodium chloride 0.9 % bolus 1,000 mL (0 mLs Intravenous Stopped 05/04/19 1545)    ED Course  I have reviewed the triage vital signs and the nursing notes.  Pertinent labs & imaging results that were available during my care of the patient were reviewed by me and considered in my medical decision making (see chart for details).    MDM Rules/Calculators/A&P                      Patient with history and exam most consistent for acute pyelonephritis.  A urine culture has been ordered.  She does not have a history of kidney stones and her CT scan today is negative for renal or ureteral stones.  She was given Tylenol and her fever promptly improved with this medication.  She was given IV fluids and also was able to tolerate p.o. fluid intake without return of nausea or vomiting.  She was given an IV dose of Rocephin.  Since she has been able to tolerate p.o. fluids, is reasonable for attempted outpatient treatment.  She was prescribed Keflex and Zofran.  She was given strict return precautions including worse pain, uncontrolled vomiting, fevers or weakness.  If she does well at home, recommended repeat UA with her PCP once antibiotics  are completed.  The patient appears reasonably screened and/or stabilized for discharge and I doubt any other medical condition or other [facility] requiring further screening, evaluation, or treatment in the ED at this  time prior to discharge.  Final Clinical Impression(s) / ED Diagnoses Final diagnoses:  Pyelonephritis    Rx / DC Orders ED Discharge Orders         Ordered    cephALEXin (KEFLEX) 500 MG capsule  3 times daily     05/04/19 1543    ondansetron (ZOFRAN) 4 MG tablet  Every 6 hours     05/04/19 1548           Burgess Amor, PA-C 05/04/19 1552    Vanetta Mulders, MD 05/07/19 1719

## 2019-05-05 ENCOUNTER — Other Ambulatory Visit: Payer: Self-pay

## 2019-05-05 ENCOUNTER — Telehealth (HOSPITAL_BASED_OUTPATIENT_CLINIC_OR_DEPARTMENT_OTHER): Payer: Self-pay | Admitting: *Deleted

## 2019-05-05 ENCOUNTER — Observation Stay (HOSPITAL_COMMUNITY)
Admission: EM | Admit: 2019-05-05 | Discharge: 2019-05-06 | Disposition: A | Discharge status: Home / Self Care | Payer: Medicaid Other | Attending: Internal Medicine | Admitting: Internal Medicine

## 2019-05-05 ENCOUNTER — Encounter (HOSPITAL_COMMUNITY): Payer: Self-pay

## 2019-05-05 DIAGNOSIS — Z20822 Contact with and (suspected) exposure to covid-19: Secondary | ICD-10-CM | POA: Insufficient documentation

## 2019-05-05 DIAGNOSIS — B9620 Unspecified Escherichia coli [E. coli] as the cause of diseases classified elsewhere: Secondary | ICD-10-CM

## 2019-05-05 DIAGNOSIS — Z79899 Other long term (current) drug therapy: Secondary | ICD-10-CM | POA: Insufficient documentation

## 2019-05-05 DIAGNOSIS — N12 Tubulo-interstitial nephritis, not specified as acute or chronic: Secondary | ICD-10-CM

## 2019-05-05 DIAGNOSIS — N390 Urinary tract infection, site not specified: Secondary | ICD-10-CM | POA: Insufficient documentation

## 2019-05-05 DIAGNOSIS — A4151 Sepsis due to Escherichia coli [E. coli]: Principal | ICD-10-CM | POA: Insufficient documentation

## 2019-05-05 DIAGNOSIS — R7881 Bacteremia: Secondary | ICD-10-CM | POA: Diagnosis present

## 2019-05-05 LAB — BLOOD CULTURE ID PANEL (REFLEXED)

## 2019-05-05 LAB — SARS CORONAVIRUS 2 (TAT 6-24 HRS): SARS Coronavirus 2: NEGATIVE

## 2019-05-05 LAB — CBC
HCT: 39.6 % (ref 36.0–46.0)
Hemoglobin: 12.9 g/dL (ref 12.0–15.0)
MCH: 31.4 pg (ref 26.0–34.0)
MCHC: 32.6 g/dL (ref 30.0–36.0)
MCV: 96.4 fL (ref 80.0–100.0)
Platelets: 235 10*3/uL (ref 150–400)
RBC: 4.11 MIL/uL (ref 3.87–5.11)
RDW: 12.3 % (ref 11.5–15.5)
WBC: 11.8 10*3/uL — ABNORMAL HIGH (ref 4.0–10.5)
nRBC: 0 % (ref 0.0–0.2)

## 2019-05-05 LAB — BASIC METABOLIC PANEL
Anion gap: 10 (ref 5–15)
BUN: 5 mg/dL — ABNORMAL LOW (ref 6–20)
CO2: 23 mmol/L (ref 22–32)
Calcium: 9 mg/dL (ref 8.9–10.3)
Chloride: 101 mmol/L (ref 98–111)
Creatinine, Ser: 0.72 mg/dL (ref 0.44–1.00)
GFR calc Af Amer: >60 mL/min (ref >60)
GFR calc non Af Amer: >60 mL/min (ref >60)
Glucose, Bld: 114 mg/dL — ABNORMAL HIGH (ref 70–99)
Potassium: 3.7 mmol/L (ref 3.5–5.1)
Sodium: 134 mmol/L — ABNORMAL LOW (ref 135–145)

## 2019-05-05 LAB — LACTIC ACID, PLASMA
Lactic Acid, Venous: 0.9 mmol/L (ref 0.5–1.9)
Lactic Acid, Venous: 1.3 mmol/L (ref 0.5–1.9)

## 2019-05-05 LAB — HCG, QUANTITATIVE, PREGNANCY: hCG, Beta Chain, Quant, S: <1 m[IU]/mL (ref <5)

## 2019-05-05 MED ORDER — LACTATED RINGERS IV BOLUS
1000.0000 mL | Freq: Once | INTRAVENOUS | Status: AC
  Administered 2019-05-05: 1000 mL via INTRAVENOUS

## 2019-05-05 MED ORDER — OXYCODONE HCL 5 MG PO TABS
5.0000 mg | ORAL_TABLET | ORAL | Status: DC | PRN
  Administered 2019-05-05 – 2019-05-06 (×3): 5 mg via ORAL
  Filled 2019-05-05 (×3): qty 1

## 2019-05-05 MED ORDER — METOCLOPRAMIDE HCL 5 MG/ML IJ SOLN
5.0000 mg | Freq: Once | INTRAMUSCULAR | Status: AC
  Administered 2019-05-05: 5 mg via INTRAVENOUS
  Filled 2019-05-05: qty 2

## 2019-05-05 MED ORDER — ONDANSETRON HCL 4 MG/2ML IJ SOLN: 4.0000 mg | Freq: Four times a day (QID) | INTRAMUSCULAR | Status: DC | PRN

## 2019-05-05 MED ORDER — SODIUM CHLORIDE 0.9 % IV SOLN: 1.0000 g | INTRAVENOUS | Status: DC

## 2019-05-05 MED ORDER — SODIUM CHLORIDE 0.9 % IV SOLN
1.0000 g | Freq: Once | INTRAVENOUS | Status: AC
  Administered 2019-05-05: 1 g via INTRAVENOUS
  Filled 2019-05-05: qty 10

## 2019-05-05 MED ORDER — SENNOSIDES-DOCUSATE SODIUM 8.6-50 MG PO TABS
1.0000 | ORAL_TABLET | Freq: Every day | ORAL | Status: DC
  Administered 2019-05-05: 1 via ORAL
  Filled 2019-05-05 (×3): qty 1

## 2019-05-05 MED ORDER — ACETAMINOPHEN 325 MG PO TABS
650.0000 mg | ORAL_TABLET | Freq: Four times a day (QID) | ORAL | Status: DC | PRN
  Administered 2019-05-05 – 2019-05-06 (×2): 650 mg via ORAL
  Filled 2019-05-05 (×2): qty 2

## 2019-05-05 MED ORDER — DIPHENHYDRAMINE HCL 50 MG/ML IJ SOLN
25.0000 mg | Freq: Once | INTRAMUSCULAR | Status: AC
  Administered 2019-05-05: 25 mg via INTRAVENOUS
  Filled 2019-05-05: qty 1

## 2019-05-05 MED ORDER — MORPHINE SULFATE (PF) 2 MG/ML IV SOLN
1.0000 mg | INTRAVENOUS | Status: DC | PRN
  Administered 2019-05-05 – 2019-05-06 (×3): 1 mg via INTRAVENOUS
  Filled 2019-05-05 (×3): qty 1

## 2019-05-05 MED ORDER — SODIUM CHLORIDE 0.9 % IV SOLN: INTRAVENOUS | Status: AC

## 2019-05-05 MED ORDER — KETOROLAC TROMETHAMINE 15 MG/ML IJ SOLN
15.0000 mg | Freq: Once | INTRAMUSCULAR | Status: AC
  Administered 2019-05-05: 15 mg via INTRAVENOUS
  Filled 2019-05-05: qty 1

## 2019-05-05 NOTE — ED Triage Notes (Signed)
Pt seen yesterday with pyelonephritis. Pt has positive blood cultures and was called to come back in. Pharmacy closed yesterday and didn't start antibiotics

## 2019-05-05 NOTE — ED Provider Notes (Signed)
[facility] EMERGENCY DEPARTMENT Provider Note   CSN: [id number] Arrival date & time: 05/05/19  0913     History Chief Complaint  Patient presents with  . Abnormal Lab    [name] Brown is a 29 y.o. female.  HPI Patient presents after positive blood cultures.  Seen yesterday and diagnosed with pyelonephritis.  States she has had pyelonephritis once before.  Went home.  Continues to have right flank pain and some nausea and vomiting.  Did not get her antibiotics filled since the pharmacies reportedly closed.  Yesterday had fever 102.4.  Blood culture shows gram-negative rods and blood culture ID shows E. coli.    Past Medical History:  Diagnosis Date  . Chronic abdominal pain   . Colitis   . Counseling for birth control, oral contraceptives 06/26/2014  . Medical history non-contributory   . Missed period 06/26/2014    Patient Active Problem List   Diagnosis Date Noted  . Nexplanon in place 06/27/2017  . 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  . Screening examination for STD (sexually transmitted disease) 06/27/2017  . Vaginal discharge 06/27/2017  . Nexplanon insertion 06/19/2017  . 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/2017  . Status post primary low transverse cesarean section 04/14/2017  . Anemia in pregnancy 04/13/2017  . BV (bacterial vaginosis) 09/11/2016  . Abdominal pain 04/22/2014    Past Surgical History:  Procedure Laterality Date  . CESAREAN SECTION N/A 04/13/2017   Procedure: CESAREAN SECTION;  Surgeon: [place] Bing, MD;  Location: [facility] BIRTHING SU[facility];  Service: Obstetrics;  Laterality: N/A;  . NO PAST SURGERIES       OB History    Gravida  1   Para  1   Term  1   Preterm  0   AB  0   Living  1     SAB  0   TAB  0   Ectopic  0   Multiple  0   Live Births  1           Family History  Problem Relation Age of Onset  . Cancer Mother        cervical  . Alcohol abuse Mother   . Other Father        fatty liver  . Cancer Maternal Grandmother          skin  . Dementia Maternal Grandmother   . Dementia Paternal Grandmother   . Diabetes Paternal Grandfather   . Dementia Paternal Grandfather   . Jaundice Daughter     Social History   Tobacco Use  . Smoking status: Never Smoker  . Smokeless tobacco: Never Used  Substance Use Topics  . Alcohol use: No    Comment: occ  . Drug use: No    Home Medications Prior to Admission medications   Medication Sig Start Date End Date Taking? Authorizing Provider  cephALEXin (KEFLEX) 500 MG capsule Take 1 capsule (500 mg total) by mouth 3 (three) times daily for 10 days. 05/04/19 05/14/19  Burgess Amor, PA-C  cetirizine (ZYRTEC) 10 MG tablet Take 1 tablet (10 mg total) by mouth daily. 03/30/19   Wurst, [place], PA-C  flintstones complete (FLINTSTONES) 60 MG chewable tablet Chew 2 tablets by mouth daily.    [provider]  fluticasone (FLONASE) 50 MCG/ACT nasal spray Place 2 sprays into both nostrils daily. Patient not taking: Reported on 05/04/2019 03/30/19   Wurst, [place], PA-C  ondansetron (ZOFRAN) 4 MG tablet Take 1 tablet (4 mg total) by mouth every 6 (six) hours. 05/04/19  Burgess Amor, PA-C  escitalopram (LEXAPRO) 20 MG tablet Take 1 tablet (20 mg total) by mouth daily. Patient not taking: Reported on 09/17/2018 06/27/17 03/30/19  Adline Potter, NP  Norethindrone-Ethinyl Estradiol-Fe Biphas (LO LOESTRIN FE) 1 MG-10 MCG / 10 MCG tablet Take 1 tablet by mouth daily. Patient not taking: Reported on 09/17/2018 08/21/17 03/30/19  Cresenzo-Dishmon, Scarlette Calico, CNM    Allergies    Mineral oil  Review of Systems   Review of Systems  Constitutional: Positive for appetite change and chills.  HENT: Negative for congestion.   Cardiovascular: Negative for chest pain.  Gastrointestinal: Positive for nausea and vomiting. Negative for abdominal pain.  Genitourinary: Positive for dysuria and flank pain.  Musculoskeletal: Negative for back pain.  Skin: Negative for rash.  Neurological: Negative  for weakness.  Psychiatric/Behavioral: Negative for confusion.    Physical Exam Updated Vital Signs BP 114/70 (BP Location: Left Arm)   Pulse (!) 108   Temp 98.8 F (37.1 C) (Oral)   Resp 16   Ht 5\' 4"  (1.626 m)   Wt 72 kg   LMP 05/02/2019 Comment: neg preg  SpO2 99%   BMI 27.25 kg/m   Physical Exam Vitals reviewed.  HENT:     Head: Normocephalic.     Mouth/Throat:     Mouth: Mucous membranes are moist.  Cardiovascular:     Comments: Mild tachycardia Pulmonary:     Breath sounds: No wheezing, rhonchi or rales.  Abdominal:     Tenderness: There is no abdominal tenderness.  Genitourinary:    Comments: CVA tenderness on right. Musculoskeletal:        General: No tenderness.     Cervical back: Neck supple.  Skin:    General: Skin is warm.     Capillary Refill: Capillary refill takes less than 2 seconds.  Neurological:     Mental Status: She is alert and oriented to person, place, and time.     ED Results / Procedures / Treatments   Labs (all labs ordered are listed, but only abnormal results are displayed) Labs Reviewed  CBC - Abnormal; Notable for the following components:      Result Value   WBC 11.8 (*)    All other components within normal limits  BASIC METABOLIC PANEL - Abnormal; Notable for the following components:   Sodium 134 (*)    Glucose, Bld 114 (*)    BUN 5 (*)    All other components within normal limits  SARS CORONAVIRUS 2 (TAT 6-24 HRS)  LACTIC ACID, PLASMA  LACTIC ACID, PLASMA    EKG None  Radiology CT Renal Stone Study  Result Date: 05/04/2019 CLINICAL DATA:  Right flank pain EXAM: CT ABDOMEN AND PELVIS WITHOUT CONTRAST TECHNIQUE: Multidetector CT imaging of the abdomen and pelvis was performed following the standard protocol without IV contrast. COMPARISON:  2017 FINDINGS: Lower chest: No acute abnormality. Hepatobiliary: No focal liver abnormality is seen. No gallstones, gallbladder wall thickening, or biliary dilatation. Pancreas:  Unremarkable. Spleen: Unremarkable. Adrenals/Urinary Tract: Mild right perinephric stranding. Mild prominence of the right ureter without obstructing stone. Left kidney is unremarkable. Bladder is unremarkable. Adrenals are normal Stomach/Bowel: Stomach is within normal limits. Bowel is normal in caliber. Normal appendix. Vascular/Lymphatic: No significant vascular findings on this noncontrast study. No enlarged lymph nodes identified. Reproductive: Uterus and bilateral adnexa are unremarkable. Other: No abdominal wall hernia.  No ascites. Musculoskeletal: No significant or acute osseous abnormality. IMPRESSION: Mild prominence of the right ureter and right perinephric stranding without  obstructing stone. Possible that the stone has recently passed. Pyelonephritis cannot be excluded on this noncontrast study. Electronically Signed   By: Macy Mis M.D.   On: 05/04/2019 13:31    Procedures Procedures (including critical care time)  Medications Ordered in ED Medications  cefTRIAXone (ROCEPHIN) 1 g in sodium chloride 0.9 % 100 mL IVPB (has no administration in time range)  lactated ringers bolus 1,000 mL (has no administration in time range)    ED Course  I have reviewed the triage vital signs and the nursing notes.  Pertinent labs & imaging results that were available during my care of the patient were reviewed by me and considered in my medical decision making (see chart for details).    MDM Rules/Calculators/A&P                      Patient sent in for bacteremia.  E. coli in blood.  Also pyelonephritis.  Well-appearing with mild tachycardia.  Rocephin given yesterday and will repeat today.  Susceptibilities not back yet.  Will admit to hospitalist. Normal lactic acid and white count improved from yesterday. Final Clinical Impression(s) / ED Diagnoses Final diagnoses:  Pyelonephritis  Bacteremia due to Escherichia coli    Rx / DC Orders ED Discharge Orders    None         Davonna Belling, MD 05/05/19 [phone]

## 2019-05-05 NOTE — H&P (Signed)
History and Physical  [name] Brown KDX:[id number] DOB: 04-04-1990 DOA: 05/05/2019  Referring physician: Dr. Alvino Chapel  PCP: [facility]  Outpatient Specialists: None Patient coming from: Home Chief Complaint: Call back from ED due to positive blood cultures.  HPI: [name] Brown is a 29 y.o. 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  Symptoms associated with chills and night sweats.  UA positive for pyuria, leukocytosis 15 K and neutrophilia 13 K.  CT renal 4/11 showed evidence of right pyelonephritis.  She received a dose of Rocephin on 4/11 in the ED, was prescribed Keflex and Zofran and discharged home.  She did not pick up her prescriptions due to the pharmacy being closed.  Blood cultures came back positive for E. coli.  Patient was called back to the ED.  [facility] asked to admit.   ED Course: Lab studies remarkable for improved leukocytosis down to 11.9 K.  Right flank pain improved down to 7 out of 10.  Reports persistent chills at home and night sweats.  Also reports a 10 out of 10 headache while on her menses and nausea with no vomiting.  Review of Systems: Review of systems as noted in the HPI. All other systems reviewed and are negative.   Past Medical History:  Diagnosis Date  . Chronic abdominal pain   . Colitis   . Counseling for birth control, oral contraceptives 06/26/2014  . Medical history non-contributory   . Missed period 06/26/2014   Past Surgical History:  Procedure Laterality Date  . CESAREAN SECTION N/A 04/13/2017   Procedure: CESAREAN SECTION;  Surgeon: Aletha Halim, MD;  Location: [place];  Service: Obstetrics;  Laterality: N/A;  . NO PAST SURGERIES      Social History:  reports that she has never smoked. She has never used smokeless tobacco. She reports that she does  not drink alcohol or use drugs.   Allergies  Allergen Reactions  . Mineral Oil Rash    Baby oil    Family History  Problem Relation Age of Onset  . Cancer Mother        cervical  . Alcohol abuse Mother   . Other Father        fatty liver  . Cancer Maternal Grandmother        skin  . Dementia Maternal Grandmother   . Dementia Paternal Grandmother   . Diabetes Paternal Grandfather   . Dementia Paternal Grandfather   . Jaundice Daughter      Prior to Admission medications   Medication Sig Start Date End Date Taking? Authorizing Provider  cephALEXin (KEFLEX) 500 MG capsule Take 1 capsule (500 mg total) by mouth 3 (three) times daily for 10 days. 05/04/19 05/14/19  Evalee Jefferson, PA-C  cetirizine (ZYRTEC) 10 MG tablet Take 1 tablet (10 mg total) by mouth daily. 03/30/19   Wurst, [place], PA-C  flintstones complete (FLINTSTONES) 60 MG chewable tablet Chew 2 tablets by mouth daily.    [provider]  fluticasone (FLONASE) 50 MCG/ACT nasal spray Place 2 sprays into both nostrils daily. Patient not taking: Reported on 05/04/2019 03/30/19   Wurst, [place], PA-C  ondansetron (ZOFRAN) 4 MG tablet Take 1 tablet (4 mg total) by mouth every 6 (six) hours. 05/04/19   Evalee Jefferson, PA-C  escitalopram (LEXAPRO) 20 MG tablet Take 1 tablet (20 mg total) by  mouth daily. Patient not taking: Reported on 09/17/2018 06/27/17 03/30/19  Adline Potter, NP  Norethindrone-Ethinyl Estradiol-Fe Biphas (LO LOESTRIN FE) 1 MG-10 MCG / 10 MCG tablet Take 1 tablet by mouth daily. Patient not taking: Reported on 09/17/2018 08/21/17 03/30/19  Jacklyn Shell, CNM    Physical Exam: BP 102/67   Pulse 97   Temp 98.8 F (37.1 C) (Oral)   Resp 16   Ht 5\' 4"  (1.626 m)   Wt 72 kg   LMP 05/02/2019 Comment: neg preg  SpO2 100%   BMI 27.25 kg/m   . General: 29 y.o. year-old female well developed well nourished in no acute distress.  Alert and oriented x3. . Cardiovascular: Regular rate and rhythm with no  rubs or gallops.  No thyromegaly or JVD noted.  No lower extremity edema. 2/4 pulses in all 4 extremities. 30 Respiratory: Clear to auscultation with no wheezes or rales. Good inspiratory effort. . Abdomen: Soft nontender nondistended with normal bowel sounds x4 quadrants.  Right flank pain. . Muskuloskeletal: No cyanosis, clubbing or edema noted bilaterally . Neuro: CN II-XII intact, strength, sensation, reflexes . Skin: No ulcerative lesions noted or rashes . Psychiatry: Judgement and insight appear normal. Mood is appropriate for condition and setting          Labs on Admission:  Basic Metabolic Panel: Recent Labs  Lab 05/04/19 1053 05/05/19 1002  NA 136 134*  K 3.9 3.7  CL 105 101  CO2 23 23  GLUCOSE 104* 114*  BUN 10 5*  CREATININE 0.66 0.72  CALCIUM 9.1 9.0   Liver Function Tests: Recent Labs  Lab 05/04/19 1053  AST 21  ALT 21  ALKPHOS 62  BILITOT 1.0  PROT 7.3  ALBUMIN 3.9   No results for input(s): LIPASE, AMYLASE in the last 168 hours. No results for input(s): AMMONIA in the last 168 hours. CBC: Recent Labs  Lab 05/04/19 1053 05/05/19 1002  WBC 15.2* 11.8*  NEUTROABS 13.8*  --   HGB 13.4 12.9  HCT 40.0 39.6  MCV 94.6 96.4  PLT 236 235   Cardiac Enzymes: No results for input(s): CKTOTAL, CKMB, CKMBINDEX, TROPONINI in the last 168 hours.  BNP (last 3 results) No results for input(s): BNP in the last 8760 hours.  ProBNP (last 3 results) No results for input(s): PROBNP in the last 8760 hours.  CBG: No results for input(s): GLUCAP in the last 168 hours.  Radiological Exams on Admission: CT Renal Stone Study  Result Date: 05/04/2019 CLINICAL DATA:  Right flank pain EXAM: CT ABDOMEN AND PELVIS WITHOUT CONTRAST TECHNIQUE: Multidetector CT imaging of the abdomen and pelvis was performed following the standard protocol without IV contrast. COMPARISON:  2017 FINDINGS: Lower chest: No acute abnormality. Hepatobiliary: No focal liver abnormality is seen. No  gallstones, gallbladder wall thickening, or biliary dilatation. Pancreas: Unremarkable. Spleen: Unremarkable. Adrenals/Urinary Tract: Mild right perinephric stranding. Mild prominence of the right ureter without obstructing stone. Left kidney is unremarkable. Bladder is unremarkable. Adrenals are normal Stomach/Bowel: Stomach is within normal limits. Bowel is normal in caliber. Normal appendix. Vascular/Lymphatic: No significant vascular findings on this noncontrast study. No enlarged lymph nodes identified. Reproductive: Uterus and bilateral adnexa are unremarkable. Other: No abdominal wall hernia.  No ascites. Musculoskeletal: No significant or acute osseous abnormality. IMPRESSION: Mild prominence of the right ureter and right perinephric stranding without obstructing stone. Possible that the stone has recently passed. Pyelonephritis cannot be excluded on this noncontrast study. Electronically Signed   By: 2018  M.D.   On: 05/04/2019 13:31    EKG: I independently viewed the EKG done and my findings are as followed: Sinus tachycardia rate of 116 done on 05/04/2019.  Assessment/Plan Present on Admission: . Bacteremia due to Escherichia coli  Active Problems:   Bacteremia due to Escherichia coli   E. coli bacteremia likely from urinary source in the setting of right pyelonephritis and presumed UTI Presented on 05/04/2019 with pyuria, dysuria and right flank pain Febrile with T-max of 102.4 on 05/04/2019 CT renal showed evidence of right pyelonephritis Received 1 dose of Rocephin on 05/04/2019 with improvement of leukocytosis from 13 K to 11.8 K Urine culture in process Blood culture positive for E. coli 1 out of 2 bottles, continue to follow cultures and sensitivities Obtain blood cultures x2 peripherally 4/12, closely follow cultures and sensitivities Start IV Rocephin 1 g daily; de-escalate IV antibiotics according to sensitivities Start IV fluid normal saline at 75 cc/h x 1 day  Encourage oral intake Maintain MAP greater than 65  Presumed UTI/right pyelonephritis Management per above Self report prior pyonephritis at the age of 40. Monitor fever curve and WBC Follow-up urine cultures, currently in process. Obtain CBC with differential in the morning  Intractable nausea with no vomiting Likely related to her right pyelonephritis IV antiemetics as needed Encourage oral intake as tolerated  Transient headache Unclear etiology Possibly secondary to dehydration with poor oral intake Treat symptomatically for now  Mild hypovolemic hyponatremia Presented with serum sodium 134 Hypovolemic on exam Reports poor oral intake with nausea and no vomiting Start normal saline at 75 cc/h x 1 day    DVT prophylaxis: SCDs  Code Status: Full code per the patient herself.  Family Communication: We will call family if okay with the patient.  Disposition Plan: Admit to MedSurg unit.  Consults called: None.  Admission status: Observation status.    Darlin Drop MD [facility] Pager [phone]  If 7PM-7AM, please contact night-coverage [email or web address] Password [facility]  05/05/2019, 11:14 AM

## 2019-05-06 LAB — CBC WITH DIFFERENTIAL/PLATELET
Abs Immature Granulocytes: 0.06 10*3/uL (ref 0.00–0.07)
Basophils Absolute: 0 10*3/uL (ref 0.0–0.1)
Basophils Relative: 0 %
Eosinophils Absolute: 0.1 10*3/uL (ref 0.0–0.5)
Eosinophils Relative: 1 %
HCT: 35 % — ABNORMAL LOW (ref 36.0–46.0)
Hemoglobin: 11 g/dL — ABNORMAL LOW (ref 12.0–15.0)
Immature Granulocytes: 1 %
Lymphocytes Relative: 19 %
Lymphs Abs: 1.8 10*3/uL (ref 0.7–4.0)
MCH: 30.7 pg (ref 26.0–34.0)
MCHC: 31.4 g/dL (ref 30.0–36.0)
MCV: 97.8 fL (ref 80.0–100.0)
Monocytes Absolute: 1.5 10*3/uL — ABNORMAL HIGH (ref 0.1–1.0)
Monocytes Relative: 16 %
Neutro Abs: 6 10*3/uL (ref 1.7–7.7)
Neutrophils Relative %: 63 %
Platelets: 228 10*3/uL (ref 150–400)
RBC: 3.58 MIL/uL — ABNORMAL LOW (ref 3.87–5.11)
RDW: 12.3 % (ref 11.5–15.5)
WBC: 9.5 10*3/uL (ref 4.0–10.5)
nRBC: 0 % (ref 0.0–0.2)

## 2019-05-06 LAB — BASIC METABOLIC PANEL
Anion gap: 6 (ref 5–15)
BUN: 8 mg/dL (ref 6–20)
CO2: 26 mmol/L (ref 22–32)
Calcium: 8.3 mg/dL — ABNORMAL LOW (ref 8.9–10.3)
Chloride: 105 mmol/L (ref 98–111)
Creatinine, Ser: 0.74 mg/dL (ref 0.44–1.00)
GFR calc Af Amer: >60 mL/min (ref >60)
GFR calc non Af Amer: >60 mL/min (ref >60)
Glucose, Bld: 89 mg/dL (ref 70–99)
Potassium: 4 mmol/L (ref 3.5–5.1)
Sodium: 137 mmol/L (ref 135–145)

## 2019-05-06 MED ORDER — SODIUM CHLORIDE 0.9 % IV SOLN
2.0000 g | INTRAVENOUS | Status: DC
  Administered 2019-05-06: 2 g via INTRAVENOUS
  Filled 2019-05-06: qty 20

## 2019-05-06 MED ORDER — LEVOFLOXACIN 500 MG PO TABS: 500.0000 mg | ORAL_TABLET | Freq: Every day | ORAL | 0 refills | Status: AC

## 2019-05-06 NOTE — Discharge Summary (Signed)
Physician Discharge Summary  [name] Brown ZOX:[id number] DOB: 02/06/1990 DOA: 05/05/2019  PCP: [facility], [facility]  Admit date: 05/05/2019  Discharge date: 05/06/2019  Admitted From:Home  Disposition:  Home  Recommendations for Outpatient Follow-up:  1. Follow up with PCP in 1-2 weeks 2. Continue on Levaquin as prescribed for 7 days to complete course of treatment 3. Follow-up microbiology sensitivities.  Patient has had prior E. coli infections that have been pansensitive  Home Health: None  Equipment/Devices: None  Discharge Condition: Stable  CODE STATUS: Full  Diet recommendation: Regular  Brief/Interim Summary: Per HPI:  [name] Brown is a 29 y.o. 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  Symptoms associated with chills and night sweats.  UA positive for pyuria, leukocytosis 15 K and neutrophilia 13 K.  CT renal 4/11 showed evidence of right pyelonephritis.  She received a dose of Rocephin on 4/11 in the ED, was prescribed Keflex and Zofran and discharged home.  She did not pick up her prescriptions due to the pharmacy being closed.  Blood cultures came back positive for E. coli.  Patient was called back to the ED.  TRH asked to admit.   4/13: Patient was admitted with concerns of E. coli bacteremia related to her pyelonephritis/UTI.  She appears to be doing quite well this morning and denies any further complaints or concerns.  No fevers have been noted.  Her leukocytosis has improved while on IV Rocephin.  She has previously had E. coli that has been pansensitive and it appears appropriate to discharge her on Levaquin daily for 7 days to complete course of treatment for her pyelonephritis as well as E. coli bacteremia.  She denies any further nausea or vomiting and is able to have breakfast today.  She  is stable for discharge.  Discharge Diagnoses:  Active Problems:   Bacteremia due to Escherichia coli  Principal discharge diagnosis: E. coli bacteremia secondary to right-sided pyelonephritis with UTI.  Discharge Instructions  Discharge Instructions    Diet - low sodium heart healthy   Complete by: As directed    Increase activity slowly   Complete by: As directed      Allergies as of 05/06/2019      Reactions   Mineral Oil Rash   Baby oil      Medication List    STOP taking these medications   cephALEXin 500 MG capsule Commonly known as: KEFLEX     TAKE these medications   cetirizine 10 MG tablet Commonly known as: ZYRTEC Take 1 tablet (10 mg total) by mouth daily.   fluticasone 50 MCG/ACT nasal spray Commonly known as: FLONASE Place 2 sprays into both nostrils daily.   levofloxacin 500 MG tablet Commonly known as: Levaquin Take 1 tablet (500 mg total) by mouth daily for 7 days.   ondansetron 4 MG tablet Commonly known as: ZOFRAN Take 1 tablet (4 mg total) by mouth every 6 (six) hours.      Follow-up Information    Pllc, [facility] Follow up in 1 week(s).   Specialty: Family Medicine Contact information: [address] [place] [id number]          Allergies  Allergen Reactions  . Mineral Oil Rash    Baby oil    Consultations:  None   Procedures/Studies: CT Renal Stone Study  Result Date: 05/04/2019 CLINICAL DATA:  Right flank pain EXAM: CT ABDOMEN AND PELVIS WITHOUT CONTRAST TECHNIQUE: Multidetector CT imaging of the abdomen and pelvis was performed following the standard protocol without IV contrast. COMPARISON:  2017 FINDINGS: Lower chest: No acute abnormality. Hepatobiliary: No focal liver abnormality is seen. No gallstones, gallbladder wall thickening, or biliary dilatation. Pancreas: Unremarkable. Spleen: Unremarkable. Adrenals/Urinary Tract: Mild right perinephric stranding. Mild prominence of  the right ureter without obstructing stone. Left kidney is unremarkable. Bladder is unremarkable. Adrenals are normal Stomach/Bowel: Stomach is within normal limits. Bowel is normal in caliber. Normal appendix. Vascular/Lymphatic: No significant vascular findings on this noncontrast study. No enlarged lymph nodes identified. Reproductive: Uterus and bilateral adnexa are unremarkable. Other: No abdominal wall hernia.  No ascites. Musculoskeletal: No significant or acute osseous abnormality. IMPRESSION: Mild prominence of the right ureter and right perinephric stranding without obstructing stone. Possible that the stone has recently passed. Pyelonephritis cannot be excluded on this noncontrast study. Electronically Signed   By: Guadlupe Spanish M.D.   On: 05/04/2019 13:31     Discharge Exam: Vitals:   05/06/19 0051 05/06/19 0545  BP: 95/60 98/62  Pulse: 82 90  Resp: 20 20  Temp: 98 F (36.7 C) 99.9 F (37.7 C)  SpO2: 99% 97%   Vitals:   05/05/19 1707 05/05/19 2102 05/06/19 0051 05/06/19 0545  BP: 101/66 94/68 95/60  98/62  Pulse: 90 89 82 90  Resp: Temp: 98.3 F (36.8 C) 99.3 F (37.4 C) 98 F (36.7 C) 99.9 F (37.7 C)  TempSrc: Oral Oral Oral Oral  SpO2: 99% 99% 99% 97%  Weight:      Height:        General: Pt is alert, awake, not in acute distress Cardiovascular: RRR, S1/S2 +, no rubs, no gallops Respiratory: CTA bilaterally, no wheezing, no rhonchi Abdominal: Soft, NT, ND, bowel sounds + Extremities: no edema, no cyanosis    The results of significant diagnostics from this hospitalization (including imaging, microbiology, ancillary and laboratory) are listed below for reference.     Microbiology: Recent Results (from the past 240 hour(s))  Urine culture     Status: Abnormal (Preliminary result)   Collection Time: 05/04/19 10:47 AM   Specimen: In/Out Cath Urine  Result Value Ref Range Status   Specimen Description   Final    IN/OUT CATH URINE Performed at  [facility], [address] [place], [place] [address]    Special Requests   Final    NONE Performed at [facility], [address] [place], [place] [address]    Culture >=100,000 COLONIES/mL GRAM NEGATIVE RODS (A)  Final   Report Status PENDING  Incomplete  Blood culture (routine x 2)     Status: None (Preliminary result)   Collection Time: 05/04/19 10:53 AM   Specimen: Left Antecubital; Blood  Result Value Ref Range Status   Specimen Description   Final    LEFT ANTECUBITAL BOTTLES DRAWN AEROBIC AND ANAEROBIC   Special Requests Blood Culture adequate volume  Final   Culture   Final    NO GROWTH < 24 HOURS Performed at [facility], [address], [place], [place] [address]    Report Status PENDING  Incomplete  Blood culture (routine x 2)     Status: Abnormal (Preliminary result)   Collection Time: 05/04/19 10:56 AM   Specimen: Right Antecubital; Blood  Result Value Ref Range Status   Specimen Description   Final    RIGHT ANTECUBITAL  BOTTLES DRAWN AEROBIC AND ANAEROBIC Performed at [facility], [address] [place], [place] [address]    Special Requests   Final    Blood Culture adequate volume Performed at [facility], [address] [place], [place] [address]    Culture  Setup Time   Final    GRAM NEGATIVE RODS Gram Stain Report Called to,Read Back By and Verified With: DOSS,M. AT 0002 ON 05/05/2019 BY EVA ANAEROBIC BOTTLE ONLY Performed at [facility] CRITICAL RESULT CALLED TO, READ BACK BY AND VERIFIED WITH: Laney Pastor RN [phone] 05/05/2019 T. TYSOR    Culture (A)  Final    ESCHERICHIA COLI SUSCEPTIBILITIES TO FOLLOW Performed at [facility], [address] [place], [place] [address]    Report Status PENDING  Incomplete  Blood Culture ID Panel (Reflexed)     Status: Abnormal   Collection Time: 05/04/19 10:56 AM  Result Value Ref Range Status   Enterococcus species NOT DETECTED NOT DETECTED Final   Listeria monocytogenes NOT DETECTED NOT  DETECTED Final   Staphylococcus species NOT DETECTED NOT DETECTED Final   Staphylococcus aureus (BCID) NOT DETECTED NOT DETECTED Final   Streptococcus species NOT DETECTED NOT DETECTED Final   Streptococcus agalactiae NOT DETECTED NOT DETECTED Final   Streptococcus pneumoniae NOT DETECTED NOT DETECTED Final   Streptococcus pyogenes NOT DETECTED NOT DETECTED Final   Acinetobacter baumannii NOT DETECTED NOT DETECTED Final   Enterobacteriaceae species DETECTED (A) NOT DETECTED Final    Comment: Enterobacteriaceae represent a large family of gram-negative bacteria, not a single organism. CRITICAL RESULT CALLED TO, READ BACK BY AND VERIFIED WITH: Laney Pastor RN [phone] 05/05/2019 T. TYSOR    Enterobacter cloacae complex NOT DETECTED NOT DETECTED Final   Escherichia coli DETECTED (A) NOT DETECTED Final    Comment: CRITICAL RESULT CALLED TO, READ BACK BY AND VERIFIED WITH: Laney Pastor RN [phone] 05/05/2019 T. TYSOR    Klebsiella oxytoca NOT DETECTED NOT DETECTED Final   Klebsiella pneumoniae NOT DETECTED NOT DETECTED Final   Proteus species NOT DETECTED NOT DETECTED Final   Serratia marcescens NOT DETECTED NOT DETECTED Final   Carbapenem resistance NOT DETECTED NOT DETECTED Final   Haemophilus influenzae NOT DETECTED NOT DETECTED Final   Neisseria meningitidis NOT DETECTED NOT DETECTED Final   Pseudomonas aeruginosa NOT DETECTED NOT DETECTED Final   Candida albicans NOT DETECTED NOT DETECTED Final   Candida glabrata NOT DETECTED NOT DETECTED Final   Candida krusei NOT DETECTED NOT DETECTED Final   Candida parapsilosis NOT DETECTED NOT DETECTED Final   Candida tropicalis NOT DETECTED NOT DETECTED Final    Comment: Performed at [facility], [address] [place], [place] [address]  SARS CORONAVIRUS 2 (TAT 6-24 HRS) Nasopharyngeal Nasopharyngeal Swab     Status: None   Collection Time: 05/05/19 10:08 AM   Specimen: Nasopharyngeal Swab  Result Value Ref Range Status   SARS  Coronavirus 2 NEGATIVE NEGATIVE Final    Comment: (NOTE) SARS-CoV-2 target nucleic acids are NOT DETECTED. The SARS-CoV-2 RNA is generally detectable in upper and lower respiratory specimens during the acute phase of infection. Negative results do not preclude SARS-CoV-2 infection, do not rule out co-infections with other pathogens, and should not be used as the sole basis for treatment or other patient management decisions. Negative results must be combined with clinical observations, patient history, and epidemiological information. The expected result is Negative. Fact Sheet for Patients: HairSlick.no Fact Sheet for Healthcare Providers: quierodirigir.com This test is not yet  approved or cleared by the [place] and  has been authorized for detection and/or diagnosis of SARS-CoV-2 by FDA under an Emergency Use Authorization (EUA). This EUA will remain  in effect (meaning this test can be used) for the duration of the COVID-19 declaration under Section 56 4(b)(1) of the Act, 21 U.S.C. section 360bbb-3(b)(1), unless the authorization is terminated or revoked sooner. Performed at [facility], [address] [place], [place] [address]   Culture, blood (routine x 2)     Status: None (Preliminary result)   Collection Time: 05/05/19 11:25 AM   Specimen: BLOOD  Result Value Ref Range Status   Specimen Description BLOOD RIGHT ANTECUBITAL  Final   Special Requests   Final    BOTTLES DRAWN AEROBIC AND ANAEROBIC Blood Culture adequate volume Performed at [facility], [address] [place], [place] [address]    Culture PENDING  Incomplete   Report Status PENDING  Incomplete     Labs: BNP (last 3 results) No results for input(s): BNP in the last 8760 hours. Basic Metabolic Panel: Recent Labs  Lab 05/04/19 1053 05/05/19 1002 05/06/19 0422  NA 136 134* 137  K 3.9 3.7 4.0  CL 105 101 105  CO2 23 23 26   GLUCOSE  104* 114* 89  BUN 10 5* 8  CREATININE 0.66 0.72 0.74  CALCIUM 9.1 9.0 8.3*   Liver Function Tests: Recent Labs  Lab 05/04/19 1053  AST 21  ALT 21  ALKPHOS 62  BILITOT 1.0  PROT 7.3  ALBUMIN 3.9   No results for input(s): LIPASE, AMYLASE in the last 168 hours. No results for input(s): AMMONIA in the last 168 hours. CBC: Recent Labs  Lab 05/04/19 1053 05/05/19 1002 05/06/19 0422  WBC 15.2* 11.8* 9.5  NEUTROABS 13.8*  --  6.0  HGB 13.4 12.9 11.0*  HCT 40.0 39.6 35.0*  MCV 94.6 96.4 97.8  PLT 236 235 228   Cardiac Enzymes: No results for input(s): CKTOTAL, CKMB, CKMBINDEX, TROPONINI in the last 168 hours. BNP: Invalid input(s): POCBNP CBG: No results for input(s): GLUCAP in the last 168 hours. D-Dimer No results for input(s): DDIMER in the last 72 hours. Hgb A1c No results for input(s): HGBA1C in the last 72 hours. Lipid Profile No results for input(s): CHOL, HDL, LDLCALC, TRIG, CHOLHDL, LDLDIRECT in the last 72 hours. Thyroid function studies No results for input(s): TSH, T4TOTAL, T3FREE, THYROIDAB in the last 72 hours.  Invalid input(s): FREET3 Anemia work up No results for input(s): VITAMINB12, FOLATE, FERRITIN, TIBC, IRON, RETICCTPCT in the last 72 hours. Urinalysis    Component Value Date/Time   COLORURINE YELLOW 05/04/2019 1043   APPEARANCEUR CLEAR 05/04/2019 1043   APPEARANCEUR Clear 09/20/2016 1234   LABSPEC 1.011 05/04/2019 1043   PHURINE 7.0 05/04/2019 1043   GLUCOSEU NEGATIVE 05/04/2019 1043   HGBUR SMALL (A) 05/04/2019 1043   BILIRUBINUR NEGATIVE 05/04/2019 1043   BILIRUBINUR negative 03/30/2019 1502   BILIRUBINUR Negative 09/20/2016 1234   KETONESUR NEGATIVE 05/04/2019 1043   PROTEINUR NEGATIVE 05/04/2019 1043   UROBILINOGEN 0.2 03/30/2019 1502   UROBILINOGEN 0.2 03/11/2014 1817   NITRITE NEGATIVE 05/04/2019 1043   LEUKOCYTESUR SMALL (A) 05/04/2019 1043   Sepsis Labs Invalid input(s): PROCALCITONIN,  WBC,   LACTICIDVEN Microbiology Recent Results (from the past 240 hour(s))  Urine culture     Status: Abnormal (Preliminary result)   Collection Time: 05/04/19 10:47 AM   Specimen: In/Out Cath Urine  Result Value Ref Range Status   Specimen  Description   Final    IN/OUT CATH URINE Performed at [facility] [address], [place], [place] [address]    Special Requests   Final    NONE Performed at [facility], [address] [place], [place] [address]    Culture >=100,000 COLONIES/mL GRAM NEGATIVE RODS (A)  Final   Report Status PENDING  Incomplete  Blood culture (routine x 2)     Status: None (Preliminary result)   Collection Time: 05/04/19 10:53 AM   Specimen: Left Antecubital; Blood  Result Value Ref Range Status   Specimen Description   Final    LEFT ANTECUBITAL BOTTLES DRAWN AEROBIC AND ANAEROBIC   Special Requests Blood Culture adequate volume  Final   Culture   Final    NO GROWTH < 24 HOURS Performed at [facility], [address] [place], [place] [address]    Report Status PENDING  Incomplete  Blood culture (routine x 2)     Status: Abnormal (Preliminary result)   Collection Time: 05/04/19 10:56 AM   Specimen: Right Antecubital; Blood  Result Value Ref Range Status   Specimen Description   Final    RIGHT ANTECUBITAL BOTTLES DRAWN AEROBIC AND ANAEROBIC Performed at [facility], [address], [place], [place] [address]    Special Requests   Final    Blood Culture adequate volume Performed at [facility], [address], [place], [place] [address]    Culture  Setup Time   Final    GRAM NEGATIVE RODS Gram Stain Report Called to,Read Back By and Verified With: DOSS,M. AT 0002 ON 05/05/2019 BY EVA ANAEROBIC BOTTLE ONLY Performed at [facility] CRITICAL RESULT CALLED TO, READ BACK BY AND VERIFIED WITH: Laney Pastor RN [phone] 05/05/2019 T. TYSOR    Culture (A)  Final    ESCHERICHIA COLI SUSCEPTIBILITIES TO FOLLOW Performed at [facility], [address], [place], [place] [address]    Report Status PENDING  Incomplete  Blood Culture ID Panel (Reflexed)     Status: Abnormal   Collection Time: 05/04/19 10:56 AM  Result Value Ref Range Status   Enterococcus species NOT DETECTED NOT DETECTED Final   Listeria monocytogenes NOT DETECTED NOT DETECTED Final   Staphylococcus species NOT DETECTED NOT DETECTED Final   Staphylococcus aureus (BCID) NOT DETECTED NOT DETECTED Final   Streptococcus species NOT DETECTED NOT DETECTED Final   Streptococcus agalactiae NOT DETECTED NOT DETECTED Final   Streptococcus pneumoniae NOT DETECTED NOT DETECTED Final   Streptococcus pyogenes NOT DETECTED NOT DETECTED Final   Acinetobacter baumannii NOT DETECTED NOT DETECTED Final   Enterobacteriaceae species DETECTED (A) NOT DETECTED Final    Comment: Enterobacteriaceae represent a large family of gram-negative bacteria, not a single organism. CRITICAL RESULT CALLED TO, READ BACK BY AND VERIFIED WITH: Laney Pastor RN [phone] 05/05/2019 T. TYSOR    Enterobacter cloacae complex NOT DETECTED NOT DETECTED Final   Escherichia coli DETECTED (A) NOT DETECTED Final    Comment: CRITICAL RESULT CALLED TO, READ BACK BY AND VERIFIED WITH: Laney Pastor RN [phone] 05/05/2019 T. TYSOR    Klebsiella oxytoca NOT DETECTED NOT DETECTED Final   Klebsiella pneumoniae NOT DETECTED NOT DETECTED Final   Proteus species NOT DETECTED NOT DETECTED Final   Serratia marcescens NOT DETECTED NOT DETECTED Final   Carbapenem resistance NOT DETECTED NOT DETECTED Final   Haemophilus influenzae NOT DETECTED NOT DETECTED Final   Neisseria meningitidis NOT DETECTED NOT DETECTED Final   Pseudomonas aeruginosa NOT DETECTED NOT DETECTED  Final   Candida albicans NOT DETECTED NOT DETECTED Final   Candida glabrata NOT DETECTED NOT DETECTED Final   Candida krusei NOT DETECTED NOT DETECTED Final   Candida parapsilosis NOT DETECTED NOT DETECTED Final   Candida tropicalis NOT DETECTED NOT DETECTED  Final    Comment: Performed at [facility], [place] [address], [place], [place] [address]  SARS CORONAVIRUS 2 (TAT 6-24 HRS) Nasopharyngeal Nasopharyngeal Swab     Status: None   Collection Time: 05/05/19 10:08 AM   Specimen: Nasopharyngeal Swab  Result Value Ref Range Status   SARS Coronavirus 2 NEGATIVE NEGATIVE Final    Comment: (NOTE) SARS-CoV-2 target nucleic acids are NOT DETECTED. The SARS-CoV-2 RNA is generally detectable in upper and lower respiratory specimens during the acute phase of infection. Negative results do not preclude SARS-CoV-2 infection, do not rule out co-infections with other pathogens, and should not be used as the sole basis for treatment or other patient management decisions. Negative results must be combined with clinical observations, patient history, and epidemiological information. The expected result is Negative. Fact Sheet for Patients: SugarRoll.be Fact Sheet for Healthcare Providers: https://www.woods-mathews.com/ This test is not yet approved or cleared by the [place] FDA and  has been authorized for detection and/or diagnosis of SARS-CoV-2 by FDA under an Emergency Use Authorization (EUA). This EUA will remain  in effect (meaning this test can be used) for the duration of the COVID-19 declaration under Section 56 4(b)(1) of the Act, 21 U.S.C. section 360bbb-3(b)(1), unless the authorization is terminated or revoked sooner. Performed at [facility], [place] [address] [place], [place] [address]   Culture, blood (routine x 2)     Status: None (Preliminary result)   Collection Time: 05/05/19 11:25 AM   Specimen: BLOOD  Result Value Ref Range Status   Specimen Description BLOOD RIGHT ANTECUBITAL  Final   Special Requests   Final    BOTTLES DRAWN AEROBIC AND ANAEROBIC Blood Culture adequate volume Performed at [facility], [address] [place], [place] [address]    Culture PENDING   Incomplete   Report Status PENDING  Incomplete     Time coordinating discharge: 35 minutes  SIGNED:   Rodena Goldmann, DO [facility] 05/06/2019, 9:57 AM  If 7PM-7AM, please contact night-coverage [email or web address]

## 2019-05-07 LAB — CULTURE, BLOOD (ROUTINE X 2): Special Requests: ADEQUATE

## 2019-05-07 LAB — URINE CULTURE: Culture: >=100000 — AB

## 2019-05-09 LAB — CULTURE, BLOOD (ROUTINE X 2)
Culture: NO GROWTH
Special Requests: ADEQUATE

## 2019-05-10 LAB — CULTURE, BLOOD (ROUTINE X 2)
Culture: NO GROWTH
Culture: NO GROWTH
Special Requests: ADEQUATE
Special Requests: NORMAL

## 2019-09-04 ENCOUNTER — Other Ambulatory Visit (HOSPITAL_COMMUNITY)
Admission: RE | Admit: 2019-09-04 | Discharge: 2019-09-04 | Disposition: A | Discharge status: Home / Self Care | Payer: Medicaid Other | Source: Ambulatory Visit | Attending: Adult Health | Admitting: Adult Health

## 2019-09-04 ENCOUNTER — Encounter: Payer: Self-pay | Admitting: Adult Health

## 2019-09-04 ENCOUNTER — Ambulatory Visit (INDEPENDENT_AMBULATORY_CARE_PROVIDER_SITE_OTHER): Payer: Self-pay | Admitting: Adult Health

## 2019-09-04 VITALS — BP 124/88 | HR 80 | Ht 64.0 in | Wt 164.0 lb

## 2019-09-04 DIAGNOSIS — N898 Other specified noninflammatory disorders of vagina: Secondary | ICD-10-CM | POA: Diagnosis present

## 2019-09-04 DIAGNOSIS — N888 Other specified noninflammatory disorders of cervix uteri: Secondary | ICD-10-CM | POA: Insufficient documentation

## 2019-09-04 DIAGNOSIS — Z113 Encounter for screening for infections with a predominantly sexual mode of transmission: Secondary | ICD-10-CM

## 2019-09-04 DIAGNOSIS — Z3202 Encounter for pregnancy test, result negative: Secondary | ICD-10-CM

## 2019-09-04 LAB — POCT URINALYSIS DIPSTICK
Blood, UA: NEGATIVE
Glucose, UA: NEGATIVE
Leukocytes, UA: NEGATIVE
Nitrite, UA: NEGATIVE
Protein, UA: NEGATIVE

## 2019-09-04 LAB — POCT URINE PREGNANCY: Preg Test, Ur: NEGATIVE

## 2019-09-04 MED ORDER — METRONIDAZOLE 0.75 % VA GEL: 1.0000 | Freq: Every day | VAGINAL | 0 refills | Status: DC

## 2019-09-04 NOTE — Progress Notes (Signed)
  Subjective:     Patient ID: [name] Brown, female   DOB: 1990/05/17, 29 y.o.   MRN: [id number]  HPI [name] Brown is a 29 year old white female,single,G1P1 in complaining of vaginal discharge with odor, has itching and took diflucan and itching is gone. She before last period had pain and something felt like it popped and had odor then. PCP is Robbie Lis   Review of Systems  +vaginal discharge with odor Had itching that resolved after diflucan  Had pain before last period  Reviewed past medical,surgical, social and family history. Reviewed medications and allergies.     Objective:   Physical Exam BP 124/88 (BP Location: Left Arm, Patient Position: Sitting, Cuff Size: Normal)   Pulse 80   Ht 5\' 4"  (1.626 m)   Wt 164 lb (74.4 kg)   LMP 08/13/2019   BMI 28.15 kg/m UPT is negative, urine dipstick is negative Skin warm and dry.Pelvic: external genitalia is normal in appearance no lesions, vagina: white discharge with slight odor,urethra has no lesions or masses noted, cervix:smooth and bulbous and friable with Q tip, CV swab obtained, uterus: normal size, shape and contour, non tender, no masses felt, adnexa: no masses or tenderness noted. Bladder is non tender and no masses felt. CV swab obtained for GC/CHL,trich,BV and yeast Examination chaperoned by 08/15/2019 LPN     Upstream - 09/04/19 1227      Pregnancy Intention Screening   Does the patient want to become pregnant in the next year? Yes    Does the patient's partner want to become pregnant in the next year? Yes    Would the patient like to discuss contraceptive options today? No      Contraception Wrap Up   Current Method Pregnant/Seeking Pregnancy    End Method Pregnant/Seeking Pregnancy    Contraception Counseling Provided No          Assessment:     1. Vaginal discharge CV swab sent   2. Vaginal odor CV swab sent  3. Friable cervix Will rx Metrogel No sex during treatment  Meds ordered this encounter   Medications  . metroNIDAZOLE (METROGEL VAGINAL) 0.75 % vaginal gel    Sig: Place 1 Applicatorful vaginally at bedtime.    Dispense:  70 g    Refill:  0    Order Specific Question:   Supervising Provider    Answer:   EURE, LUTHER H [[id number]]    4. Screening examination for STD (sexually transmitted disease) Check HIV,RPR and hepatitis C antibody     Plan:     Follow up prn

## 2019-09-06 LAB — HIV ANTIBODY (ROUTINE TESTING W REFLEX): HIV Screen 4th Generation wRfx: NONREACTIVE

## 2019-09-06 LAB — HEPATITIS C ANTIBODY: Hep C Virus Ab: <0.1 s/co ratio (ref 0.0–0.9)

## 2019-09-06 LAB — RPR: RPR Ser Ql: NONREACTIVE

## 2019-09-08 LAB — CERVICOVAGINAL ANCILLARY ONLY
Bacterial Vaginitis (gardnerella): POSITIVE — AB
Candida Glabrata: NEGATIVE
Candida Vaginitis: NEGATIVE
Chlamydia: NEGATIVE
Comment: NEGATIVE
Comment: NEGATIVE
Comment: NEGATIVE
Comment: NEGATIVE
Comment: NEGATIVE
Comment: NORMAL
Neisseria Gonorrhea: NEGATIVE
Trichomonas: NEGATIVE

## 2019-09-12 ENCOUNTER — Telehealth: Payer: Self-pay | Admitting: Adult Health

## 2019-09-12 MED ORDER — FLUCONAZOLE 150 MG PO TABS: ORAL_TABLET | ORAL | 1 refills | Status: DC

## 2019-09-12 NOTE — Addendum Note (Signed)
Addended by: Cyril Mourning A on: 09/12/2019 12:40 PM   Modules accepted: Orders

## 2019-09-12 NOTE — Telephone Encounter (Signed)
Will rx diflucan  

## 2019-09-12 NOTE — Telephone Encounter (Signed)
Patient called stating that she was placed on a medication by Victorino Dike and now she states that the medication has caused her to have a yeast infection. Pt would like to know if Victorino Dike could call her in something for her yeast infection to the [facility] in [place]. Please contact pt

## 2019-09-23 ENCOUNTER — Other Ambulatory Visit (HOSPITAL_COMMUNITY)
Admission: RE | Admit: 2019-09-23 | Discharge: 2019-09-23 | Disposition: A | Discharge status: Home / Self Care | Payer: Medicaid Other | Source: Ambulatory Visit | Attending: Adult Health | Admitting: Adult Health

## 2019-09-23 ENCOUNTER — Ambulatory Visit (INDEPENDENT_AMBULATORY_CARE_PROVIDER_SITE_OTHER): Payer: Self-pay | Admitting: Adult Health

## 2019-09-23 ENCOUNTER — Encounter: Payer: Self-pay | Admitting: Adult Health

## 2019-09-23 VITALS — BP 122/81 | HR 76 | Ht 64.0 in | Wt 162.0 lb

## 2019-09-23 DIAGNOSIS — N898 Other specified noninflammatory disorders of vagina: Secondary | ICD-10-CM | POA: Insufficient documentation

## 2019-09-23 NOTE — Progress Notes (Signed)
°  Subjective:     Patient ID: [name] Brown, female   DOB: Jul 17, 1990, 29 y.o.   MRN: [id number]  HPI [name] Brown is a 29 year old white female,single,G1P1 in complaining of vaginal itching and creamy white vaginal discharge.She was treated for BV for Metrogel 09/04/19.She says she sweats at work. PCP is [place].  Review of Systems Has creamy white vaginal discharge +vaginal itching Denies any new partners or products  Reviewed past medical,surgical, social and family history. Reviewed medications and allergies.     Objective:   Physical Exam BP 122/81 (BP Location: Left Arm, Patient Position: Sitting, Cuff Size: Normal)    Pulse 76    Ht 5\' 4"  (1.626 m)    Wt 162 lb (73.5 kg)    LMP 09/13/2019 (Approximate)    BMI 27.81 kg/m   Skin warm and dry.Pelvic: external genitalia is normal in appearance no lesions, vagina: scant white discharge without odor,urethra has no lesions or masses noted, cervix:smooth, uterus: normal size, shape and contour, non tender, no masses felt, adnexa: no masses or tenderness noted. Bladder is non tender and no masses felt. CV swab obtained. Examination chaperoned by 09/15/2019 LPN Fall risk is low  Upstream - 09/23/19 1415      Pregnancy Intention Screening   Does the patient want to become pregnant in the next year? Yes    Does the patient's partner want to become pregnant in the next year? Yes    Would the patient like to discuss contraceptive options today? No      Contraception Wrap Up   Current Method No Method - Other Reason   wants to conceive   End Method No Method - Other Reason   wants to conceive   Contraception Counseling Provided No             Assessment:     1. Vaginal discharge CV swab sent   2. Vaginal itching CV swab sent   Will talk when results back   Plan:     Follow up prn

## 2019-09-25 LAB — CERVICOVAGINAL ANCILLARY ONLY
Bacterial Vaginitis (gardnerella): NEGATIVE
Candida Glabrata: NEGATIVE
Candida Vaginitis: NEGATIVE
Chlamydia: NEGATIVE
Comment: NEGATIVE
Comment: NEGATIVE
Comment: NEGATIVE
Comment: NEGATIVE
Comment: NEGATIVE
Comment: NORMAL
Neisseria Gonorrhea: NEGATIVE
Trichomonas: NEGATIVE

## 2019-10-08 ENCOUNTER — Ambulatory Visit
Admission: EM | Admit: 2019-10-08 | Discharge: 2019-10-08 | Disposition: A | Discharge status: Home / Self Care | Payer: Medicaid Other | Attending: Emergency Medicine | Admitting: Emergency Medicine

## 2019-10-08 DIAGNOSIS — R300 Dysuria: Secondary | ICD-10-CM | POA: Insufficient documentation

## 2019-10-08 DIAGNOSIS — R109 Unspecified abdominal pain: Secondary | ICD-10-CM | POA: Insufficient documentation

## 2019-10-08 LAB — POCT URINALYSIS DIP (MANUAL ENTRY)
Bilirubin, UA: NEGATIVE
Blood, UA: NEGATIVE
Glucose, UA: NEGATIVE mg/dL
Ketones, POC UA: NEGATIVE mg/dL
Nitrite, UA: NEGATIVE
Protein Ur, POC: 30 mg/dL — AB
Spec Grav, UA: >=1.03 — AB
Urobilinogen, UA: 0.2 U/dL
pH, UA: 5.5

## 2019-10-08 MED ORDER — PHENAZOPYRIDINE HCL 100 MG PO TABS: 100.0000 mg | ORAL_TABLET | Freq: Three times a day (TID) | ORAL | 0 refills | Status: DC | PRN

## 2019-10-08 MED ORDER — FLUCONAZOLE 150 MG PO TABS: 150.0000 mg | ORAL_TABLET | Freq: Once | ORAL | 0 refills | Status: AC

## 2019-10-08 NOTE — Discharge Instructions (Signed)
Urine culture sent.  We will call you with the results.   Push fluids and get plenty of rest.   Take pyridium as prescribed and as needed for symptomatic relief Follow up with PCP if symptoms persists Return here or go to ER if you have any new or worsening symptoms such as fever, worsening abdominal pain, nausea/vomiting, flank pain, etc... 

## 2019-10-08 NOTE — ED Provider Notes (Signed)
[facility]   CC: Burning with urination  SUBJECTIVE:  [name] Brown is a 29 y.o. female who presents to the urgent care with a complaint of dysuria for the past few days.  Patient denies a precipitating event, recent sexual encounter, excessive caffeine intake.  Localizes the pain to the lower right flank.  Pain is pain is intermittent described as achy.  Has tried OTC medications without relief.  Symptoms are made worse with urination.  Admits to similar symptoms in the past.  Denies fever, chills, nausea, vomiting, abdominal pain, abnormal vaginal discharge or bleeding, hematuria.    LMP: Patient's last menstrual period was 09/13/2019 (approximate).  ROS: As in HPI.  All other pertinent ROS negative.     Past Medical History:  Diagnosis Date   Chronic abdominal pain    Colitis    Counseling for birth control, oral contraceptives 06/26/2014   Medical history non-contributory    Missed period 06/26/2014   Past Surgical History:  Procedure Laterality Date   CESAREAN SECTION N/A 04/13/2017   Procedure: CESAREAN SECTION;  Surgeon: [place] Bing, MD;  Location: [facility];  Service: Obstetrics;  Laterality: N/A;   NO PAST SURGERIES     Allergies  Allergen Reactions   Mineral Oil Rash    Baby oil   No current facility-administered medications on file prior to encounter.   Current Outpatient Medications on File Prior to Encounter  Medication Sig Dispense Refill   [DISCONTINUED] escitalopram (LEXAPRO) 20 MG tablet Take 1 tablet (20 mg total) by mouth daily. (Patient not taking: Reported on 09/17/2018) 30 tablet 6   [DISCONTINUED] Norethindrone-Ethinyl Estradiol-Fe Biphas (LO LOESTRIN FE) 1 MG-10 MCG / 10 MCG tablet Take 1 tablet by mouth daily. (Patient not taking: Reported on 09/17/2018) 1 Package 11   Social History   Socioeconomic History   Marital status: Single    Spouse name: Not on file   Number of children: Not on file   Years of education:  Not on file   Highest education level: Not on file  Occupational History   Not on file  Tobacco Use   Smoking status: Never Smoker   Smokeless tobacco: Never Used  Vaping Use   Vaping Use: Never used  Substance and Sexual Activity   Alcohol use: No    Comment: occ   Drug use: No   Sexual activity: Yes    Birth control/protection: None  Other Topics Concern   Not on file  Social History Narrative   Not on file   Social Determinants of Health   Financial Resource Strain:    Difficulty of Paying Living Expenses: Not on file  Food Insecurity:    Worried About Running Out of Food in the Last Year: Not on file   [employer] of Food in the Last Year: Not on file  Transportation Needs:    Lack of Transportation (Medical): Not on file   Lack of Transportation (Non-Medical): Not on file  Physical Activity:    Days of Exercise per Week: Not on file   Minutes of Exercise per Session: Not on file  Stress:    Feeling of Stress : Not on file  Social Connections:    Frequency of Communication with Friends and Family: Not on file   Frequency of Social Gatherings with Friends and Family: Not on file   Attends Religious Services: Not on file   Active Member of Clubs or Organizations: Not on file   Attends Banker Meetings:  Not on file   Marital Status: Not on file  Intimate Partner Violence:    Fear of Current or Ex-Partner: Not on file   Emotionally Abused: Not on file   Physically Abused: Not on file   Sexually Abused: Not on file   Family History  Problem Relation Age of Onset   Cancer Mother        cervical   Alcohol abuse Mother    Other Father        fatty liver   Cancer Maternal Grandmother        skin   Dementia Maternal Grandmother    Dementia Paternal Grandmother    Diabetes Paternal Grandfather    Dementia Paternal Grandfather    Jaundice Daughter     OBJECTIVE:  Vitals:   10/08/19 1904  BP: 107/74  Pulse: 65    Resp: 16  Temp: 98.3 F (36.8 C)  TempSrc: Oral  SpO2: 99%   General appearance: AOx3 in no acute distress HEENT: NCAT.  Oropharynx clear.  Lungs: clear to auscultation bilaterally without adventitious breath sounds Heart: regular rate and rhythm.  Radial pulses 2+ symmetrical bilaterally Abdomen: soft; non-distended; no tenderness; bowel sounds present; no guarding or rebound tenderness Back: no CVA tenderness Extremities: no edema; symmetrical with no gross deformities Skin: warm and dry Neurologic: Ambulates from chair to exam table without difficulty Psychological: alert and cooperative; normal mood and affect  Labs Reviewed  POCT URINALYSIS DIP (MANUAL ENTRY) - Abnormal; Notable for the following components:      Result Value   Spec Grav, UA >=1.030 (*)    Protein Ur, POC =30 (*)    Leukocytes, UA Trace (*)    All other components within normal limits  URINE CULTURE    ASSESSMENT & PLAN:  1. Dysuria   2. Right flank pain     No orders of the defined types were placed in this encounter.  Discharge instructions  Urine culture sent.  We will call you with the results.   Push fluids and get plenty of rest.   Take pyridium as prescribed and as needed for symptomatic relief Follow up with PCP if symptoms persists Return here or go to ER if you have any new or worsening symptoms such as fever, worsening abdominal pain, nausea/vomiting, flank pain, etc...  Outlined signs and symptoms indicating need for more acute intervention. Patient verbalized understanding. After Visit Summary given.     Durward Parcel, FNP 10/08/19 2009

## 2019-10-08 NOTE — ED Triage Notes (Signed)
Pt presents with a c/o dysuria and flank pain and odorous urine for past month " smells like sweet corn"

## 2019-10-10 ENCOUNTER — Telehealth (HOSPITAL_COMMUNITY): Payer: Self-pay | Admitting: Emergency Medicine

## 2019-10-10 LAB — URINE CULTURE: Culture: >=100000 — AB

## 2019-10-10 MED ORDER — NITROFURANTOIN MONOHYD MACRO 100 MG PO CAPS: 100.0000 mg | ORAL_CAPSULE | Freq: Two times a day (BID) | ORAL | 0 refills | Status: DC

## 2019-11-13 ENCOUNTER — Telehealth: Payer: Self-pay | Admitting: Obstetrics & Gynecology

## 2019-11-13 NOTE — Telephone Encounter (Signed)
Pt called concerned with vaginal odor for 3 days & sharp/burning pain right breast 1 week No available appointments - scheduled patient for 11/20/2019 Please advise if patient needs to be in sooner

## 2019-11-20 ENCOUNTER — Ambulatory Visit: Payer: Medicaid Other | Admitting: Advanced Practice Midwife

## 2019-11-27 ENCOUNTER — Ambulatory Visit (INDEPENDENT_AMBULATORY_CARE_PROVIDER_SITE_OTHER): Payer: Self-pay | Admitting: Women's Health

## 2019-11-27 ENCOUNTER — Encounter: Payer: Self-pay | Admitting: Women's Health

## 2019-11-27 VITALS — BP 116/69 | HR 80 | Ht 64.0 in | Wt 164.0 lb

## 2019-11-27 DIAGNOSIS — Z3202 Encounter for pregnancy test, result negative: Secondary | ICD-10-CM

## 2019-11-27 DIAGNOSIS — N644 Mastodynia: Secondary | ICD-10-CM

## 2019-11-27 DIAGNOSIS — Z30011 Encounter for initial prescription of contraceptive pills: Secondary | ICD-10-CM

## 2019-11-27 MED ORDER — LO LOESTRIN FE 1 MG-10 MCG / 10 MCG PO TABS: 1.0000 | ORAL_TABLET | Freq: Every day | ORAL | 3 refills | Status: DC

## 2019-11-27 NOTE — Progress Notes (Signed)
GYN VISIT Patient name: [name] Brown MRN [id number]  Date of birth: Sep 18, 1990 Chief Complaint:   Breast Problem (Right breast pain, stabbing & burning)  History of Present Illness:   [name] Brown is a 29 y.o. G26P1001 Caucasian female being seen today for report of Rt breast pain- sharp, stabbing, burning at 9 o'clock Rt breast x a few weeks, stopped 1 week ago, none since.  Has been cutting back on caffeine. Had a 'slip-up' on 10/23, wants BHCG. Not on birth control, discussed options, wants LoLoestrin.     Depression screen [facility] 2/9 06/27/2017 09/20/2016 08/22/2016 04/20/2016  Decreased Interest 1 1 1 1   Down, Depressed, Hopeless 1 1 1 1   PHQ - 2 Score 2 2 2 2   Altered sleeping 0 0 1 -  Tired, decreased energy 1 1 1  -  Change in appetite 1 1 0 -  Feeling bad or failure about yourself  1 1 1  -  Trouble concentrating 1 0 0 -  Moving slowly or fidgety/restless 0 - 0 -  Suicidal thoughts 0 0 0 -  PHQ-9 Score 6 5 5  -  Difficult doing work/chores Not difficult at all Not difficult at all Not difficult at all -    Patient's last menstrual period was 11/02/2019 (approximate). The current method of family planning is none.  Last pap 09/20/16. Results were:  normal Review of Systems:   Pertinent items are noted in HPI Denies fever/chills, dizziness, headaches, visual disturbances, fatigue, shortness of breath, chest pain, abdominal pain, vomiting, abnormal vaginal discharge/itching/odor/irritation, problems with periods, bowel movements, urination, or intercourse unless otherwise stated above.  Pertinent History Reviewed:  Reviewed past medical,surgical, social, obstetrical and family history.  Reviewed problem list, medications and allergies. Physical Assessment:   Vitals:   11/27/19 1447  BP: 116/69  Pulse: 80  Weight: 164 lb (74.4 kg)  Height: 5\' 4"  (1.626 m)  Body mass index is 28.15 kg/m.       Physical Examination:   General appearance: alert, well appearing, and in no  distress  Mental status: alert, oriented to person, place, and time  Skin: warm & dry   Cardiovascular: normal heart rate noted  Respiratory: normal respiratory effort, no distress  Breasts - breasts appear normal, no suspicious masses, no skin or nipple changes or axillary nodes   Abdomen: soft, non-tender   Pelvic: examination not indicated  Extremities: no edema   Chaperone:    No results found for this or any previous visit (from the past 24 hour(s)).  Assessment & Plan:  1) Resolved Rt breast pain> normal breast exam, if pain returns let know, will get u/s  2) Recent unprotected sex> wants to check BHCG, will do today  3) Contraception management> rx LoLoestrin, 1 sample given  4) Needs pap> schedule today  Meds:  Meds ordered this encounter  Medications  . LO LOESTRIN FE 1 MG-10 MCG / 10 MCG tablet    Sig: Take 1 tablet by mouth daily.    Dispense:  90 tablet    Refill:  3    For co-pay card, pt to text "Lo Loestrin Fe " to [phone]              Co-pay card must be run in second position  "other coverage code 3"  if denied d/t PA, step edit, or insurance denial    Order Specific Question:   Supervising Provider    Answer:   01/02/2020 H [[id number]]  Orders Placed This Encounter  Procedures  . Beta hCG quant (ref lab)    Return for 1st available, Pap & physical.  Cheral Marker CNM, [facility] 11/27/2019 3:41 PM

## 2019-11-28 LAB — BETA HCG QUANT (REF LAB): hCG Quant: <1 m[IU]/mL

## 2019-12-11 ENCOUNTER — Telehealth: Payer: Self-pay | Admitting: *Deleted

## 2019-12-11 MED ORDER — FLUCONAZOLE 150 MG PO TABS: ORAL_TABLET | ORAL | 1 refills | Status: DC

## 2019-12-11 NOTE — Addendum Note (Signed)
Addended by: Cyril Mourning A on: 12/11/2019 03:55 PM   Modules accepted: Orders

## 2019-12-11 NOTE — Telephone Encounter (Signed)
Rx diflucan.  

## 2019-12-11 NOTE — Telephone Encounter (Signed)
Patient called requesting a prescription for Diflucan for a yeast infection.  Having internal itching, no odor and no new partners.  Advised to check back with pharmacy later today.

## 2019-12-19 ENCOUNTER — Other Ambulatory Visit: Payer: Self-pay

## 2019-12-19 ENCOUNTER — Ambulatory Visit
Admission: RE | Admit: 2019-12-19 | Discharge: 2019-12-19 | Disposition: A | Discharge status: Home / Self Care | Payer: Medicaid Other | Source: Ambulatory Visit | Attending: Family Medicine | Admitting: Family Medicine

## 2019-12-19 VITALS — BP 117/76 | HR 91 | Temp 98.9°F | Resp 18

## 2019-12-19 DIAGNOSIS — N898 Other specified noninflammatory disorders of vagina: Secondary | ICD-10-CM | POA: Insufficient documentation

## 2019-12-19 DIAGNOSIS — R300 Dysuria: Secondary | ICD-10-CM | POA: Insufficient documentation

## 2019-12-19 LAB — POCT URINALYSIS DIP (MANUAL ENTRY)
Bilirubin, UA: NEGATIVE
Blood, UA: NEGATIVE
Glucose, UA: NEGATIVE mg/dL
Ketones, POC UA: NEGATIVE mg/dL
Nitrite, UA: NEGATIVE
Protein Ur, POC: NEGATIVE mg/dL
Spec Grav, UA: 1.025 (ref 1.010–1.025)
Urobilinogen, UA: 0.2 E.U./dL
pH, UA: 6.5 (ref 5.0–8.0)

## 2019-12-19 NOTE — Discharge Instructions (Addendum)
Swab test will be back in about 3 days.  We will be in contact with any abnormal results that require further treatment.  You may also have a UTI.  We have cultured your urine to be sure.  We will also be in contact with abnormal results with this test that require further treatment.  Follow up with this office or with primary care if symptoms are persisting.  Follow up in the ER for high fever, trouble swallowing, trouble breathing, other concerning symptoms.'

## 2019-12-19 NOTE — ED Triage Notes (Addendum)
Pt is here with vaginal odor, itching, burning with frequency that started 10 days ago, pt has taken her last Diflucan meds to relieve discomfort. Pt has an appt with her OBGYN.

## 2019-12-19 NOTE — ED Provider Notes (Signed)
[facility]   CC: UTI  SUBJECTIVE:  [name] Brown is a 29 y.o. female who complains of urinary frequency, urgency and dysuria for the past 10 days.  Patient thinks that she has a UTI or BV, or yeast.  Reports that she has an appointment with her OB/GYN doctor in 5 days.  Has recurrent yeast.  Reports that she took last Diflucan yesterday and there has been no relief of symptoms.  Symptoms are made worse with urination. Admits to similar symptoms in the past.  Denies fever, chills, nausea, vomiting, abdominal pain, flank pain, abnormal vaginal bleeding, hematuria.    LMP: Patient's last menstrual period was 12/03/2019.  ROS: As in HPI.  All other pertinent ROS negative.     Past Medical History:  Diagnosis Date  . Chronic abdominal pain   . Colitis   . Counseling for birth control, oral contraceptives 06/26/2014  . Medical history non-contributory   . Missed period 06/26/2014   Past Surgical History:  Procedure Laterality Date  . CESAREAN SECTION N/A 04/13/2017   Procedure: CESAREAN SECTION;  Surgeon: [place] Bing, MD;  Location: [facility];  Service: Obstetrics;  Laterality: N/A;  . NO PAST SURGERIES     Allergies  Allergen Reactions  . Mineral Oil Rash    Baby oil   No current facility-administered medications on file prior to encounter.   Current Outpatient Medications on File Prior to Encounter  Medication Sig Dispense Refill  . fluconazole (DIFLUCAN) 150 MG tablet Take 1 now and 1 in 3 days 2 tablet 1  . LO LOESTRIN FE 1 MG-10 MCG / 10 MCG tablet Take 1 tablet by mouth daily. 90 tablet 3  . [DISCONTINUED] escitalopram (LEXAPRO) 20 MG tablet Take 1 tablet (20 mg total) by mouth daily. (Patient not taking: Reported on 09/17/2018) 30 tablet 6   Social History   Socioeconomic History  . Marital status: Single    Spouse name: Not on file  . Number of children: 1  . Years of education: Not on file  . Highest education level: Not on file  Occupational  History  . Not on file  Tobacco Use  . Smoking status: Never Smoker  . Smokeless tobacco: Never Used  Vaping Use  . Vaping Use: Never used  Substance and Sexual Activity  . Alcohol use: Yes  . Drug use: No  . Sexual activity: Yes    Birth control/protection: None, Pill  Other Topics Concern  . Not on file  Social History Narrative  . Not on file   Social Determinants of Health   Financial Resource Strain:   . Difficulty of Paying Living Expenses: Not on file  Food Insecurity:   . Worried About Programme researcher, broadcasting/film/video in the Last Year: Not on file  . Ran Out of Food in the Last Year: Not on file  Transportation Needs:   . Lack of Transportation (Medical): Not on file  . Lack of Transportation (Non-Medical): Not on file  Physical Activity:   . Days of Exercise per Week: Not on file  . Minutes of Exercise per Session: Not on file  Stress:   . Feeling of Stress : Not on file  Social Connections:   . Frequency of Communication with Friends and Family: Not on file  . Frequency of Social Gatherings with Friends and Family: Not on file  . Attends Religious Services: Not on file  . Active Member of Clubs or Organizations: Not on file  . Attends  Club or Organization Meetings: Not on file  . Marital Status: Not on file  Intimate Partner Violence:   . Fear of Current or Ex-Partner: Not on file  . Emotionally Abused: Not on file  . Physically Abused: Not on file  . Sexually Abused: Not on file   Family History  Problem Relation Age of Onset  . Cancer Mother        cervical  . Alcohol abuse Mother   . Other Father        fatty liver  . Cancer Maternal Grandmother        skin  . Dementia Maternal Grandmother   . Dementia Paternal Grandmother   . Diabetes Paternal Grandfather   . Dementia Paternal Grandfather   . Jaundice Daughter     OBJECTIVE:  Vitals:   12/19/19 1758  BP: 117/76  Pulse: 91  Resp: 18  Temp: 98.9 F (37.2 C)  TempSrc: Oral  SpO2: 98%   General  appearance: AOx3 in no acute distress HEENT: NCAT. Oropharynx clear.  Lungs: clear to auscultation bilaterally without adventitious breath sounds Heart: regular rate and rhythm. Radial pulses 2+ symmetrical bilaterally Abdomen: soft; non-distended; no tenderness; bowel sounds present; no guarding or rebound tenderness Back: no CVA tenderness Extremities: no edema; symmetrical with no gross deformities Skin: warm and dry GU: deferred Neurologic: Ambulates from chair to exam table without difficulty Psychological: alert and cooperative; normal mood and affect  Labs Reviewed  POCT URINALYSIS DIP (MANUAL ENTRY) - Abnormal; Notable for the following components:      Result Value   Leukocytes, UA Small (1+) (*)    All other components within normal limits  URINE CULTURE  CERVICOVAGINAL ANCILLARY ONLY    ASSESSMENT & PLAN:  1. Dysuria   2. Vaginal discharge    Cytoswab sent Declines HIV and syphilis testing today Urine culture sent  We will call you with abnormal results that need further treatment Push fluids and get plenty of rest Take pyridium as needed and as needed for symptomatic relief Follow up with PCP if symptoms persists Return here or go to ER if you have any new or worsening symptoms such as fever, worsening abdominal pain, nausea/vomiting, flank pain  Outlined signs and symptoms indicating need for more acute intervention Patient verbalized understanding After Visit Summary given     Moshe Cipro, NP 12/19/19 1922

## 2019-12-20 ENCOUNTER — Emergency Department (HOSPITAL_COMMUNITY)
Admission: EM | Admit: 2019-12-20 | Discharge: 2019-12-21 | Disposition: A | Discharge status: Home / Self Care | Payer: Self-pay | Attending: Emergency Medicine | Admitting: Emergency Medicine

## 2019-12-20 ENCOUNTER — Other Ambulatory Visit: Payer: Self-pay

## 2019-12-20 ENCOUNTER — Encounter (HOSPITAL_COMMUNITY): Payer: Self-pay | Admitting: *Deleted

## 2019-12-20 DIAGNOSIS — R109 Unspecified abdominal pain: Secondary | ICD-10-CM | POA: Insufficient documentation

## 2019-12-20 DIAGNOSIS — M545 Low back pain, unspecified: Secondary | ICD-10-CM | POA: Insufficient documentation

## 2019-12-20 DIAGNOSIS — L539 Erythematous condition, unspecified: Secondary | ICD-10-CM | POA: Insufficient documentation

## 2019-12-20 DIAGNOSIS — R309 Painful micturition, unspecified: Secondary | ICD-10-CM | POA: Insufficient documentation

## 2019-12-20 DIAGNOSIS — J029 Acute pharyngitis, unspecified: Secondary | ICD-10-CM | POA: Insufficient documentation

## 2019-12-20 NOTE — ED Triage Notes (Signed)
Right flank pain this morning with burning with urination.  Pt sore throat since yesterday.  Lower to mid back pain since yesterday.  + nausea today around 1500.

## 2019-12-21 ENCOUNTER — Emergency Department (HOSPITAL_COMMUNITY): Payer: Self-pay

## 2019-12-21 LAB — CBC WITH DIFFERENTIAL/PLATELET
Abs Immature Granulocytes: 0.14 10*3/uL — ABNORMAL HIGH (ref 0.00–0.07)
Basophils Absolute: 0 10*3/uL (ref 0.0–0.1)
Basophils Relative: 0 %
Eosinophils Absolute: 0 10*3/uL (ref 0.0–0.5)
Eosinophils Relative: 0 %
HCT: 37 % (ref 36.0–46.0)
Hemoglobin: 12.4 g/dL (ref 12.0–15.0)
Immature Granulocytes: 1 %
Lymphocytes Relative: 8 %
Lymphs Abs: 1.5 10*3/uL (ref 0.7–4.0)
MCH: 31.6 pg (ref 26.0–34.0)
MCHC: 33.5 g/dL (ref 30.0–36.0)
MCV: 94.1 fL (ref 80.0–100.0)
Monocytes Absolute: 1.4 10*3/uL — ABNORMAL HIGH (ref 0.1–1.0)
Monocytes Relative: 7 %
Neutro Abs: 15.3 10*3/uL — ABNORMAL HIGH (ref 1.7–7.7)
Neutrophils Relative %: 84 %
Platelets: 249 10*3/uL (ref 150–400)
RBC: 3.93 MIL/uL (ref 3.87–5.11)
RDW: 12.7 % (ref 11.5–15.5)
WBC: 18.4 10*3/uL — ABNORMAL HIGH (ref 4.0–10.5)
nRBC: 0 % (ref 0.0–0.2)

## 2019-12-21 LAB — URINALYSIS, ROUTINE W REFLEX MICROSCOPIC
Bacteria, UA: NONE SEEN
Bilirubin Urine: NEGATIVE
Glucose, UA: NEGATIVE mg/dL
Ketones, ur: NEGATIVE mg/dL
Nitrite: NEGATIVE
Protein, ur: NEGATIVE mg/dL
Specific Gravity, Urine: 1.014 (ref 1.005–1.030)
pH: 6 (ref 5.0–8.0)

## 2019-12-21 LAB — BASIC METABOLIC PANEL
Anion gap: 7 (ref 5–15)
BUN: 6 mg/dL (ref 6–20)
CO2: 22 mmol/L (ref 22–32)
Calcium: 8.8 mg/dL — ABNORMAL LOW (ref 8.9–10.3)
Chloride: 103 mmol/L (ref 98–111)
Creatinine, Ser: 0.6 mg/dL (ref 0.44–1.00)
GFR, Estimated: >60 mL/min (ref >60)
Glucose, Bld: 99 mg/dL (ref 70–99)
Potassium: 3.6 mmol/L (ref 3.5–5.1)
Sodium: 132 mmol/L — ABNORMAL LOW (ref 135–145)

## 2019-12-21 LAB — URINE CULTURE

## 2019-12-21 LAB — GROUP A STREP BY PCR: Group A Strep by PCR: NOT DETECTED

## 2019-12-21 LAB — LACTIC ACID, PLASMA: Lactic Acid, Venous: 0.8 mmol/L (ref 0.5–1.9)

## 2019-12-21 LAB — PREGNANCY, URINE: Preg Test, Ur: NEGATIVE

## 2019-12-21 MED ORDER — KETOROLAC TROMETHAMINE 30 MG/ML IJ SOLN
30.0000 mg | Freq: Once | INTRAMUSCULAR | Status: AC
  Administered 2019-12-21: 30 mg via INTRAVENOUS
  Filled 2019-12-21: qty 1

## 2019-12-21 MED ORDER — CYCLOBENZAPRINE HCL 10 MG PO TABS: 10.0000 mg | ORAL_TABLET | Freq: Two times a day (BID) | ORAL | 0 refills | Status: DC | PRN

## 2019-12-21 MED ORDER — DEXAMETHASONE SODIUM PHOSPHATE 10 MG/ML IJ SOLN
10.0000 mg | Freq: Once | INTRAMUSCULAR | Status: AC
  Administered 2019-12-21: 10 mg via INTRAMUSCULAR
  Filled 2019-12-21: qty 1

## 2019-12-21 MED ORDER — KETOROLAC TROMETHAMINE 60 MG/2ML IM SOLN: 60.0000 mg | Freq: Once | INTRAMUSCULAR | Status: DC

## 2019-12-21 MED ORDER — SODIUM CHLORIDE 0.9 % IV SOLN: Freq: Once | INTRAVENOUS | Status: AC

## 2019-12-21 MED ORDER — IBUPROFEN 800 MG PO TABS: 800.0000 mg | ORAL_TABLET | Freq: Four times a day (QID) | ORAL | 0 refills | Status: DC | PRN

## 2019-12-21 NOTE — ED Provider Notes (Signed)
[facility] EMERGENCY DEPARTMENT Provider Note   CSN: [id number] Arrival date & time: 12/20/19  2248     History Chief Complaint  Patient presents with  . Flank Pain    [name] Brown is a 29 y.o. female.  Patient presents to the emergency department for multiple complaints.  Patient reports he started having a slight sore throat yesterday and today has noticed increased throat pain, especially with swallowing.  Patient also here with complaints of right lower back pain.  Patient reports burning with urination as well.  She has had nausea but no vomiting.        Past Medical History:  Diagnosis Date  . Chronic abdominal pain   . Colitis   . Counseling for birth control, oral contraceptives 06/26/2014  . Medical history non-contributory   . Missed period 06/26/2014    Patient Active Problem List   Diagnosis Date Noted  . Friable cervix 09/04/2019  . Bacteremia due to Escherichia coli 05/05/2019  . Nexplanon in place 06/27/2017  . 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  . Nexplanon insertion 06/19/2017  . 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/2017  . Status post primary low transverse cesarean section 04/14/2017  . BV (bacterial vaginosis) 09/11/2016  . Abdominal pain 04/22/2014    Past Surgical History:  Procedure Laterality Date  . CESAREAN SECTION N/A 04/13/2017   Procedure: CESAREAN SECTION;  Surgeon: [place] Bing, MD;  Location: [facility] BIRTHING SUITES;  Service: Obstetrics;  Laterality: N/A;  . NO PAST SURGERIES       OB History    Gravida  1   Para  1   Term  1   Preterm  0   AB  0   Living  1     SAB  0   TAB  0   Ectopic  0   Multiple  0   Live Births  1           Family History  Problem Relation Age of Onset  . Cancer Mother        cervical  . Alcohol abuse Mother   . Other Father        fatty liver  . Cancer Maternal Grandmother        skin  . Dementia Maternal Grandmother   . Dementia Paternal Grandmother   . Diabetes Paternal  Grandfather   . Dementia Paternal Grandfather   . Jaundice Daughter     Social History   Tobacco Use  . Smoking status: Never Smoker  . Smokeless tobacco: Never Used  Vaping Use  . Vaping Use: Never used  Substance Use Topics  . Alcohol use: Yes  . Drug use: No    Home Medications Prior to Admission medications   Medication Sig Start Date End Date Taking? Authorizing Provider  cyclobenzaprine (FLEXERIL) 10 MG tablet Take 1 tablet (10 mg total) by mouth 2 (two) times daily as needed for muscle spasms. 12/21/19   Gilda Crease, MD  fluconazole (DIFLUCAN) 150 MG tablet Take 1 now and 1 in 3 days 12/11/19   Cyril Mourning A, NP  ibuprofen (ADVIL) 800 MG tablet Take 1 tablet (800 mg total) by mouth every 6 (six) hours as needed for moderate pain. 12/21/19   Inis Borneman, Canary Brim, MD  LO LOESTRIN FE 1 MG-10 MCG / 10 MCG tablet Take 1 tablet by mouth daily. 11/27/19   Cheral Marker, CNM  escitalopram (LEXAPRO) 20 MG tablet Take 1 tablet (20 mg total) by mouth daily. Patient not taking: Reported  on 09/17/2018 06/27/17 03/30/19  Adline PotterGriffin, Jennifer A, NP    Allergies    Mineral oil  Review of Systems   Review of Systems  HENT: Positive for sore throat.   Genitourinary: Positive for dysuria and flank pain.  All other systems reviewed and are negative.   Physical Exam Updated Vital Signs BP (!) 92/54 (BP Location: Right Arm)   Pulse 100   Temp 97.9 F (36.6 C) (Oral)   Resp 16   Ht 5\' 4"  (1.626 m)   Wt 74.4 kg   LMP 12/03/2019   SpO2 98%   BMI 28.15 kg/m   Physical Exam Vitals and nursing note reviewed.  Constitutional:      General: She is not in acute distress.    Appearance: Normal appearance. She is well-developed.  HENT:     Head: Normocephalic and atraumatic.     Right Ear: Hearing normal.     Left Ear: Hearing normal.     Nose: Nose normal.     Mouth/Throat:     Pharynx: Posterior oropharyngeal erythema present. No oropharyngeal exudate.  Eyes:      Conjunctiva/sclera: Conjunctivae normal.     Pupils: Pupils are equal, round, and reactive to light.  Cardiovascular:     Rate and Rhythm: Regular rhythm.     Heart sounds: S1 normal and S2 normal. No murmur heard.  No friction rub. No gallop.   Pulmonary:     Effort: Pulmonary effort is normal. No respiratory distress.     Breath sounds: Normal breath sounds.  Chest:     Chest wall: No tenderness.  Abdominal:     General: Bowel sounds are normal.     Palpations: Abdomen is soft.     Tenderness: There is no abdominal tenderness. There is no guarding or rebound. Negative signs include Murphy's sign and McBurney's sign.     Hernia: No hernia is present.  Musculoskeletal:        General: Normal range of motion.     Cervical back: Normal range of motion and neck supple.  Skin:    General: Skin is warm and dry.     Findings: No rash.  Neurological:     Mental Status: She is alert and oriented to person, place, and time.     GCS: GCS eye subscore is 4. GCS verbal subscore is 5. GCS motor subscore is 6.     Cranial Nerves: No cranial nerve deficit.     Sensory: No sensory deficit.     Coordination: Coordination normal.  Psychiatric:        Speech: Speech normal.        Behavior: Behavior normal.        Thought Content: Thought content normal.     ED Results / Procedures / Treatments   Labs (all labs ordered are listed, but only abnormal results are displayed) Labs Reviewed  URINALYSIS, ROUTINE W REFLEX MICROSCOPIC - Abnormal; Notable for the following components:      Result Value   APPearance HAZY (*)    Hgb urine dipstick SMALL (*)    Leukocytes,Ua MODERATE (*)    All other components within normal limits  CBC WITH DIFFERENTIAL/PLATELET - Abnormal; Notable for the following components:   WBC 18.4 (*)    Neutro Abs 15.3 (*)    Monocytes Absolute 1.4 (*)    Abs Immature Granulocytes 0.14 (*)    All other components within normal limits  BASIC METABOLIC PANEL - Abnormal;  Notable for the  following components:   Sodium 132 (*)    Calcium 8.8 (*)    All other components within normal limits  GROUP A STREP BY PCR  URINE CULTURE  PREGNANCY, URINE  LACTIC ACID, PLASMA    EKG None  Radiology DG Chest Portable 1 View  Result Date: 12/21/2019 CLINICAL DATA:  Right-sided pain. Burning with urination. Sore throat. Low to mid back pain. Nausea. EXAM: PORTABLE CHEST 1 VIEW COMPARISON:  09/05/2017 FINDINGS: The heart size and mediastinal contours are within normal limits. Both lungs are clear. The visualized skeletal structures are unremarkable. IMPRESSION: No active disease. Electronically Signed   By: Burman Nieves M.D.   On: 12/21/2019 06:06   CT RENAL STONE STUDY  Result Date: 12/21/2019 CLINICAL DATA:  Right flank pain with burning urination.  Nausea. EXAM: CT ABDOMEN AND PELVIS WITHOUT CONTRAST TECHNIQUE: Multidetector CT imaging of the abdomen and pelvis was performed following the standard protocol without IV contrast. COMPARISON:  05/04/2019 FINDINGS: Lower chest: The lung bases are clear. Hepatobiliary: No focal liver abnormality is seen. No gallstones, gallbladder wall thickening, or biliary dilatation. Pancreas: Unremarkable. No pancreatic ductal dilatation or surrounding inflammatory changes. Spleen: Normal in size without focal abnormality. Adrenals/Urinary Tract: Adrenal glands are unremarkable. Kidneys are normal, without renal calculi, focal lesion, or hydronephrosis. Bladder is unremarkable. Stomach/Bowel: Stomach is within normal limits. Appendix appears normal. No evidence of bowel wall thickening, distention, or inflammatory changes. Vascular/Lymphatic: No significant vascular findings are present. No enlarged abdominal or pelvic lymph nodes. Reproductive: Uterus and bilateral adnexa are unremarkable. Other: No abdominal wall hernia or abnormality. No abdominopelvic ascites. Musculoskeletal: No acute or significant osseous findings. IMPRESSION: No  acute process demonstrated in the abdomen or pelvis. No renal or ureteral stone or obstruction. Electronically Signed   By: Burman Nieves M.D.   On: 12/21/2019 05:09    Procedures Procedures (including critical care time)  Medications Ordered in ED Medications  dexamethasone (DECADRON) injection 10 mg (10 mg Intramuscular Given 12/21/19 0551)  0.9 %  sodium chloride infusion ( Intravenous Stopped 12/21/19 0637)  ketorolac (TORADOL) 30 MG/ML injection 30 mg (30 mg Intravenous Given 12/21/19 3710)    ED Course  I have reviewed the triage vital signs and the nursing notes.  Pertinent labs & imaging results that were available during my care of the patient were reviewed by me and considered in my medical decision making (see chart for details).    MDM Rules/Calculators/A&P                          Patient presents to the emergency department with multiple complaints. Patient reports sore throat for 1-1/2 days. Examination reveals erythema and slight swelling of the soft palate but no exudate. No sign of abscess. Strep negative. Lungs are clear, remainder of examination unremarkable other than right-sided back pain. Etiology of this is likely musculoskeletal. Urinalysis does not suggest infection. CT renal stone study does not show any pathology.  Chemistries and renal function are normal. She does have a leukocytosis, unclear etiology. No sign of bacterial infection on examination and work-up.  No concern for sepsis.  Final Clinical Impression(s) / ED Diagnoses Final diagnoses:  Right flank pain  Pharyngitis, unspecified etiology    Rx / DC Orders ED Discharge Orders         Ordered    ibuprofen (ADVIL) 800 MG tablet  Every 6 hours PRN        12/21/19 0523  cyclobenzaprine (FLEXERIL) 10 MG tablet  2 times daily PRN        12/21/19 0523           Gilda Crease, MD 12/21/19 [phone]

## 2019-12-21 NOTE — ED Notes (Signed)
Pt returned from CT Scan 

## 2019-12-22 LAB — URINE CULTURE: Culture: NO GROWTH

## 2019-12-23 ENCOUNTER — Telehealth (HOSPITAL_COMMUNITY): Payer: Self-pay | Admitting: Emergency Medicine

## 2019-12-23 LAB — CERVICOVAGINAL ANCILLARY ONLY
Bacterial Vaginitis (gardnerella): POSITIVE — AB
Chlamydia: NEGATIVE
Comment: NEGATIVE
Comment: NEGATIVE
Comment: NORMAL
Neisseria Gonorrhea: NEGATIVE

## 2019-12-23 MED ORDER — METRONIDAZOLE 500 MG PO TABS: 500.0000 mg | ORAL_TABLET | Freq: Two times a day (BID) | ORAL | 0 refills | Status: DC

## 2019-12-24 ENCOUNTER — Ambulatory Visit: Payer: Medicaid Other | Admitting: Advanced Practice Midwife

## 2019-12-29 ENCOUNTER — Telehealth: Payer: Self-pay | Admitting: Women's Health

## 2019-12-29 ENCOUNTER — Other Ambulatory Visit: Payer: Self-pay | Admitting: Women's Health

## 2019-12-29 MED ORDER — LO LOESTRIN FE 1 MG-10 MCG / 10 MCG PO TABS: 1.0000 | ORAL_TABLET | Freq: Every day | ORAL | 3 refills | Status: DC

## 2019-12-29 NOTE — Telephone Encounter (Signed)
Pt requesting we send her [facility] Rx to [facility]  (the other place don't have her insurance on file)  Also would like to know cost of skin tag removal, pt is self pay  Please advise & notify pt

## 2019-12-29 NOTE — Telephone Encounter (Signed)
LMOVM Bc was sent to pharmacy per request and skin tag removal is $78.75.

## 2019-12-30 ENCOUNTER — Other Ambulatory Visit: Payer: Medicaid Other | Admitting: Women's Health

## 2020-01-02 ENCOUNTER — Telehealth: Payer: Self-pay | Admitting: Adult Health

## 2020-01-02 MED ORDER — FLUCONAZOLE 150 MG PO TABS: ORAL_TABLET | ORAL | 1 refills | Status: DC

## 2020-01-02 NOTE — Telephone Encounter (Signed)
Pt called back to request this be done ASAP, she's leaving [place] today at 3:00pm

## 2020-01-02 NOTE — Telephone Encounter (Signed)
Pt requesting Rx for a yeast infection, states she just finished Flagyl & it caused a yeast infection    Please advise & notify pt    [facility] - [address]/[place]

## 2020-01-02 NOTE — Telephone Encounter (Signed)
Will rx diflucan  

## 2020-01-19 ENCOUNTER — Ambulatory Visit: Payer: Medicaid Other | Admitting: Obstetrics & Gynecology

## 2020-01-22 ENCOUNTER — Ambulatory Visit: Payer: Medicaid Other | Admitting: Obstetrics & Gynecology

## 2020-01-27 ENCOUNTER — Ambulatory Visit: Admit: 2020-01-27 | Discharge status: Against Medical Advice | Payer: MEDICAID

## 2020-02-02 ENCOUNTER — Ambulatory Visit: Payer: Medicaid Other | Admitting: Obstetrics & Gynecology

## 2020-02-17 ENCOUNTER — Ambulatory Visit: Payer: Medicaid Other | Admitting: Obstetrics & Gynecology

## 2020-03-04 ENCOUNTER — Telehealth: Payer: Self-pay | Admitting: Adult Health

## 2020-03-04 MED ORDER — METRONIDAZOLE 500 MG PO TABS: 500.0000 mg | ORAL_TABLET | Freq: Two times a day (BID) | ORAL | 0 refills | Status: DC

## 2020-03-04 NOTE — Telephone Encounter (Signed)
Saree says has BV, will rx flagyl to [facility], and she will make appt to discuss getting on meds for anxiety

## 2020-03-04 NOTE — Telephone Encounter (Signed)
Possible BV, no avail nurse visits or avail appt with Victorino Dike Please advise

## 2020-03-31 ENCOUNTER — Other Ambulatory Visit: Payer: Self-pay

## 2020-03-31 ENCOUNTER — Ambulatory Visit
Admission: EM | Admit: 2020-03-31 | Discharge: 2020-03-31 | Disposition: A | Discharge status: Home / Self Care | Payer: Medicaid Other | Attending: Emergency Medicine | Admitting: Emergency Medicine

## 2020-03-31 ENCOUNTER — Encounter: Payer: Self-pay | Admitting: Emergency Medicine

## 2020-03-31 DIAGNOSIS — B351 Tinea unguium: Secondary | ICD-10-CM

## 2020-03-31 DIAGNOSIS — R350 Frequency of micturition: Secondary | ICD-10-CM

## 2020-03-31 DIAGNOSIS — R110 Nausea: Secondary | ICD-10-CM

## 2020-03-31 LAB — POCT URINALYSIS DIP (MANUAL ENTRY)
Bilirubin, UA: NEGATIVE
Blood, UA: NEGATIVE
Glucose, UA: NEGATIVE mg/dL
Ketones, POC UA: NEGATIVE mg/dL
Nitrite, UA: NEGATIVE
Protein Ur, POC: NEGATIVE mg/dL
Spec Grav, UA: 1.015 (ref 1.010–1.025)
Urobilinogen, UA: 0.2 E.U./dL
pH, UA: 7 (ref 5.0–8.0)

## 2020-03-31 LAB — POCT URINE PREGNANCY: Preg Test, Ur: NEGATIVE

## 2020-03-31 MED ORDER — PHENAZOPYRIDINE HCL 200 MG PO TABS: 200.0000 mg | ORAL_TABLET | Freq: Three times a day (TID) | ORAL | 0 refills | Status: DC

## 2020-03-31 MED ORDER — NITROFURANTOIN MONOHYD MACRO 100 MG PO CAPS: 100.0000 mg | ORAL_CAPSULE | Freq: Two times a day (BID) | ORAL | 0 refills | Status: DC

## 2020-03-31 NOTE — ED Triage Notes (Signed)
Frequent urination x 1 week and nausea that started today.  And poss fungus on LT great toe.  Pt reports she had rx toenail polish but lost it.

## 2020-03-31 NOTE — ED Provider Notes (Signed)
[facility]   CC: Burning with urination; toenail fungus  SUBJECTIVE:  [name] Brown is a 30 y.o. female who complains of urinary frequency and nausea x 1 day.  Patient admits to excessive caffeine intake. Has NOT tried OTC medication. Symptoms are made worse with urination.  Admits to similar symptoms in the past.  Denies fever, chills, vomiting, abdominal pain, flank pain, abnormal vaginal discharge or bleeding, hematuria.    Also reports toenail fungus x 3 years.  Has tried antifungal nail polish without relief  LMP: Patient's last menstrual period was 03/17/2020.  ROS: As in HPI.  All other pertinent ROS negative.     Past Medical History:  Diagnosis Date  . Chronic abdominal pain   . Colitis   . Counseling for birth control, oral contraceptives 06/26/2014  . Medical history non-contributory   . Missed period 06/26/2014   Past Surgical History:  Procedure Laterality Date  . CESAREAN SECTION N/A 04/13/2017   Procedure: CESAREAN SECTION;  Surgeon: [place] Bing, MD;  Location: [facility] BIRTHING SUITES;  Service: Obstetrics;  Laterality: N/A;  . NO PAST SURGERIES     Allergies  Allergen Reactions  . Mineral Oil Rash    Baby oil   No current facility-administered medications on file prior to encounter.   Current Outpatient Medications on File Prior to Encounter  Medication Sig Dispense Refill  . cyclobenzaprine (FLEXERIL) 10 MG tablet Take 1 tablet (10 mg total) by mouth 2 (two) times daily as needed for muscle spasms. 20 tablet 0  . fluconazole (DIFLUCAN) 150 MG tablet Take 1 now and 1 in 3 days 2 tablet 1  . ibuprofen (ADVIL) 800 MG tablet Take 1 tablet (800 mg total) by mouth every 6 (six) hours as needed for moderate pain. 20 tablet 0  . LO LOESTRIN FE 1 MG-10 MCG / 10 MCG tablet Take 1 tablet by mouth daily. 90 tablet 3  . metroNIDAZOLE (FLAGYL) 500 MG tablet Take 1 tablet (500 mg total) by mouth 2 (two) times daily. 14 tablet 0  . [DISCONTINUED] escitalopram  (LEXAPRO) 20 MG tablet Take 1 tablet (20 mg total) by mouth daily. (Patient not taking: Reported on 09/17/2018) 30 tablet 6   Social History   Socioeconomic History  . Marital status: Single    Spouse name: Not on file  . Number of children: 1  . Years of education: Not on file  . Highest education level: Not on file  Occupational History  . Not on file  Tobacco Use  . Smoking status: Never Smoker  . Smokeless tobacco: Never Used  Vaping Use  . Vaping Use: Never used  Substance and Sexual Activity  . Alcohol use: Yes  . Drug use: No  . Sexual activity: Yes    Birth control/protection: None, Pill  Other Topics Concern  . Not on file  Social History Narrative  . Not on file   Social Determinants of Health   Financial Resource Strain: Not on file  Food Insecurity: Not on file  Transportation Needs: Not on file  Physical Activity: Not on file  Stress: Not on file  Social Connections: Not on file  Intimate Partner Violence: Not on file   Family History  Problem Relation Age of Onset  . Cancer Mother        cervical  . Alcohol abuse Mother   . Other Father        fatty liver  . Cancer Maternal Grandmother  skin  . Dementia Maternal Grandmother   . Dementia Paternal Grandmother   . Diabetes Paternal Grandfather   . Dementia Paternal Grandfather   . Jaundice Daughter     OBJECTIVE:  Vitals:   03/31/20 1919  BP: 109/80  Pulse: 89  Resp: 17  Temp: 98.7 F (37.1 C)  TempSrc: Oral  SpO2: 98%   General appearance: Alert in no acute distress HEENT: NCAT.  Oropharynx clear.  Lungs: clear to auscultation bilaterally without adventitious breath sounds Heart: regular rate and rhythm.   Abdomen: soft; non-distended; no tenderness; bowel sounds present; no guarding Back: no CVA tenderness Extremities: no edema; symmetrical with no gross deformities Skin: warm and dry; LT great toe with yellow discoloration Neurologic: Ambulates from chair to exam table  without difficulty Psychological: alert and cooperative; normal mood and affect  Labs Reviewed  POCT URINALYSIS DIP (MANUAL ENTRY) - Abnormal; Notable for the following components:      Result Value   Color, UA light yellow (*)    Clarity, UA cloudy (*)    Leukocytes, UA Trace (*)    All other components within normal limits  URINE CULTURE  POCT URINE PREGNANCY    ASSESSMENT & PLAN:  1. Urinary frequency   2. Nausea without vomiting   3. Toenail fungus     Meds ordered this encounter  Medications  . nitrofurantoin, macrocrystal-monohydrate, (MACROBID) 100 MG capsule    Sig: Take 1 capsule (100 mg total) by mouth 2 (two) times daily.    Dispense:  10 capsule    Refill:  0    Order Specific Question:   Supervising Provider    Answer:   Eustace Moore [[id number]]  . phenazopyridine (PYRIDIUM) 200 MG tablet    Sig: Take 1 tablet (200 mg total) by mouth 3 (three) times daily.    Dispense:  6 tablet    Refill:  0    Order Specific Question:   Supervising Provider    Answer:   Eustace Moore [[id number]]   Urine concerning for UTI.  Culture sent we will follow up with you regarding abnormal results Push fluids and get plenty of rest.   Take antibiotic as directed and to completion Take pyridium as prescribed and as needed for symptomatic relief Follow up with PCP if symptoms persists Return here or go to ER if you have any new or worsening symptoms such as fever, worsening abdominal pain, nausea/vomiting, flank pain, etc...  Dermatology referral made.  They will contact you regarding your appointment  Outlined signs and symptoms indicating need for more acute intervention. Patient verbalized understanding. After Visit Summary given.     Rennis Harding, PA-C 03/31/20 [phone]

## 2020-03-31 NOTE — Discharge Instructions (Signed)
Urine concerning for UTI.  Culture sent we will follow up with you regarding abnormal results Push fluids and get plenty of rest.   Take antibiotic as directed and to completion Take pyridium as prescribed and as needed for symptomatic relief Follow up with PCP if symptoms persists Return here or go to ER if you have any new or worsening symptoms such as fever, worsening abdominal pain, nausea/vomiting, flank pain, etc...  Dermatology referral made.  They will contact you regarding your appointment

## 2020-04-02 LAB — URINE CULTURE

## 2020-04-16 ENCOUNTER — Ambulatory Visit
Admission: EM | Admit: 2020-04-16 | Discharge: 2020-04-16 | Disposition: A | Discharge status: Home / Self Care | Payer: Self-pay | Attending: Internal Medicine | Admitting: Internal Medicine

## 2020-04-16 ENCOUNTER — Other Ambulatory Visit: Payer: Self-pay

## 2020-04-16 ENCOUNTER — Encounter: Payer: Self-pay | Admitting: Emergency Medicine

## 2020-04-16 DIAGNOSIS — R300 Dysuria: Secondary | ICD-10-CM

## 2020-04-16 LAB — POCT URINALYSIS DIP (MANUAL ENTRY)
Bilirubin, UA: NEGATIVE
Glucose, UA: NEGATIVE mg/dL
Ketones, POC UA: NEGATIVE mg/dL
Nitrite, UA: NEGATIVE
Protein Ur, POC: NEGATIVE mg/dL
Spec Grav, UA: 1.02 (ref 1.010–1.025)
Urobilinogen, UA: 0.2 E.U./dL
pH, UA: 6 (ref 5.0–8.0)

## 2020-04-16 NOTE — ED Triage Notes (Signed)
History of UTI x 2 weeks ago.   Pain on urination.

## 2020-04-16 NOTE — ED Notes (Signed)
Patient requesting to be swabbed for vaginal infection.

## 2020-04-16 NOTE — Discharge Instructions (Signed)
Increase oral fluid intake I will send your urine off for cultures If urine cultures are significant we will call you with recommendations Over-the-counter Pyridium can be used for dysuria.

## 2020-04-17 NOTE — ED Provider Notes (Signed)
[facility]    CSN: [id number] Arrival date & time: 04/16/20  1916      History   Chief Complaint No chief complaint on file.   HPI [name] Brown is a 30 y.o. female comes to the urgent care with 1 day history of dysuria which started today.  Patient denies any urgency or frequency.  Patient was recently treated for culture-negative UTI 2 weeks ago.  She denies any flank pain, nausea, vomiting or lower abdominal pain.  No vaginal discharge.  No deep or superficial dyspareunia.   No change in cosmetics.  No change in soap.  Patient voids after sexual intercourse.  HPI  Past Medical History:  Diagnosis Date  . Chronic abdominal pain   . Colitis   . Counseling for birth control, oral contraceptives 06/26/2014  . Medical history non-contributory   . Missed period 06/26/2014    Patient Active Problem List   Diagnosis Date Noted  . Friable cervix 09/04/2019  . Bacteremia due to Escherichia coli 05/05/2019  . Nexplanon in place 06/27/2017  . 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  . Nexplanon insertion 06/19/2017  . 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/2017  . Status post primary low transverse cesarean section 04/14/2017  . BV (bacterial vaginosis) 09/11/2016  . Abdominal pain 04/22/2014    Past Surgical History:  Procedure Laterality Date  . CESAREAN SECTION N/A 04/13/2017   Procedure: CESAREAN SECTION;  Surgeon: [place] Bing, MD;  Location: [facility] BIRTHING SUITES;  Service: Obstetrics;  Laterality: N/A;  . NO PAST SURGERIES      OB History    Gravida  1   Para  1   Term  1   Preterm  0   AB  0   Living  1     SAB  0   IAB  0   Ectopic  0   Multiple  0   Live Births  1            Home Medications    Prior to Admission medications   Medication Sig Start Date End Date Taking? Authorizing Provider  ibuprofen (ADVIL) 800 MG tablet Take 1 tablet (800 mg total) by mouth every 6 (six) hours as needed for moderate pain. 12/21/19   Pollina, Canary Brim,  MD  LO LOESTRIN FE 1 MG-10 MCG / 10 MCG tablet Take 1 tablet by mouth daily. 12/29/19   Cheral Marker, CNM  escitalopram (LEXAPRO) 20 MG tablet Take 1 tablet (20 mg total) by mouth daily. Patient not taking: Reported on 09/17/2018 06/27/17 03/30/19  Adline Potter, NP    Family History Family History  Problem Relation Age of Onset  . Cancer Mother        cervical  . Alcohol abuse Mother   . Other Father        fatty liver  . Cancer Maternal Grandmother        skin  . Dementia Maternal Grandmother   . Dementia Paternal Grandmother   . Diabetes Paternal Grandfather   . Dementia Paternal Grandfather   . Jaundice Daughter     Social History Social History   Tobacco Use  . Smoking status: Never Smoker  . Smokeless tobacco: Never Used  Vaping Use  . Vaping Use: Never used  Substance Use Topics  . Alcohol use: Yes  . Drug use: No     Allergies   Mineral oil   Review of Systems Review of Systems  Constitutional: Negative.   HENT: Negative.   Gastrointestinal: Negative.  Negative for abdominal pain.  Genitourinary: Positive for dysuria. Negative for flank pain, frequency, urgency and vaginal discharge.  Neurological: Negative.      Physical Exam Triage Vital Signs ED Triage Vitals  Enc Vitals Group     BP 04/16/20 1934 121/79     Pulse Rate 04/16/20 1934 83     Resp 04/16/20 1934 18     Temp 04/16/20 1934 98 F (36.7 C)     Temp Source 04/16/20 1934 Oral     SpO2 04/16/20 1934 95 %     Weight --      Height --      Head Circumference --      Peak Flow --      Pain Score 04/16/20 1935 5     Pain Loc --      Pain Edu? --      Excl. in GC? --    No data found.  Updated Vital Signs BP 121/79 (BP Location: Right Arm)   Pulse 83   Temp 98 F (36.7 C) (Oral)   Resp 18   LMP 03/17/2020   SpO2 95%   Visual Acuity Right Eye Distance:   Left Eye Distance:   Bilateral Distance:    Right Eye Near:   Left Eye Near:    Bilateral Near:      Physical Exam Vitals and nursing note reviewed.  Constitutional:      General: She is not in acute distress.    Appearance: She is not ill-appearing.  Cardiovascular:     Rate and Rhythm: Normal rate and regular rhythm.  Abdominal:     General: Bowel sounds are normal.     Palpations: Abdomen is soft.  Musculoskeletal:        General: Normal range of motion.  Neurological:     Mental Status: She is alert.      UC Treatments / Results  Labs (all labs ordered are listed, but only abnormal results are displayed) Labs Reviewed  POCT URINALYSIS DIP (MANUAL ENTRY) - Abnormal; Notable for the following components:      Result Value   Clarity, UA cloudy (*)    Blood, UA trace-intact (*)    Leukocytes, UA Trace (*)    All other components within normal limits  URINE CULTURE  CERVICOVAGINAL ANCILLARY ONLY    EKG   Radiology No results found.  Procedures Procedures (including critical care time)  Medications Ordered in UC Medications - No data to display  Initial Impression / Assessment and Plan / UC Course  I have reviewed the triage vital signs and the nursing notes.  Pertinent labs & imaging results that were available during my care of the patient were reviewed by me and considered in my medical decision making (see chart for details).     1.  Dysuria: Point-of-care urinalysis is positive for trace leukocyte Estrace and trace blood Urine cultures have been sent Pyridium as needed for dysuria Increase oral fluid intake Return precautions given. Final Clinical Impressions(s) / UC Diagnoses   Final diagnoses:  Dysuria     Discharge Instructions     Increase oral fluid intake I will send your urine off for cultures If urine cultures are significant we will call you with recommendations Over-the-counter Pyridium can be used for dysuria.   ED Prescriptions    None     PDMP not reviewed this encounter.   Merrilee Jansky, MD 04/17/20 [phone]

## 2020-04-18 LAB — URINE CULTURE: Culture: <10000 — AB

## 2020-04-19 LAB — CERVICOVAGINAL ANCILLARY ONLY
Bacterial Vaginitis (gardnerella): NEGATIVE
Candida Glabrata: NEGATIVE
Candida Vaginitis: NEGATIVE
Chlamydia: NEGATIVE
Comment: NEGATIVE
Comment: NEGATIVE
Comment: NEGATIVE
Comment: NEGATIVE
Comment: NEGATIVE
Comment: NORMAL
Neisseria Gonorrhea: NEGATIVE
Trichomonas: NEGATIVE

## 2020-04-21 ENCOUNTER — Ambulatory Visit: Payer: Medicaid Other | Admitting: Women's Health

## 2020-05-03 ENCOUNTER — Emergency Department (HOSPITAL_COMMUNITY)
Admission: EM | Admit: 2020-05-03 | Discharge: 2020-05-03 | Disposition: A | Discharge status: Home / Self Care | Payer: Self-pay | Attending: Emergency Medicine | Admitting: Emergency Medicine

## 2020-05-03 ENCOUNTER — Other Ambulatory Visit: Payer: Self-pay

## 2020-05-03 ENCOUNTER — Emergency Department (HOSPITAL_COMMUNITY): Payer: Self-pay

## 2020-05-03 ENCOUNTER — Encounter (HOSPITAL_COMMUNITY): Payer: Self-pay | Admitting: Emergency Medicine

## 2020-05-03 DIAGNOSIS — K529 Noninfective gastroenteritis and colitis, unspecified: Secondary | ICD-10-CM | POA: Insufficient documentation

## 2020-05-03 LAB — BASIC METABOLIC PANEL
Anion gap: 11 (ref 5–15)
BUN: 10 mg/dL (ref 6–20)
CO2: 23 mmol/L (ref 22–32)
Calcium: 9.3 mg/dL (ref 8.9–10.3)
Chloride: 103 mmol/L (ref 98–111)
Creatinine, Ser: 0.65 mg/dL (ref 0.44–1.00)
GFR, Estimated: >60 mL/min (ref >60)
Glucose, Bld: 98 mg/dL (ref 70–99)
Potassium: 4 mmol/L (ref 3.5–5.1)
Sodium: 137 mmol/L (ref 135–145)

## 2020-05-03 LAB — URINALYSIS, ROUTINE W REFLEX MICROSCOPIC
Bacteria, UA: NONE SEEN
Bilirubin Urine: NEGATIVE
Glucose, UA: NEGATIVE mg/dL
Ketones, ur: NEGATIVE mg/dL
Leukocytes,Ua: NEGATIVE
Nitrite: NEGATIVE
Protein, ur: 100 mg/dL — AB
RBC / HPF: >50 RBC/hpf — ABNORMAL HIGH (ref 0–5)
Specific Gravity, Urine: 1.031 — ABNORMAL HIGH (ref 1.005–1.030)
pH: 5 (ref 5.0–8.0)

## 2020-05-03 LAB — CBC WITH DIFFERENTIAL/PLATELET
Abs Immature Granulocytes: 0.08 10*3/uL — ABNORMAL HIGH (ref 0.00–0.07)
Basophils Absolute: 0 10*3/uL (ref 0.0–0.1)
Basophils Relative: 0 %
Eosinophils Absolute: 0 10*3/uL (ref 0.0–0.5)
Eosinophils Relative: 0 %
HCT: 42.8 % (ref 36.0–46.0)
Hemoglobin: 14.1 g/dL (ref 12.0–15.0)
Immature Granulocytes: 1 %
Lymphocytes Relative: 4 %
Lymphs Abs: 0.7 10*3/uL (ref 0.7–4.0)
MCH: 31.3 pg (ref 26.0–34.0)
MCHC: 32.9 g/dL (ref 30.0–36.0)
MCV: 94.9 fL (ref 80.0–100.0)
Monocytes Absolute: 0.8 10*3/uL (ref 0.1–1.0)
Monocytes Relative: 5 %
Neutro Abs: 13.9 10*3/uL — ABNORMAL HIGH (ref 1.7–7.7)
Neutrophils Relative %: 90 %
Platelets: 296 10*3/uL (ref 150–400)
RBC: 4.51 MIL/uL (ref 3.87–5.11)
RDW: 12.5 % (ref 11.5–15.5)
WBC: 15.5 10*3/uL — ABNORMAL HIGH (ref 4.0–10.5)
nRBC: 0 % (ref 0.0–0.2)

## 2020-05-03 LAB — HEPATIC FUNCTION PANEL
ALT: 24 U/L (ref 0–44)
AST: 20 U/L (ref 15–41)
Albumin: 4.4 g/dL (ref 3.5–5.0)
Alkaline Phosphatase: 68 U/L (ref 38–126)
Bilirubin, Direct: 0.1 mg/dL (ref 0.0–0.2)
Indirect Bilirubin: 0.8 mg/dL (ref 0.3–0.9)
Total Bilirubin: 0.9 mg/dL (ref 0.3–1.2)
Total Protein: 8 g/dL (ref 6.5–8.1)

## 2020-05-03 LAB — LIPASE, BLOOD: Lipase: 29 U/L (ref 11–51)

## 2020-05-03 LAB — POC URINE PREG, ED: Preg Test, Ur: NEGATIVE

## 2020-05-03 MED ORDER — ONDANSETRON HCL 4 MG/2ML IJ SOLN
4.0000 mg | Freq: Once | INTRAMUSCULAR | Status: DC
Start: 2020-05-03 — End: 2020-05-04

## 2020-05-03 MED ORDER — ACETAMINOPHEN 325 MG PO TABS
650.0000 mg | ORAL_TABLET | Freq: Once | ORAL | Status: AC
  Administered 2020-05-03: 650 mg via ORAL
  Filled 2020-05-03: qty 2

## 2020-05-03 MED ORDER — OMEPRAZOLE 20 MG PO CPDR: 20.0000 mg | DELAYED_RELEASE_CAPSULE | Freq: Two times a day (BID) | ORAL | 0 refills | Status: DC

## 2020-05-03 MED ORDER — IOHEXOL 300 MG/ML  SOLN
100.0000 mL | Freq: Once | INTRAMUSCULAR | Status: AC | PRN
  Administered 2020-05-03: 100 mL via INTRAVENOUS

## 2020-05-03 NOTE — ED Provider Notes (Signed)
[facility] EMERGENCY DEPARTMENT Provider Note   CSN: [id number] Arrival date & time: 05/03/20  1408     History Chief Complaint  Patient presents with  . Abdominal Pain    [name] Brown is a 30 y.o. female with past medical history of chronic abdominal pain and colitis who presents the ED with complaints of mid upper abdominal pain described as nonradiating.  MSE was obtained 5 hours prior to my examination.  There is epigastric tenderness noted, but no other abnormal physical exam findings.  Laboratory work-up and CT imaging ordered prior to my initial examination.  I reviewed patient's medical record.  Patient has been evaluated for dysuria on multiple occasions in the ED or at an urgent care, as recently as 04/16/2020.  Urine culture obtained at that time demonstrated less than 10,000 colonies/insignificant growth.  On my examination, patient reports that she developed abdominal pain this morning.  She states that she has reflux disease multiple times each and every week, but does not take any medications.  She has not seen a primary care provider in several years.  She states that her pain is most notably in the epigastrium, but is tender over the abdomen, predominantly in lower regions.  She states that she has been feeling mildly constipated for the past week, last BM was nonbloody and nonmelanotic.  She denies any nausea, vomiting, fevers or chills, chest pain or shortness of breath, dysuria, increased urinary frequency, pain with intercourse, vaginal discharge, or other symptoms.  She states that she started her menses yesterday.  Patient also notes that approximately 10 years ago she had a similar episode of abdominal pain that was ultimately diagnosed as colitis.  She was advised to follow-up with gastroenterology, however she states that they did not want to proceed with colonoscopy given her age at the time.  Abdominal surgical history notable only for prior C-section.  HPI      Past Medical History:  Diagnosis Date  . Chronic abdominal pain   . Colitis   . Counseling for birth control, oral contraceptives 06/26/2014  . Medical history non-contributory   . Missed period 06/26/2014    Patient Active Problem List   Diagnosis Date Noted  . Friable cervix 09/04/2019  . Bacteremia due to Escherichia coli 05/05/2019  . Nexplanon in place 06/27/2017  . 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  . Nexplanon insertion 06/19/2017  . 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/2017  . Status post primary low transverse cesarean section 04/14/2017  . BV (bacterial vaginosis) 09/11/2016  . Abdominal pain 04/22/2014    Past Surgical History:  Procedure Laterality Date  . CESAREAN SECTION N/A 04/13/2017   Procedure: CESAREAN SECTION;  Surgeon: Imperial Bing, MD;  Location: [facility] BIRTHING SUITES;  Service: Obstetrics;  Laterality: N/A;  . NO PAST SURGERIES       OB History    Gravida  1   Para  1   Term  1   Preterm  0   AB  0   Living  1     SAB  0   IAB  0   Ectopic  0   Multiple  0   Live Births  1           Family History  Problem Relation Age of Onset  . Cancer Mother        cervical  . Alcohol abuse Mother   . Other Father        fatty liver  . Cancer Maternal Grandmother  skin  . Dementia Maternal Grandmother   . Dementia Paternal Grandmother   . Diabetes Paternal Grandfather   . Dementia Paternal Grandfather   . Jaundice Daughter     Social History   Tobacco Use  . Smoking status: Never Smoker  . Smokeless tobacco: Never Used  Vaping Use  . Vaping Use: Never used  Substance Use Topics  . Alcohol use: Yes  . Drug use: No    Home Medications Prior to Admission medications   Medication Sig Start Date End Date Taking? Authorizing Provider  omeprazole (PRILOSEC) 20 MG capsule Take 1 capsule (20 mg total) by mouth 2 (two) times daily before a meal. 05/03/20  Yes Lorelee New, PA-C  ibuprofen (ADVIL) 800 MG tablet Take 1 tablet  (800 mg total) by mouth every 6 (six) hours as needed for moderate pain. Patient not taking: No sig reported 12/21/19   Pollina, Canary Brim, MD  LO LOESTRIN FE 1 MG-10 MCG / 10 MCG tablet Take 1 tablet by mouth daily. Patient not taking: No sig reported 12/29/19   Cheral Marker, CNM  escitalopram (LEXAPRO) 20 MG tablet Take 1 tablet (20 mg total) by mouth daily. Patient not taking: Reported on 09/17/2018 06/27/17 03/30/19  Adline Potter, NP    Allergies    Mineral oil  Review of Systems   Review of Systems  All other systems reviewed and are negative.   Physical Exam Updated Vital Signs BP 111/68   Pulse 87   Temp 98.7 F (37.1 C) (Oral)   Resp 17   Ht 5\' 4"  (1.626 m)   Wt 74.8 kg   LMP 05/02/2020   SpO2 100%   BMI 28.32 kg/m   Physical Exam Vitals and nursing note reviewed. Exam conducted with a chaperone present.  Constitutional:      General: She is not in acute distress.    Appearance: Normal appearance. She is not toxic-appearing.  HENT:     Head: Normocephalic and atraumatic.  Eyes:     General: No scleral icterus.    Conjunctiva/sclera: Conjunctivae normal.  Cardiovascular:     Rate and Rhythm: Normal rate.     Pulses: Normal pulses.  Pulmonary:     Effort: Pulmonary effort is normal. No respiratory distress.  Abdominal:     General: Abdomen is flat. There is no distension.     Palpations: Abdomen is soft.     Tenderness: There is abdominal tenderness. There is no right CVA tenderness, left CVA tenderness or guarding.     Comments: Soft, nondistended.  TTP in epigastrium, periumbilical region, suprapubic region, RLQ, and LLQ.  No guarding.  No overlying skin changes.  No peritoneal signs.  Musculoskeletal:        General: Normal range of motion.  Skin:    General: Skin is dry.  Neurological:     Mental Status: She is alert and oriented to person, place, and time.     GCS: GCS eye subscore is 4. GCS verbal subscore is 5. GCS motor subscore is 6.   Psychiatric:        Mood and Affect: Mood normal.        Behavior: Behavior normal.        Thought Content: Thought content normal.     ED Results / Procedures / Treatments   Labs (all labs ordered are listed, but only abnormal results are displayed) Labs Reviewed  URINALYSIS, ROUTINE W REFLEX MICROSCOPIC - Abnormal; Notable for the following components:  Result Value   APPearance HAZY (*)    Specific Gravity, Urine 1.031 (*)    Hgb urine dipstick LARGE (*)    Protein, ur 100 (*)    RBC / HPF >50 (*)    All other components within normal limits  CBC WITH DIFFERENTIAL/PLATELET - Abnormal; Notable for the following components:   WBC 15.5 (*)    Neutro Abs 13.9 (*)    Abs Immature Granulocytes 0.08 (*)    All other components within normal limits  BASIC METABOLIC PANEL  HEPATIC FUNCTION PANEL  LIPASE, BLOOD  POC URINE PREG, ED    EKG None  Radiology CT ABDOMEN PELVIS W CONTRAST  Result Date: 05/03/2020 CLINICAL DATA:  Epigastric pain.  Pain onset this morning. EXAM: CT ABDOMEN AND PELVIS WITH CONTRAST TECHNIQUE: Multidetector CT imaging of the abdomen and pelvis was performed using the standard protocol following bolus administration of intravenous contrast. CONTRAST:  100mL OMNIPAQUE IOHEXOL 300 MG/ML  SOLN COMPARISON:  Noncontrast CT 12/21/2019 FINDINGS: Lower chest: The lung bases are clear. No pleural effusion or focal airspace disease. Hepatobiliary: No focal liver abnormality is seen. No gallstones, gallbladder wall thickening, or biliary dilatation. Pancreas: No ductal dilatation or inflammation. Spleen: Normal in size without focal abnormality. Adrenals/Urinary Tract: Normal adrenal glands. No hydronephrosis. Minimal cortical scarring in both kidneys. No stone or focal lesion. Urinary bladder is near completely empty and not well assessed. Stomach/Bowel: Short to moderate length segment of small bowel wall thickening, mild hyperemia and mesenteric edema involving the  pelvic small bowel most consistent with enteritis, for example series 2, image 71. No bowel pneumatosis or obstruction. Small amount of adjacent free fluid tracks into the pelvis. Remaining small bowel is unremarkable. Stomach is partially distended. There is no colonic wall thickening or inflammation. Normal appendix. Vascular/Lymphatic: Normal caliber abdominal aorta. Patent portal vein. Mesenteric vessels are patent. No portal venous or mesenteric gas. Few prominent mesenteric nodes in the region of small bowel inflammation. Few prominent ileocolic lymph nodes. Reproductive: Unremarkable CT appearance of the uterus. Ovaries are symmetric in size. No cyst or adnexal mass. Other: Small volume of free fluid in the pelvis is likely reactive related small bowel inflammation. No free air or abscess. No abdominal wall hernia. Musculoskeletal: There are no acute or suspicious osseous abnormalities. IMPRESSION: Short to moderate length segment of small bowel wall thickening, mild hyperemia and mesenteric edema involving the pelvic small bowel most consistent with enteritis, likely infectious or inflammatory. Small volume of free fluid in the pelvis is likely reactive related small bowel inflammation. No perforation or abscess. Electronically Signed   By: Narda RutherfordMelanie  Sanford M.D.   On: 05/03/2020 22:40    Procedures Procedures   Medications Ordered in ED Medications  ondansetron [facility](ZOFRAN) injection 4 mg (0 mg Intravenous Hold 05/03/20 2213)  iohexol (OMNIPAQUE) 300 MG/ML solution 100 mL (100 mLs Intravenous Contrast Given 05/03/20 2223)  acetaminophen (TYLENOL) tablet 650 mg (650 mg Oral Given 05/03/20 2327)    ED Course  I have reviewed the triage vital signs and the nursing notes.  Pertinent labs & imaging results that were available during my care of the patient were reviewed by me and considered in my medical decision making (see chart for details).    MDM Rules/Calculators/A&P                           [name] Brown was evaluated in Emergency Department on 05/03/2020 for the symptoms described in the  history of present illness. She was evaluated in the context of the global COVID-19 pandemic, which necessitated consideration that the patient might be at risk for infection with the SARS-CoV-2 virus that causes COVID-19. Institutional protocols and algorithms that pertain to the evaluation of patients at risk for COVID-19 are in a state of rapid change based on information released by regulatory bodies including the CDC and federal and state organizations. These policies and algorithms were followed during the patient's care in the ED.  I personally reviewed patient's medical chart and all notes from triage and staff during today's encounter. I have also ordered and reviewed all labs and imaging that I felt to be medically necessary in the evaluation of this patient's complaints and with consideration of their physical exam. If needed, translation services were available and utilized.   Patient with acute onset generalized abdominal pain tenderness.  She denies any associated nausea, vomiting, fevers or chills, or changes in her bowel habits.  She admits that she is mildly constipated and states that she has not been eating and drinking well.    Patient does have leukocytosis to 15.5 with increased neutrophil count.  CT abdomen and pelvis obtained with contrast demonstrates short to moderate length segment of small bowel wall thickening, hyperemia, and mesenteric edema, suggestive of colitis.  Unclear as to whether or not infectious versus inflammatory.  She denies any chronic history of mixed IBS symptoms or any hematochezia versus melena.  While she does have the prior history of colitis, I have lower suspicion for IBD at this time.  I have higher suspicion for infectious colitis, whether viral versus bacterial.  Patient will see her OB/GYN for appointment as scheduled on 05/06/2020.  Given brevity of  illness, lack of melena or hematochezia, and lack of any fevers, I have lower suspicion for bacterial pathology at this time.  I will prescribe her a course of Augmentin, but encouraged her to abstain and first attempt to manage her symptoms conservatively.   She also had a headache here in the ED which she attributes to being in the ED for 9 hours without any food or drink.  Will p.o. challenge and provide Tylenol.  ED return precautions discussed.  Patient voices understanding and is agreeable to plan.  Final Clinical Impression(s) / ED Diagnoses Final diagnoses:  Enteritis    Rx / DC Orders ED Discharge Orders         Ordered    omeprazole (PRILOSEC) 20 MG capsule  2 times daily before meals        05/03/20 2328           Lorelee New, PA-C 05/03/20 2328    Derwood Kaplan, MD 05/07/20 1758

## 2020-05-03 NOTE — ED Triage Notes (Signed)
Emergency Medicine Provider Triage Evaluation Note  [name] Brown , a 30 y.o. female  was evaluated in triage.  Pt complains of epigastric pain and nausea that woke her from sleep this morning.  She describes a sharp stabbing pain of her mid upper abdomen that is nonradiating.  She took Pepto Bismol and Alka-Seltzer this morning without relief.  No fever, chills, chest pain, shortness of breath.  One episode of loose stools this morning, but endorses recent constipation.  Review of Systems  Positive: Abdominal pain, nausea, loose stool this morning Negative: Fever, vomiting, dysuria, back pain  Physical Exam  BP 110/67   Pulse 94   Temp 98.7 F (37.1 C) (Oral)   Resp 18   Ht 5\' 4"  (1.626 m)   Wt 74.8 kg   LMP 05/02/2020   SpO2 100%   BMI 28.32 kg/m  Gen:   Awake, no distress   HEENT:  Atraumatic  Resp:  Normal effort  Cardiac:  Normal rate and rhythm Abd:   Epigastric tenderness, nondistended MSK:   Moves extremities without difficulty  Neuro:  Speech clear   Medical Decision Making  Medically screening exam initiated at 5:05 PM.  Appropriate orders placed.  [name] Brown was informed that the remainder of the evaluation will be completed by another provider, this initial triage assessment does not replace that evaluation, and the importance of remaining in the ED until their evaluation is complete.  Clinical Impression  Patient here with epigastric pain and nausea.  Tender on exam.  Will need further ER evaluation for possible gallbladder disease.   07/02/2020, PA-C 05/03/20 1708

## 2020-05-03 NOTE — ED Triage Notes (Signed)
Pt c/o stomach pain and nausea that started this morning.

## 2020-05-03 NOTE — Discharge Instructions (Addendum)
Your history, physical exam, laboratory work-up, and imaging is suggestive of enteritis, inflammation involving small bowel.  There is no abscess or other complication noted in the imaging.    I recommend increased oral hydration and high-fiber diet given your constipation symptoms.    I prescribed you Augmentin x10 days.  Please refrain from taking and instead try to treat your symptoms conservatively for the next few days as I suspect that this is likely viral in etiology.    However, if you are still endorsing abdominal pain or if you develop any worsening symptoms between now and your appointment with your OB/GYN on 05/06/2020, please take antibiotics.  Please have discussed with your OB/GYN as to whether or not they would like to function is your primary care provider given that you do not have significant comorbid disease.  However, if not, I encourage you get established with a primary care provider soon as possible.  I have also referred you to Dr. Jena Gauss, local gastroenterologist, for ongoing evaluation and management of your abdominal discomfort.  Given that you have had enteritis in the past, you may benefit from further work-up, particularly if your symptoms fail to improve.  I have also prescribed you omeprazole which I would like you to take given your frequent symptoms of heartburn.  Please take 30 minutes before breakfast and dinner.  Return to the ED or seek immediate medical attention should you experience any new or worsening symptoms.

## 2020-05-03 NOTE — ED Notes (Signed)
Patient transported to CT 

## 2020-05-06 ENCOUNTER — Encounter: Payer: Self-pay | Admitting: Women's Health

## 2020-05-06 ENCOUNTER — Ambulatory Visit (INDEPENDENT_AMBULATORY_CARE_PROVIDER_SITE_OTHER): Payer: Self-pay | Admitting: Family Medicine

## 2020-05-06 ENCOUNTER — Other Ambulatory Visit: Payer: Self-pay

## 2020-05-06 ENCOUNTER — Ambulatory Visit (INDEPENDENT_AMBULATORY_CARE_PROVIDER_SITE_OTHER): Payer: Medicaid Other | Admitting: Women's Health

## 2020-05-06 ENCOUNTER — Encounter: Payer: Self-pay | Admitting: Family Medicine

## 2020-05-06 ENCOUNTER — Other Ambulatory Visit (HOSPITAL_COMMUNITY)
Admission: RE | Admit: 2020-05-06 | Discharge: 2020-05-06 | Disposition: A | Discharge status: Home / Self Care | Payer: Medicaid Other | Source: Ambulatory Visit | Attending: Obstetrics & Gynecology | Admitting: Obstetrics & Gynecology

## 2020-05-06 VITALS — BP 110/74 | HR 84 | Ht 64.0 in | Wt 158.6 lb

## 2020-05-06 VITALS — BP 107/80 | HR 86 | Ht 64.0 in | Wt 161.0 lb

## 2020-05-06 DIAGNOSIS — Z113 Encounter for screening for infections with a predominantly sexual mode of transmission: Secondary | ICD-10-CM

## 2020-05-06 DIAGNOSIS — Z3202 Encounter for pregnancy test, result negative: Secondary | ICD-10-CM

## 2020-05-06 DIAGNOSIS — F418 Other specified anxiety disorders: Secondary | ICD-10-CM

## 2020-05-06 DIAGNOSIS — Z01419 Encounter for gynecological examination (general) (routine) without abnormal findings: Secondary | ICD-10-CM | POA: Insufficient documentation

## 2020-05-06 DIAGNOSIS — Z3009 Encounter for other general counseling and advice on contraception: Secondary | ICD-10-CM | POA: Diagnosis not present

## 2020-05-06 DIAGNOSIS — L609 Nail disorder, unspecified: Secondary | ICD-10-CM

## 2020-05-06 DIAGNOSIS — B351 Tinea unguium: Secondary | ICD-10-CM

## 2020-05-06 DIAGNOSIS — Z0000 Encounter for general adult medical examination without abnormal findings: Secondary | ICD-10-CM | POA: Diagnosis not present

## 2020-05-06 LAB — POCT URINE PREGNANCY: Preg Test, Ur: NEGATIVE

## 2020-05-06 MED ORDER — ESCITALOPRAM OXALATE 10 MG PO TABS: 10.0000 mg | ORAL_TABLET | Freq: Every day | ORAL | 6 refills | Status: DC

## 2020-05-06 NOTE — Progress Notes (Signed)
    SUBJECTIVE:   CHIEF COMPLAINT / HPI: "Toenail fungus "  [name] Brown is a 30 year old female presenting to the family medicine dermatology clinic for evaluation of toenail fungus and lifted toenail.   She reports an approximate 3-4-year history of onychomycosis on her left great toenail.  Reports she was previously prescribed a medicated nail polish for treatment, use for about 1 month and then discontinued because she frequently forgot.  She is not interested in oral medication at this time due to concern for her liver (her father had a "bad" liver) and feels like she would forget.  Additionally, states that she hit her toenail on something at work which lifted the nail about 2-3 weeks ago.  She now has discomfort at the nail and more proximal on her first metatarsal.  Bothers her when she has on shoes or socks.  Overall, she is requesting that her entire toenail be taken off.  She is frustrated with the fungus and would rather have the nail taken off then trial different therapies.  PERTINENT  PMH / PSH: Nexplanon in place  OBJECTIVE:   BP 107/80   Pulse 86   Ht 5\' 4"  (1.626 m)   Wt 161 lb (73 kg)   LMP 05/02/2020 (Exact Date)   SpO2 98%   BMI 27.64 kg/m   General: Alert, NAD Lungs: No increased WOB  Ext: Warm, dry MSK: Nail polish present over the great toe, unable to visualize surface.  Do note some mild thickening of the great toenail.  Some lifting of nail off of nailbed, however still adhered into proximal nail fold.  Nontender to palpation of the lateral nail fold without any erythema or pus drainage.  Full ROM of great toe without pain.    During encounter, trimmed nail down to the level of picture shown below.  Saved nail clippings to send to pathology.  ASSESSMENT/PLAN:   Nail abnormality Mild nail lifting from nail bed after trauma and patient concern for onychomycosis.  Unfortunately unable to fully visualize toenail today, however does have some thickening.  She is  adamant for toenail removal for resolution of fungus and lift, however thoroughly discussed that would not be necessary or appropriate at this time.  Given increased risk of infection, damage to the area, possibility of no nail re-growing, and not a definitive treatment for onychomycosis, felt that this outweighed the potential benefit of full removal currently.  Trimmed nail down today and recommended bandaging to minimize amount of lifting as new healthy nail grows and slowly.  Sent clippings for fungal culture.  Can use a Band-Aid to press down the nail as needed.    Follow-up in approximately 1 month to check in on toenail progress, pending culture can discuss safe medication options for fungal treatment if needed.  Remove nail polish prior to next appointment.  07/02/2020, DO [facility]

## 2020-05-06 NOTE — Assessment & Plan Note (Addendum)
Mild nail lifting from nail bed after trauma and patient concern for onychomycosis.  Unfortunately unable to fully visualize toenail today, however does have some thickening.  She is adamant for toenail removal for resolution of fungus and lift, however thoroughly discussed that would not be necessary or appropriate at this time.  Given increased risk of infection, damage to the area, possibility of no nail re-growing, and not a definitive treatment for onychomycosis, felt that this outweighed the potential benefit of full removal currently.  Trimmed nail down today and recommended bandaging to minimize amount of lifting as new healthy nail grows and slowly.  Sent clippings for fungal culture.  Can use a Band-Aid to press down the nail as needed.

## 2020-05-06 NOTE — Patient Instructions (Signed)
It was wonderful to see you today.  We recommend continue to keep your toenail trim as it grows off slowly.  This allows you the best chance of a healthy nail and less risk of infection.  You can always use a Band-Aid to press down on the toe just a little bit when you are wearing your work shoes that does not bother you as much.  We also recommend that you take off your nail polish to allow this nail to breathe.  You can always try Vicks vapor rub to help with some fungus appearance.  Please follow-up in 1 month we will check in to see how your nail is doing.

## 2020-05-06 NOTE — Patient Instructions (Signed)
NO SEX UNTIL AFTER YOU GET YOUR BIRTH CONTROL  

## 2020-05-06 NOTE — Progress Notes (Signed)
WELL-WOMAN EXAMINATION Patient name: [name] Brown MRN [id number]  Date of birth: October 29, 1990 Chief Complaint:   Contraception  History of Present Illness:   [name] Brown is a 30 y.o. G39P1001 Caucasian female being seen today for a routine [facility] well-woman exam.  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  Wants birth control, Paragard IUD H/O dep/anx, was on Lexapro which helped, feels she needs to get back on it. Denies SI/HI. Declines therapy.   Depression screen [facility] 2/9 05/06/2020 06/27/2017 09/20/2016 08/22/2016 04/20/2016  Decreased Interest 1 1 1 1 1   Down, Depressed, Hopeless 1 1 1 1 1   PHQ - 2 Score 2 2 2 2 2   Altered sleeping 2 0 0 1 -  Tired, decreased energy 2 1 1 1  -  Change in appetite 1 1 1  0 -  Feeling bad or failure about yourself  2 1 1 1  -  Trouble concentrating 1 1 0 0 -  Moving slowly or fidgety/restless 1 0 - 0 -  Suicidal thoughts 0 0 0 0 -  PHQ-9 Score 11 6 5 5  -  Difficult doing work/chores - Not difficult at all Not difficult at all Not difficult at all -  GA7=10   PCP: none  Patient's last menstrual period was 05/02/2020 (exact date). The current method of family planning is none.  Last pap 09/20/16. Results were: NILM w/ HRHPV not done. H/O abnormal pap: no Last mammogram: never. Results were: N/A. Family h/o breast cancer: no Last colonoscopy: never. Results were: N/A. Family h/o colorectal cancer: no Review of Systems:   Pertinent items are noted in HPI Denies any headaches, blurred vision, fatigue, shortness of breath, chest pain, abdominal pain, abnormal vaginal discharge/itching/odor/irritation, problems with periods, bowel movements, urination, or intercourse unless otherwise stated above. Pertinent History Reviewed:  Reviewed past medical,surgical, social and family history.  Reviewed problem list, medications and allergies. Physical Assessment:   Vitals:   05/06/20 1416  BP: 110/74  Pulse:  84  Weight: 158 lb 9.6 oz (71.9 kg)  Height: 5\' 4"  (1.626 m)  Body mass index is 27.22 kg/m.        Physical Examination:   General appearance - well appearing, and in no distress  Mental status - alert, oriented to person, place, and time  Psych:  She has a normal mood and affect  Skin - warm and dry, normal color, no suspicious lesions noted  Chest - effort normal, all lung fields clear to auscultation bilaterally  Heart - normal rate and regular rhythm  Neck:  midline trachea, no thyromegaly or nodules  Breasts - breasts appear normal, no suspicious masses, no skin or nipple changes or  axillary nodes  Abdomen - soft, nontender, nondistended, no masses or organomegaly  Pelvic - VULVA: normal appearing vulva with no masses, tenderness or lesions  VAGINA: normal appearing vagina with normal color and discharge, no lesions  CERVIX: normal appearing cervix without discharge or lesions, no CMT  Thin prep pap is done w/ HR HPV cotesting  UTERUS: uterus is felt to be normal size, shape, consistency and nontender   ADNEXA: No adnexal masses or tenderness noted.  Extremities:  No swelling or varicosities noted  Chaperone:    Results for orders placed or performed in visit on 05/06/20 (from the past 24 hour(s))  POCT urine pregnancy   Collection Time: 05/06/20  2:31 PM  Result Value Ref Range   Preg Test,  Ur Negative Negative    Assessment & Plan:  1) Well-Woman [facility] Exam  2) STD screen> including HSV2 (recent exposure), understands [facility] may not cover/she would get bill  3) Dep/anx> rx Lexapro 10mg , f/u 4wks   4) Contraception counseling> abstinence until IUD insertion (wants Paragard)  Labs/procedures today: pap, labs as below  Mammogram: @ 28yo, or sooner if problems Colonoscopy: @ 28yo, or sooner if problems  Orders Placed This Encounter  Procedures  . RPR  . HIV Antibody (routine testing w rflx)  . HSV 2 antibody, IgG  . POCT urine pregnancy     Meds:  Meds ordered this encounter  Medications  . escitalopram (LEXAPRO) 10 MG tablet    Sig: Take 1 tablet (10 mg total) by mouth daily.    Dispense:  30 tablet    Refill:  6    Order Specific Question:   Supervising Provider    Answer:   [[id number]]    Follow-up: Return for 1st available, IUD insertion; then 4wks later for IUD & med f/u.  Lazaro Arms CNM, [facility] 05/06/2020 3:03 PM

## 2020-05-07 LAB — HIV ANTIBODY (ROUTINE TESTING W REFLEX): HIV Screen 4th Generation wRfx: NONREACTIVE

## 2020-05-07 LAB — RPR: RPR Ser Ql: NONREACTIVE

## 2020-05-07 LAB — HSV 2 ANTIBODY, IGG: HSV 2 IgG, Type Spec: <0.91 index (ref 0.00–0.90)

## 2020-05-12 ENCOUNTER — Telehealth: Payer: Self-pay

## 2020-05-12 DIAGNOSIS — R87610 Atypical squamous cells of undetermined significance on cytologic smear of cervix (ASC-US): Secondary | ICD-10-CM

## 2020-05-12 HISTORY — DX: Atypical squamous cells of undetermined significance on cytologic smear of cervix (ASC-US): R87.610

## 2020-05-12 LAB — CYTOLOGY - PAP
Chlamydia: NEGATIVE
Comment: NEGATIVE
Comment: NEGATIVE
Comment: NEGATIVE
Comment: NORMAL
Diagnosis: UNDETERMINED — AB
HPV 16: NEGATIVE
HPV 18 / 45: NEGATIVE
High risk HPV: POSITIVE — AB
Neisseria Gonorrhea: NEGATIVE

## 2020-05-12 NOTE — Telephone Encounter (Signed)
Called and pt informed needs colpo per ASCCP guidelines, pap was ASCUS with +HRHPV.,( has immediate risk of CIN 3+ of 4.4 %,) will get colpo, before getting IUD placed

## 2020-05-12 NOTE — Telephone Encounter (Signed)
Pt called wanting a provider to call her back about her lab results.

## 2020-05-18 ENCOUNTER — Ambulatory Visit (INDEPENDENT_AMBULATORY_CARE_PROVIDER_SITE_OTHER): Payer: Self-pay | Admitting: Women's Health

## 2020-05-18 ENCOUNTER — Other Ambulatory Visit (HOSPITAL_COMMUNITY)
Admission: RE | Admit: 2020-05-18 | Discharge: 2020-05-18 | Disposition: A | Discharge status: Home / Self Care | Payer: Medicaid Other | Source: Ambulatory Visit | Attending: Obstetrics & Gynecology | Admitting: Obstetrics & Gynecology

## 2020-05-18 ENCOUNTER — Encounter: Payer: Self-pay | Admitting: Women's Health

## 2020-05-18 ENCOUNTER — Other Ambulatory Visit: Payer: Self-pay

## 2020-05-18 VITALS — BP 122/78 | HR 79 | Ht 64.0 in | Wt 161.0 lb

## 2020-05-18 DIAGNOSIS — Z3202 Encounter for pregnancy test, result negative: Secondary | ICD-10-CM

## 2020-05-18 DIAGNOSIS — R87810 Cervical high risk human papillomavirus (HPV) DNA test positive: Secondary | ICD-10-CM

## 2020-05-18 DIAGNOSIS — L292 Pruritus vulvae: Secondary | ICD-10-CM | POA: Diagnosis present

## 2020-05-18 DIAGNOSIS — R87610 Atypical squamous cells of undetermined significance on cytologic smear of cervix (ASC-US): Secondary | ICD-10-CM

## 2020-05-18 LAB — POCT URINE PREGNANCY: Preg Test, Ur: NEGATIVE

## 2020-05-18 NOTE — Patient Instructions (Signed)
[email or web address]  Colposcopy, Care After This sheet gives you information about how to care for yourself after your procedure. Your health care provider may also give you more specific instructions. If you have problems or questions, contact your health care provider. What can I expect after the procedure? If you had a colposcopy without a biopsy, you can expect to feel fine right away after your procedure. However, you may have some spotting of blood for a few days. You can return to your normal activities. If you had a colposcopy with a biopsy, it is common after the procedure to have:  Soreness and mild pain. These may last for a few days.  Light-headedness.  Mild vaginal bleeding or discharge that is dark-colored and grainy. This may last for a few days. The discharge may be caused by a liquid (solution) that was used during the procedure. You may need to wear a sanitary pad during this time.  Spotting of blood for at least 48 hours after the procedure. Follow these instructions at home: Medicines  Take over-the-counter and prescription medicines only as told by your health care provider.  Talk with your health care provider about what type of over-the-counter pain medicine and prescription medicine you can start to take again. It is especially important to talk with your health care provider if you take blood thinners. Activity  Limit your physical activity for the first day after your procedure as told by your health care provider.  Avoid using douche products, using tampons, or having sex for at least 3 days after the procedure or for as long as told.  Return to your normal activities as told by your health care provider. Ask your health care provider what activities are safe for you. General instructions  Drink enough fluid to keep your urine pale yellow.  Ask your health care provider if you may take baths, swim, or use a hot tub. You may take  showers.  If you use birth control (contraception), continue to use it.  Keep all follow-up visits as told by your health care provider. This is important.   Contact a health care provider if:  You develop a skin rash. Get help right away if:  You bleed a lot from your vagina or pass blood clots. This includes using more than one sanitary pad each hour for 2 hours in a row.  You have a fever or chills.  You have vaginal discharge that is abnormal, is yellow in color, or smells bad. This could be a sign of infection.  You have severe pain or cramps in your lower abdomen that do not go away with medicine.  You faint. Summary  If you had a colposcopy without a biopsy, you can expect to feel fine right away, but you may have some spotting of blood for a few days. You can return to your normal activities.  If you had a colposcopy with a biopsy, it is common to have mild pain for a few days and spotting for 48 hours after the procedure.  Avoid using douche products, using tampons, and having sex for at least 3 days after the procedure or for as long as told by your health care provider.  Get help right away if you have heavy bleeding, severe pain, or signs of infection. This information is not intended to replace advice given to you by your health care provider. Make sure you discuss any questions you have with your health care provider. Document Revised: 01/08/2019 Document Reviewed:   01/08/2019 Elsevier Patient Education  2021 Elsevier Inc.  

## 2020-05-18 NOTE — Progress Notes (Signed)
   COLPOSCOPY PROCEDURE NOTE Patient name: [name] Brown MRN [id number]  Date of birth: [birth year] Subjective Findings:   [name] Brown is a 30 y.o. G67P1001 Caucasian female being seen today for a colposcopy. Reports vulvar itching, recent antibiotic use for strep throat.  Indication: Abnormal pap on 05/06/20: ASCUS w/ HRHPV positive  Prior cytology:  Date Result Procedure  2018 NILM w/ HRHPV not done None              Patient's last menstrual period was 05/02/2020 (exact date). Contraception: abstinence. Menopausal: no. Hysterectomy: no.   Smoker: no. Immunocompromised: no.  The risks and benefits were explained and informed consent was obtained, and written copy is in chart. Pertinent History Reviewed:   Reviewed past medical,surgical, social, obstetrical and family history.  Reviewed problem list, medications and allergies. Objective Findings & Procedure:   Vitals:   05/18/20 1557  BP: 122/78  Pulse: 79  Weight: 161 lb (73 kg)  Height: 5\' 4"  (1.626 m)  Body mass index is 27.64 kg/m.  Results for orders placed or performed in visit on 05/18/20 (from the past 24 hour(s))  POCT urine pregnancy   Collection Time: 05/18/20  4:02 PM  Result Value Ref Range   Preg Test, Ur Negative Negative     Time out was performed.  Speculum placed in the vagina, cervix fully visualized. SCJ: fully visualized. Cervix swabbed x 3 with acetic acid.  Acetowhitening present: No Cervix: no visible lesions, no mosaicism, no punctation and no abnormal vasculature. no biopsies taken. Vagina: vaginal colposcopy not performed Vulva: vulvar colposcopy not performed  Specimens: 0  Complications: none  Preceptor: Dr. 05/20/20 Colposcopic Impression & Plan:   Normal colposcopy without lesions Plan: Per ASCCP guidelines, repeat cotesting in 1 years   Vulvar itching> CV swab collected  Return for 1st available, IUD insertion.  Despina Hidden CNM, [facility] 05/18/2020 5:10 PM

## 2020-05-20 LAB — CERVICOVAGINAL ANCILLARY ONLY
Bacterial Vaginitis (gardnerella): NEGATIVE
Candida Glabrata: NEGATIVE
Candida Vaginitis: NEGATIVE
Comment: NEGATIVE
Comment: NEGATIVE
Comment: NEGATIVE

## 2020-05-25 LAB — FUNGUS CULTURE, BLOOD

## 2020-05-27 ENCOUNTER — Other Ambulatory Visit: Payer: Self-pay

## 2020-05-27 ENCOUNTER — Other Ambulatory Visit (HOSPITAL_COMMUNITY)
Admission: RE | Admit: 2020-05-27 | Discharge: 2020-05-27 | Disposition: A | Discharge status: Home / Self Care | Payer: Medicaid Other | Source: Ambulatory Visit | Attending: Obstetrics & Gynecology | Admitting: Obstetrics & Gynecology

## 2020-05-27 ENCOUNTER — Other Ambulatory Visit (INDEPENDENT_AMBULATORY_CARE_PROVIDER_SITE_OTHER): Payer: Self-pay

## 2020-05-27 DIAGNOSIS — R399 Unspecified symptoms and signs involving the genitourinary system: Secondary | ICD-10-CM

## 2020-05-27 DIAGNOSIS — N898 Other specified noninflammatory disorders of vagina: Secondary | ICD-10-CM

## 2020-05-27 LAB — POCT URINALYSIS DIPSTICK OB
Blood, UA: NEGATIVE
Glucose, UA: NEGATIVE
Ketones, UA: NEGATIVE
Nitrite, UA: NEGATIVE
POC,PROTEIN,UA: NEGATIVE

## 2020-05-27 NOTE — Progress Notes (Addendum)
   NURSE VISIT- UTI SYMPTOMS   SUBJECTIVE:  [name] Brown is a 30 y.o. G6P1001 female here for UTI symptoms. She is a GYN patient. She reports urinary urgency.  OBJECTIVE:  LMP 05/02/2020 (Exact Date)   Appears well, in no apparent distress  Results for orders placed or performed in visit on 05/27/20 (from the past 24 hour(s))  POC Urinalysis Dipstick OB   Collection Time: 05/27/20  3:45 PM  Result Value Ref Range   Color, UA     Clarity, UA     Glucose, UA Negative Negative   Bilirubin, UA     Ketones, UA neg    Spec Grav, UA     Blood, UA neg    pH, UA     POC,PROTEIN,UA Negative Negative, Trace, Small (1+), Moderate (2+), Large (3+), 4+   Urobilinogen, UA     Nitrite, UA neg    Leukocytes, UA Small (1+) (A) Negative   Appearance     Odor      ASSESSMENT: GYN patient with UTI symptoms and negative nitrites  PLAN: Note routed to Cathie Beams, CNM   Rx sent by provider today: No Urine culture sent Call or return to clinic prn if these symptoms worsen or fail to improve as anticipated. Follow-up: as needed    NURSE VISIT- VAGINITIS/STD/POC  SUBJECTIVE:  [name] Brown is a 30 y.o. G1P1001 GYN patientfemale here for a vaginal swab for vaginitis screening, STD screen.  She reports the following symptoms: discharge described as white and vulvar itching for 3 weeks. Denies abnormal vaginal bleeding, significant pelvic pain, fever, or UTI symptoms.  OBJECTIVE:  LMP 05/02/2020 (Exact Date)   Appears well, in no apparent distress  ASSESSMENT: Vaginal swab for vaginitis & STD screening  PLAN: Self-collected vaginal probe for Gonorrhea, Chlamydia, Trichomonas, Bacterial Vaginosis, Yeast sent to lab Treatment: to be determined once results are received Follow-up as needed if symptoms persist/worsen, or new symptoms develop  Bharath Bernstein A Selicia Windom  05/27/2020 3:45 PM   Chart reviewed for nurse visit. Agree with plan of care.  Jacklyn Shell, [place] 05/27/2020  7:52 PM

## 2020-05-30 LAB — URINE CULTURE

## 2020-05-31 LAB — CERVICOVAGINAL ANCILLARY ONLY
Bacterial Vaginitis (gardnerella): POSITIVE — AB
Candida Glabrata: NEGATIVE
Candida Vaginitis: POSITIVE — AB
Chlamydia: NEGATIVE
Comment: NEGATIVE
Comment: NEGATIVE
Comment: NEGATIVE
Comment: NEGATIVE
Comment: NEGATIVE
Comment: NORMAL
Neisseria Gonorrhea: NEGATIVE
Trichomonas: NEGATIVE

## 2020-06-02 ENCOUNTER — Other Ambulatory Visit: Payer: Self-pay | Admitting: Advanced Practice Midwife

## 2020-06-02 MED ORDER — FLUCONAZOLE 150 MG PO TABS: ORAL_TABLET | ORAL | 2 refills | Status: DC

## 2020-06-02 MED ORDER — METRONIDAZOLE 500 MG PO TABS: 500.0000 mg | ORAL_TABLET | Freq: Two times a day (BID) | ORAL | 0 refills | Status: DC

## 2020-06-02 NOTE — Progress Notes (Signed)
Flagyl and diflucan + swab

## 2020-06-15 ENCOUNTER — Ambulatory Visit: Payer: Medicaid Other | Admitting: Women's Health

## 2020-06-26 ENCOUNTER — Ambulatory Visit
Admission: EM | Admit: 2020-06-26 | Discharge: 2020-06-26 | Disposition: A | Discharge status: Home / Self Care | Payer: Medicaid Other | Attending: Emergency Medicine | Admitting: Emergency Medicine

## 2020-06-26 ENCOUNTER — Other Ambulatory Visit: Payer: Self-pay

## 2020-06-26 DIAGNOSIS — N898 Other specified noninflammatory disorders of vagina: Secondary | ICD-10-CM

## 2020-06-26 MED ORDER — METRONIDAZOLE 500 MG PO TABS: 500.0000 mg | ORAL_TABLET | Freq: Two times a day (BID) | ORAL | 0 refills | Status: DC

## 2020-06-26 MED ORDER — FLUCONAZOLE 150 MG PO TABS: 150.0000 mg | ORAL_TABLET | Freq: Every day | ORAL | 0 refills | Status: DC

## 2020-06-26 NOTE — ED Provider Notes (Signed)
[facility]   [id number] 06/26/20 Arrival Time: 4332   Chief Complaint  Patient presents with  . Vaginal Itching    SUBJECTIVE:  [name] Brown is a 30 y.o. female who presents to the urgent care for complaint of vaginal irritation and itching for the past few days.  Denies any sexual encounter.  Has a history of BV and yeast infection.  Reports on white thick vaginal discharge.  Last unprotected sexual encounter within few weeks.  Sexually active with 1 female  partner.  Denies similar symptoms in the past.  Denies fever, chills, nausea, vomiting, abdominal or pelvic pain, urinary symptoms, vaginal itching, vaginal odor, vaginal bleeding, dyspareunia, vaginal rashes or lesions.   Patient's last menstrual period was 06/04/2020 (approximate).  ROS: As per HPI.  All other pertinent ROS negative.     Past Medical History:  Diagnosis Date  . 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/12/2020   05/12/20 pt aware and needs colpo per ASCCP guidelines, has immediate risk of CIN 3+ of 4.4 %, will get colpo, before getting IUD placed   . Chronic abdominal pain   . Colitis   . Counseling for birth control, oral contraceptives 06/26/2014  . Medical history non-contributory   . Missed period 06/26/2014   Past Surgical History:  Procedure Laterality Date  . CESAREAN SECTION N/A 04/13/2017   Procedure: CESAREAN SECTION;  Surgeon: Hutto Bing, MD;  Location: [facility];  Service: Obstetrics;  Laterality: N/A;  . NO PAST SURGERIES     Allergies  Allergen Reactions  . Mineral Oil Rash    Baby oil   No current facility-administered medications on file prior to encounter.   Current Outpatient Medications on File Prior to Encounter  Medication Sig Dispense Refill  . escitalopram (LEXAPRO) 10 MG tablet Take 1 tablet (10 mg total) by mouth daily. (Patient not taking: No sig reported) 30 tablet 6   Social History   Socioeconomic History  . Marital status: Single    Spouse  name: Not on file  . Number of children: 1  . Years of education: Not on file  . Highest education level: Not on file  Occupational History  . Not on file  Tobacco Use  . Smoking status: Never Smoker  . Smokeless tobacco: Never Used  Vaping Use  . Vaping Use: Never used  Substance and Sexual Activity  . Alcohol use: Yes    Comment: occ  . Drug use: No  . Sexual activity: Not Currently    Birth control/protection: None  Other Topics Concern  . Not on file  Social History Narrative  . Not on file   Social Determinants of Health   Financial Resource Strain: Not on file  Food Insecurity: Not on file  Transportation Needs: Not on file  Physical Activity: Not on file  Stress: Not on file  Social Connections: Not on file  Intimate Partner Violence: Not on file   Family History  Problem Relation Age of Onset  . Cancer Mother        cervical  . Alcohol abuse Mother   . Ovarian cysts Mother   . Other Father        fatty liver  . Cancer Maternal Grandmother        skin  . Dementia Maternal Grandmother   . Dementia Paternal Grandmother   . Diabetes Paternal Grandfather   . Dementia Paternal Grandfather   . Ovarian cysts Sister   . Ovarian cysts Sister   .  Jaundice Daughter     OBJECTIVE:  Vitals:   06/26/20 0838  BP: 117/79  Pulse: 82  Resp: 20  Temp: 98.4 F (36.9 C)  SpO2: 98%     General appearance: alert, NAD, appears stated age Head: NCAT Throat: lips, mucosa, and tongue normal; teeth and gums normal Lungs: CTA bilaterally without adventitious breath sounds Heart: regular rate and rhythm.  Radial pulses 2+ symmetrical bilaterally Back: no CVA tenderness Abdomen: soft, non-tender; bowel sounds normal; no masses or organomegaly; no guarding or rebound tenderness GU: deferred Skin: warm and dry Psychological:  Alert and cooperative. Normal mood and affect.  LABS:  Results for orders placed or performed in visit on 05/27/20  Urine Culture   Specimen:  Urine   UR  Result Value Ref Range   Urine Culture, Routine Final report    Organism ID, Bacteria Comment   POC Urinalysis Dipstick OB  Result Value Ref Range   Color, UA     Clarity, UA     Glucose, UA Negative Negative   Bilirubin, UA     Ketones, UA neg    Spec Grav, UA     Blood, UA neg    pH, UA     POC,PROTEIN,UA Negative Negative, Trace, Small (1+), Moderate (2+), Large (3+), 4+   Urobilinogen, UA     Nitrite, UA neg    Leukocytes, UA Small (1+) (A) Negative   Appearance     Odor    Cervicovaginal ancillary only( Jermyn)  Result Value Ref Range   Neisseria Gonorrhea Negative    Chlamydia Negative    Trichomonas Negative    Bacterial Vaginitis (gardnerella) Positive (A)    Candida Vaginitis Positive (A)    Candida Glabrata Negative    Comment Normal Reference Range Candida Species - Negative    Comment Normal Reference Range Candida Galbrata - Negative    Comment Normal Reference Range Trichomonas - Negative    Comment      Normal Reference Range Bacterial Vaginosis - Negative   Comment Normal Reference Ranger Chlamydia - Negative    Comment      Normal Reference Range Neisseria Gonorrhea - Negative    Labs Reviewed - No data to display  ASSESSMENT & PLAN:  1. Vaginal irritation   2. Vaginal itching     Meds ordered this encounter  Medications  . metroNIDAZOLE (FLAGYL) 500 MG tablet    Sig: Take 1 tablet (500 mg total) by mouth 2 (two) times daily.    Dispense:  14 tablet    Refill:  0  . fluconazole (DIFLUCAN) 150 MG tablet    Sig: Take 1 tablet (150 mg total) by mouth daily. May repeat 72 hours after the first if symptom does not resolve    Dispense:  2 tablet    Refill:  0    Pending: Labs Reviewed - No data to display   Discharge instructions  Prescribed metronidazole 500 mg twice daily for 7 days (do not take while consuming alcohol) Prescribed diflucan 200 mg once daily and then second dose 72 hours later Take medications as  prescribed and to completion We will follow up with you regarding the results of your test If tests are positive, please abstain from sexual activity until you and your partner(s) are treated Follow up with PCP or [facility] if symptoms persists Return here or go to ER if you have any new or worsening symptoms    Reviewed expectations re: course of current medical  issues. Questions answered. Outlined signs and symptoms indicating need for more acute intervention. Patient verbalized understanding. After Visit Summary given.       Durward Parcel, FNP 06/26/20 [phone]

## 2020-06-26 NOTE — Discharge Instructions (Signed)
Prescribed metronidazole 500 mg twice daily for 7 days (do not take while consuming alcohol) Prescribed diflucan 200 mg once daily and then second dose 72 hours later Take medications as prescribed and to completion We will follow up with you regarding the results of your test If tests are positive, please abstain from sexual activity until you and your partner(s) are treated Follow up with PCP or [facility] if symptoms persists Return here or go to ER if you have any new or worsening symptoms

## 2020-06-26 NOTE — ED Triage Notes (Signed)
Pt presents with c/o vaginal irritation . Pt was treated for BV and yeast infection last month and then developed symptoms again, not concerned with std

## 2020-07-16 ENCOUNTER — Other Ambulatory Visit (INDEPENDENT_AMBULATORY_CARE_PROVIDER_SITE_OTHER): Payer: Medicaid Other

## 2020-07-16 ENCOUNTER — Other Ambulatory Visit: Payer: Self-pay

## 2020-07-16 ENCOUNTER — Other Ambulatory Visit (HOSPITAL_COMMUNITY)
Admission: RE | Admit: 2020-07-16 | Discharge: 2020-07-16 | Disposition: A | Discharge status: Home / Self Care | Payer: Medicaid Other | Source: Ambulatory Visit | Attending: Obstetrics & Gynecology | Admitting: Obstetrics & Gynecology

## 2020-07-16 ENCOUNTER — Other Ambulatory Visit: Payer: Medicaid Other

## 2020-07-16 DIAGNOSIS — Z113 Encounter for screening for infections with a predominantly sexual mode of transmission: Secondary | ICD-10-CM | POA: Diagnosis present

## 2020-07-16 NOTE — Progress Notes (Addendum)
   NURSE VISIT- VAGINITIS/STD/POC  SUBJECTIVE:  [name] Brown is a 30 y.o. G1P1001 GYN patientfemale here for a vaginal swab for vaginitis screening, STD screen.  She reports the following symptoms: discharge described as creamy and vulvar itching for 1 week. Denies abnormal vaginal bleeding, significant pelvic pain, fever, or UTI symptoms.  OBJECTIVE:  There were no vitals taken for this visit.  Appears well, in no apparent distress  ASSESSMENT: Vaginal swab for  vaginitis & STD screening  PLAN: Self-collected vaginal probe for Gonorrhea, Chlamydia, Trichomonas, Bacterial Vaginosis, Yeast sent to lab Treatment: to be determined once results are received Follow-up as needed if symptoms persist/worsen, or new symptoms develop  Jameson Tormey A Simmie Garin  07/16/2020 12:08 PM   Chart reviewed for nurse visit. Agree with plan of care.  Cheral Marker, [place] 07/16/2020 1:30 PM

## 2020-07-17 LAB — HSV 2 ANTIBODY, IGG: HSV 2 IgG, Type Spec: <0.91 index (ref 0.00–0.90)

## 2020-07-20 LAB — CERVICOVAGINAL ANCILLARY ONLY
Bacterial Vaginitis (gardnerella): NEGATIVE
Candida Glabrata: NEGATIVE
Candida Vaginitis: NEGATIVE
Chlamydia: NEGATIVE
Comment: NEGATIVE
Comment: NEGATIVE
Comment: NEGATIVE
Comment: NEGATIVE
Comment: NEGATIVE
Comment: NORMAL
Neisseria Gonorrhea: NEGATIVE
Trichomonas: NEGATIVE

## 2020-08-02 ENCOUNTER — Ambulatory Visit: Payer: Medicaid Other | Admitting: Women's Health

## 2020-09-05 ENCOUNTER — Ambulatory Visit
Admission: EM | Admit: 2020-09-05 | Discharge: 2020-09-05 | Disposition: A | Discharge status: Home / Self Care | Payer: Medicaid Other | Attending: Family Medicine | Admitting: Family Medicine

## 2020-09-05 ENCOUNTER — Ambulatory Visit: Admit: 2020-09-05 | Disposition: A | Discharge status: Home / Self Care | Payer: MEDICAID

## 2020-09-05 ENCOUNTER — Encounter: Payer: Self-pay | Admitting: Emergency Medicine

## 2020-09-05 DIAGNOSIS — N898 Other specified noninflammatory disorders of vagina: Secondary | ICD-10-CM

## 2020-09-05 DIAGNOSIS — R300 Dysuria: Secondary | ICD-10-CM

## 2020-09-05 LAB — POCT URINALYSIS DIP (MANUAL ENTRY)
Bilirubin, UA: NEGATIVE
Blood, UA: NEGATIVE
Glucose, UA: NEGATIVE mg/dL
Ketones, POC UA: NEGATIVE mg/dL
Leukocytes, UA: NEGATIVE
Nitrite, UA: NEGATIVE
Protein Ur, POC: NEGATIVE mg/dL
Spec Grav, UA: 1.015 (ref 1.010–1.025)
Urobilinogen, UA: 0.2 E.U./dL
pH, UA: 7 (ref 5.0–8.0)

## 2020-09-05 NOTE — ED Triage Notes (Signed)
Painful urination x 2 days.  Pt also states she wants hsv blood work done.  States she has had a lesion in the past tested and it came back neg.  Had another lesion come up recently but it is almost gone now.

## 2020-09-05 NOTE — Discharge Instructions (Addendum)
Suspect lesion that has resolved was related to an ingrown hair.  Your exam today is normal. If any lesion re-erupt, return or follow-up with OBGYN as we can test with swab.

## 2020-09-05 NOTE — ED Provider Notes (Signed)
[facility]    CSN: [id number] Arrival date & time: 09/05/20  1129      History   Chief Complaint No chief complaint on file.   HPI [name] Brown is a 30 y.o. female.   HPI Patient presents with two days of painful urination. She is also concerned for HSV as she had a painful pustular bump in her left lower groin region which has since resolved. She had a reactive HSV test in past and subsequent serum HSV has been non-reactive.  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   Nail abnormality 05/06/2020   Depression with anxiety 05/06/2020   Bacteremia due to Escherichia coli 05/05/2019   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/2017   Status post primary low transverse cesarean section 04/14/2017   BV (bacterial vaginosis) 09/11/2016   Abdominal pain 04/22/2014    Past Surgical History:  Procedure Laterality Date   CESAREAN SECTION N/A 04/13/2017   Procedure: CESAREAN SECTION;  Surgeon: Shadeland Bing, MD;  Location: [facility];  Service: Obstetrics;  Laterality: N/A;   NO PAST SURGERIES      OB History     Gravida  1   Para  1   Term  1   Preterm  0   AB  0   Living  1      SAB  0   IAB  0   Ectopic  0   Multiple  0   Live Births  1            Home Medications    Prior to Admission medications   Medication Sig Start Date End Date Taking? Authorizing Provider  fluconazole (DIFLUCAN) 150 MG tablet  Take 1 tablet (150 mg total) by mouth daily. May repeat 72 hours after the first if symptom does not resolve 06/26/20   Avegno, Zachery Dakins, FNP  metroNIDAZOLE (FLAGYL) 500 MG tablet Take 1 tablet (500 mg total) by mouth 2 (two) times daily. 06/26/20   Avegno, Zachery Dakins, FNP    Family History Family History  Problem Relation Age of Onset   Cancer Mother        cervical   Alcohol abuse Mother    Ovarian cysts Mother    Other Father        fatty liver   Cancer Maternal Grandmother        skin   Dementia Maternal Grandmother    Dementia Paternal Grandmother    Diabetes Paternal Grandfather    Dementia Paternal Grandfather    Ovarian cysts Sister    Ovarian cysts Sister    Jaundice Daughter     Social History Social History   Tobacco Use   Smoking status: Never   Smokeless tobacco: Never  Vaping Use   Vaping Use: Never used  Substance Use Topics   Alcohol use: Yes    Comment: occ   Drug use: No  Allergies   Mineral oil   Review of Systems Review of Systems Pertinent negatives listed in HPI   Physical Exam Triage Vital Signs ED Triage Vitals  Enc Vitals Group     BP 09/05/20 1420 113/76     Pulse Rate 09/05/20 1420 83     Resp 09/05/20 1420 17     Temp 09/05/20 1420 98 F (36.7 C)     Temp Source 09/05/20 1420 Oral     SpO2 09/05/20 1420 96 %     Weight --      Height --      Head Circumference --      Peak Flow --      Pain Score 09/05/20 1421 0     Pain Loc --      Pain Edu? --      Excl. in GC? --    No data found.  Updated Vital Signs BP 113/76 (BP Location: Right Arm)   Pulse 83   Temp 98 F (36.7 C) (Oral)   Resp 17   LMP 08/31/2020 (Approximate)   SpO2 96%   Visual Acuity Right Eye Distance:   Left Eye Distance:   Bilateral Distance:    Right Eye Near:   Left Eye Near:    Bilateral Near:     Physical Exam General appearance: Alert, well developed, well nourished, cooperative  Head: Normocephalic, without obvious abnormality,  atraumatic Respiratory: Respirations even and unlabored, normal respiratory rate Heart: Rate and rhythm normal. No gallop or murmurs noted on exam  Extremities: No gross deformities Skin: Skin color, texture, turgor normal. No rashes seen  Psych: Appropriate mood and affect. Vaginal exam unremarkable (external) no adenopathy or  appearance of recent lesion UC Treatments / Results  Labs (all labs ordered are listed, but only abnormal results are displayed) Labs Reviewed  POCT URINALYSIS DIP (MANUAL ENTRY)    EKG   Radiology No results found.  Procedures Procedures (including critical care time)  Medications Ordered in UC Medications - No data to display  Initial Impression / Assessment and Plan / UC Course  I have reviewed the triage vital signs and the nursing notes.  Pertinent labs & imaging results that were available during my care of the patient were reviewed by me and considered in my medical decision making (see chart for details).    UA unremarkable. External vaginal negative for lesions, boils or cyst, or adenopathy. No lesion available to unroof. Recommended return for testing if lesion reappears, however educated on appearance of vesicular lesions vs follicular lesion in that vesicular lesion are more consistent with HSV . Patient will continue follow-up with PCP as needed. Final Clinical Impressions(s) / UC Diagnoses   Final diagnoses:  None   Discharge Instructions   None    ED Prescriptions   None    PDMP not reviewed this encounter.   Bing Neighbors, FNP 09/08/20 [phone]

## 2020-10-01 ENCOUNTER — Encounter: Payer: Self-pay | Admitting: Emergency Medicine

## 2020-10-01 ENCOUNTER — Ambulatory Visit
Admission: EM | Admit: 2020-10-01 | Discharge: 2020-10-01 | Disposition: A | Discharge status: Home / Self Care | Payer: Self-pay | Attending: Emergency Medicine | Admitting: Emergency Medicine

## 2020-10-01 ENCOUNTER — Other Ambulatory Visit: Payer: Self-pay

## 2020-10-01 DIAGNOSIS — N898 Other specified noninflammatory disorders of vagina: Secondary | ICD-10-CM

## 2020-10-01 NOTE — ED Provider Notes (Signed)
[facility]   [id number] 10/01/20 Arrival Time: 1854   CB:[id number] odor  SUBJECTIVE:  [name] Brown is a 30 y.o. female who presents with complaint of vaginal odor x 6 days.  States she had a tampon in for too long and then removed it at home.  Patient denies concern for retained FB.  Started to have a foul odor shortly after tampon removal.  Denies pain and discharge.  She reports similar symptoms in the past.  She denies fever, chills, nausea, vomiting, abdominal or pelvic pain, urinary symptoms, vaginal itching, vaginal bleeding, dyspareunia, vaginal rashes or lesions.   Patient's last menstrual period was 09/23/2020 (exact date).  ROS: As per HPI.  All other pertinent ROS negative.     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   05/12/20 pt aware and needs colpo per ASCCP guidelines, has immediate risk of CIN 3+ of 4.4 %, will get colpo, before getting IUD placed    Chronic abdominal pain    Colitis    Counseling for birth control, oral contraceptives 06/26/2014   Medical history non-contributory    Missed period 06/26/2014   Past Surgical History:  Procedure Laterality Date   CESAREAN SECTION N/A 04/13/2017   Procedure: CESAREAN SECTION;  Surgeon: Nyssa Bing, MD;  Location: [facility]TH[facility] SUIT[facility];  Service: Obstetrics;  Laterality: N/A;   NO PAST SURGERIES     Allergies  Allergen Reactions   Mineral Oil Rash    Baby oil   No current facility-administered medications on file prior to encounter.   Current Outpatient Medications on File Prior to Encounter  Medication Sig Dispense Refill   fluconazole (DIFLUCAN) 150 MG tablet Take 1 tablet (150 mg total) by mouth daily. May repeat 72 hours after the first if symptom does not resolve 2 tablet 0   metroNIDAZOLE (FLAGYL) 500 MG tablet Take 1 tablet (500 mg total) by mouth 2 (two) times daily. 14 tablet 0    Social History   Socioeconomic History   Marital status:  Single    Spouse name: Not on file   Number of children: 1   Years of education: Not on file   Highest education level: Not on file  Occupational History   Not on file  Tobacco Use   Smoking status: Never   Smokeless tobacco: Never  Vaping Use   Vaping Use: Never used  Substance and Sexual Activity   Alcohol use: Yes    Comment: occ   Drug use: No   Sexual activity: Not Currently    Birth control/protection: None  Other Topics Concern   Not on file  Social History Narrative   Not on file   Social Determinants of Health   Financial Resource Strain: Not on file  Food Insecurity: Not on file  Transportation Needs: Not on file  Physical Activity: Not on file  Stress: Not on file  Social Connections: Not on file  Intimate Partner Violence: Not on file   Family History  Problem Relation Age of Onset   Cancer Mother        cervical   Alcohol abuse Mother    Ovarian cysts Mother    Other Father        fatty liver   Cancer Maternal Grandmother        skin   Dementia Maternal Grandmother    Dementia Paternal Grandmother    Diabetes Paternal Grandfather    Dementia Paternal Grandfather  Ovarian cysts Sister    Ovarian cysts Sister    Jaundice Daughter     OBJECTIVE:  Vitals:   10/01/20 1858  BP: (!) 135/96  Pulse: 77  Resp: 18  Temp: 98.3 F (36.8 C)  TempSrc: Oral  SpO2: 98%     General appearance: Alert, NAD, appears stated age Head: NCAT Throat: lips, mucosa, and tongue normal; teeth and gums normal Lungs: CTA bilaterally without adventitious breath sounds Heart: regular rate and rhythm.   Abdomen: soft, non-tender; bowel sounds normal; no guarding GU: Declines Skin: warm and dry Psychological:  Alert and cooperative. Normal mood and affect.  LABS:   Labs Reviewed  CERVICOVAGINAL ANCILLARY ONLY    ASSESSMENT & PLAN:  1. Vaginal odor    Pending: Labs Reviewed  CERVICOVAGINAL ANCILLARY ONLY    Vaginal self-swab obtained.  We will  follow up with you regarding abnormal results If tests results are positive, please abstain from sexual activity until you and your partner(s) have been treated Follow up with PCP Return here or go to ER if you have any new or worsening symptoms fever, chills, nausea, vomiting, abdominal or pelvic pain, painful intercourse, vaginal discharge, vaginal bleeding, persistent symptoms despite treatment, etc...  Reviewed expectations re: course of current medical issues. Questions answered. Outlined signs and symptoms indicating need for more acute intervention. Patient verbalized understanding. After Visit Summary given.        Rennis Harding, PA-C 10/01/20 1916

## 2020-10-01 NOTE — ED Triage Notes (Signed)
States she thinks she left a tampon in too long and has a foul odor since Saturday.

## 2020-10-04 LAB — CERVICOVAGINAL ANCILLARY ONLY
Bacterial Vaginitis (gardnerella): POSITIVE — AB
Candida Glabrata: NEGATIVE
Candida Vaginitis: NEGATIVE
Chlamydia: NEGATIVE
Comment: NEGATIVE
Comment: NEGATIVE
Comment: NEGATIVE
Comment: NEGATIVE
Comment: NEGATIVE
Comment: NORMAL
Neisseria Gonorrhea: NEGATIVE
Trichomonas: NEGATIVE

## 2020-10-04 MED ORDER — METRONIDAZOLE 500 MG PO TABS: 500.0000 mg | ORAL_TABLET | Freq: Two times a day (BID) | ORAL | 0 refills | Status: DC

## 2020-10-11 ENCOUNTER — Other Ambulatory Visit: Payer: Self-pay | Admitting: Adult Health

## 2020-10-11 DIAGNOSIS — Z113 Encounter for screening for infections with a predominantly sexual mode of transmission: Secondary | ICD-10-CM

## 2020-10-11 NOTE — Progress Notes (Signed)
Recheck HSV 2 antibody

## 2020-10-12 ENCOUNTER — Ambulatory Visit
Admission: EM | Admit: 2020-10-12 | Discharge: 2020-10-12 | Disposition: A | Discharge status: Home / Self Care | Payer: Medicaid Other | Attending: Family Medicine | Admitting: Family Medicine

## 2020-10-12 ENCOUNTER — Other Ambulatory Visit: Payer: Self-pay

## 2020-10-12 DIAGNOSIS — J029 Acute pharyngitis, unspecified: Secondary | ICD-10-CM | POA: Insufficient documentation

## 2020-10-12 LAB — POCT RAPID STREP A (OFFICE): Rapid Strep A Screen: NEGATIVE

## 2020-10-12 MED ORDER — AMOXICILLIN 875 MG PO TABS: 875.0000 mg | ORAL_TABLET | Freq: Two times a day (BID) | ORAL | 0 refills | Status: AC

## 2020-10-12 MED ORDER — FLUCONAZOLE 150 MG PO TABS: ORAL_TABLET | ORAL | 0 refills | Status: DC

## 2020-10-12 NOTE — ED Provider Notes (Signed)
[facility]   [id number] 10/12/20 Arrival Time: 9937  ASSESSMENT & PLAN:  1. Sore throat    No signs of peritonsillar abscess. Discussed.  Given presentation and exam will tx for strep throat.  Meds ordered this encounter  Medications   fluconazole (DIFLUCAN) 150 MG tablet    Sig: Take one tablet by mouth as a single dose. May repeat in 3 days if symptoms persist.    Dispense:  2 tablet    Refill:  0   amoxicillin (AMOXIL) 875 MG tablet    Sig: Take 1 tablet (875 mg total) by mouth 2 (two) times daily for 10 days.    Dispense:  20 tablet    Refill:  0   Rx fluconazole for reported yeast infection after taking Flagyl last week.  Results for orders placed or performed during the hospital encounter of 10/12/20  POCT rapid strep A  Result Value Ref Range   Rapid Strep A Screen Negative Negative   Throat culture sent.  Instructed to finish full 10 day course of antibiotics. Will follow up if not showing significant improvement over the next 24-48 hours.    Discharge Instructions      You may use over the counter ibuprofen or acetaminophen as needed.  For a sore throat, over the counter products such as Colgate Peroxyl Mouth Sore Rinse or Chloraseptic Sore Throat Spray may provide some temporary relief. Your rapid strep test was negative today. We have sent your throat swab for culture and will let you know of any positive results.    Reviewed expectations re: course of current medical issues. Questions answered. Outlined signs and symptoms indicating need for more acute intervention. Patient verbalized understanding. After Visit Summary given.   SUBJECTIVE:  [name] Brown is a 30 y.o. female who reports a sore throat. Describes as sharp pain, esp when swallowing. Onset abrupt beginning yesterday. Symptoms have gradually worsened since beginning; without voice changes. No respiratory symptoms. Normal PO intake but reports discomfort with swallowing. No  specific alleviating factors. Fever: believed to be present, temp not taken. No neck pain or swelling. No associated nausea, vomiting, or abdominal pain. Known sick contacts: none. Recent travel: none. OTC treatment: none PTA.  OBJECTIVE:  Vitals:   10/12/20 1021  BP: 122/75  Pulse: (!) 105  Resp: 18  Temp: 99.6 F (37.6 C)  SpO2: 97%    Mild tachycardia noted. General appearance: alert; no distress HEENT: throat with marked erythema and exudative tonsillary hypertrophy; uvula is midline Neck: supple with FROM; no lymphadenopathy Lungs: speaks full sentences without difficulty; unlabored Abd: soft; non-tender Skin: reveals no rash; warm and dry Psychological: alert and cooperative; normal mood and affect  Allergies  Allergen Reactions   Mineral Oil Rash    Baby oil    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   05/12/20 pt aware and needs colpo per ASCCP guidelines, has immediate risk of CIN 3+ of 4.4 %, will get colpo, before getting IUD placed    Chronic abdominal pain    Colitis    Counseling for birth control, oral contraceptives 06/26/2014   Medical history non-contributory    Missed period 06/26/2014   Social History   Socioeconomic History   Marital status: Single    Spouse name: Not on file   Number of children: 1   Years of education: Not on file   Highest education level: Not on file  Occupational History  Not on file  Tobacco Use   Smoking status: Never   Smokeless tobacco: Never  Vaping Use   Vaping Use: Never used  Substance and Sexual Activity   Alcohol use: Yes    Comment: occ   Drug use: No   Sexual activity: Not Currently    Birth control/protection: None  Other Topics Concern   Not on file  Social History Narrative   Not on file   Social Determinants of Health   Financial Resource Strain: Not on file  Food Insecurity: Not on file  Transportation Needs: Not on file  Physical Activity:  Not on file  Stress: Not on file  Social Connections: Not on file  Intimate Partner Violence: Not on file   Family History  Problem Relation Age of Onset   Cancer Mother        cervical   Alcohol abuse Mother    Ovarian cysts Mother    Other Father        fatty liver   Cancer Maternal Grandmother        skin   Dementia Maternal Grandmother    Dementia Paternal Grandmother    Diabetes Paternal Grandfather    Dementia Paternal Grandfather    Ovarian cysts Sister    Ovarian cysts Sister    Jaundice Daughter            Mardella Layman, MD 10/12/20 1050

## 2020-10-12 NOTE — Discharge Instructions (Addendum)
You may use over the counter ibuprofen or acetaminophen as needed.  For a sore throat, over the counter products such as Colgate Peroxyl Mouth Sore Rinse or Chloraseptic Sore Throat Spray may provide some temporary relief. Your rapid strep test was negative today. We have sent your throat swab for culture and will let you know of any positive results. 

## 2020-10-12 NOTE — ED Triage Notes (Signed)
Pt presents with sore throat and headache that began yesterday, pt also reports yeast infection after taking antibiotic for BV

## 2020-10-13 ENCOUNTER — Telehealth: Payer: Self-pay | Admitting: Emergency Medicine

## 2020-10-13 MED ORDER — PREDNISONE 50 MG PO TABS: 50.0000 mg | ORAL_TABLET | Freq: Every day | ORAL | 0 refills | Status: AC

## 2020-10-14 LAB — HSV 2 ANTIBODY, IGG: HSV 2 IgG, Type Spec: <0.91 index (ref 0.00–0.90)

## 2020-10-15 LAB — CULTURE, GROUP A STREP (THRC)

## 2020-11-13 ENCOUNTER — Ambulatory Visit
Admission: EM | Admit: 2020-11-13 | Discharge: 2020-11-13 | Disposition: A | Discharge status: Home / Self Care | Payer: Medicaid Other | Attending: Family Medicine | Admitting: Family Medicine

## 2020-11-13 ENCOUNTER — Other Ambulatory Visit: Payer: Self-pay

## 2020-11-13 ENCOUNTER — Encounter: Payer: Self-pay | Admitting: Emergency Medicine

## 2020-11-13 DIAGNOSIS — N898 Other specified noninflammatory disorders of vagina: Secondary | ICD-10-CM

## 2020-11-13 MED ORDER — METRONIDAZOLE 500 MG PO TABS: 500.0000 mg | ORAL_TABLET | Freq: Two times a day (BID) | ORAL | 0 refills | Status: DC

## 2020-11-13 MED ORDER — FLUCONAZOLE 150 MG PO TABS: ORAL_TABLET | ORAL | 0 refills | Status: DC

## 2020-11-13 NOTE — Discharge Instructions (Signed)
We have sent testing for causes of vaginal infections. We will notify you of any positive results once they are received. If required, we will prescribe any medications you might need.  Please refrain from all sexual activity for at least the next seven days.  

## 2020-11-13 NOTE — ED Provider Notes (Signed)
[facility]   [id number] 11/13/20 Arrival Time: 1026  ASSESSMENT & PLAN:  1. Vaginal discharge   2. Vaginal irritation       Discharge Instructions      We have sent testing for causes of vaginal infections. We will notify you of any positive results once they are received. If required, we will prescribe any medications you might need.  Please refrain from all sexual activity for at least the next seven days.     Without s/s of PID.  Labs Reviewed  CERVICOVAGINAL ANCILLARY ONLY   Begin: Meds ordered this encounter  Medications   metroNIDAZOLE (FLAGYL) 500 MG tablet    Sig: Take 1 tablet (500 mg total) by mouth 2 (two) times daily.    Dispense:  14 tablet    Refill:  0   fluconazole (DIFLUCAN) 150 MG tablet    Sig: Take one tablet by mouth as a single dose. May repeat in 3 days if symptoms persist.    Dispense:  2 tablet    Refill:  0    Will notify of any positive results. Instructed to refrain from sexual activity for at least seven days.  Reviewed expectations re: course of current medical issues. Questions answered. Outlined signs and symptoms indicating need for more acute intervention. Patient verbalized understanding. After Visit Summary given.   SUBJECTIVE:  [name] Brown is a 30 y.o. female who presents with complaint of vaginal discharge/irritation/itching. Onset gradual. First noticed  2-3 d . Mild odor. No specific aggravating or alleviating factors reported. Denies: urinary frequency, dysuria, and gross hematuria. Afebrile. No abdominal or pelvic pain. Normal PO intake wihout n/v. No genital rashes or lesions. Reports that she is not too worried regarding STI. Patient's last menstrual period was 10/25/2020 (approximate).   OBJECTIVE:  Vitals:   11/13/20 1052  BP: 121/78  Pulse: 93  Resp: 12  Temp: 98.7 F (37.1 C)  TempSrc: Oral  SpO2: 98%     General appearance: alert, cooperative, appears stated age and no distress Lungs:  unlabored respirations; speaks full sentences without difficulty Back: no CVA tenderness; FROM at waist Abdomen: soft, non-tender GU: deferred Skin: warm and dry Psychological: alert and cooperative; normal mood and affect.    Labs Reviewed  CERVICOVAGINAL ANCILLARY ONLY    Allergies  Allergen Reactions   Latex Other (See Comments)    " Burning and skin swells up"   Mineral Oil Rash    Baby oil    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   05/12/20 pt aware and needs colpo per ASCCP guidelines, has immediate risk of CIN 3+ of 4.4 %, will get colpo, before getting IUD placed    Chronic abdominal pain    Colitis    Counseling for birth control, oral contraceptives 06/26/2014   Medical history non-contributory    Missed period 06/26/2014   Family History  Problem Relation Age of Onset   Cancer Mother        cervical   Alcohol abuse Mother    Ovarian cysts Mother    Other Father        fatty liver   Cancer Maternal Grandmother        skin   Dementia Maternal Grandmother    Dementia Paternal Grandmother    Diabetes Paternal Grandfather    Dementia Paternal Grandfather    Ovarian cysts Sister    Ovarian cysts Sister    Jaundice Daughter  Social History   Socioeconomic History   Marital status: Single    Spouse name: Not on file   Number of children: 1   Years of education: Not on file   Highest education level: Not on file  Occupational History   Not on file  Tobacco Use   Smoking status: Never   Smokeless tobacco: Never  Vaping Use   Vaping Use: Never used  Substance and Sexual Activity   Alcohol use: Yes    Comment: occ   Drug use: No   Sexual activity: Not Currently    Birth control/protection: None  Other Topics Concern   Not on file  Social History Narrative   Not on file   Social Determinants of Health   Financial Resource Strain: Not on file  Food Insecurity: Not on file  Transportation  Needs: Not on file  Physical Activity: Not on file  Stress: Not on file  Social Connections: Not on file  Intimate Partner Violence: Not on file           Mardella Layman, MD 11/13/20 1117

## 2020-11-13 NOTE — ED Triage Notes (Signed)
Patient c/o vaginal discharge x 2-3 days ago.   Patient endorses " I have a white thick discharge, but not clumpy, doesn't smell normal".   Patient endorses " a little itchiness".   Patient is not concerned about potential STD.   Patients hasn't used any medications for symptoms.

## 2020-11-15 LAB — CERVICOVAGINAL ANCILLARY ONLY
Bacterial Vaginitis (gardnerella): NEGATIVE
Candida Glabrata: NEGATIVE
Candida Vaginitis: NEGATIVE
Chlamydia: NEGATIVE
Comment: NEGATIVE
Comment: NEGATIVE
Comment: NEGATIVE
Comment: NEGATIVE
Comment: NEGATIVE
Comment: NORMAL
Neisseria Gonorrhea: NEGATIVE
Trichomonas: NEGATIVE

## 2020-12-01 ENCOUNTER — Ambulatory Visit: Payer: Self-pay

## 2020-12-02 ENCOUNTER — Ambulatory Visit
Admission: RE | Admit: 2020-12-02 | Discharge: 2020-12-02 | Disposition: A | Discharge status: Home / Self Care | Payer: Medicaid Other | Source: Ambulatory Visit | Attending: Family Medicine | Admitting: Family Medicine

## 2020-12-02 ENCOUNTER — Other Ambulatory Visit: Payer: Self-pay

## 2020-12-02 VITALS — BP 122/83 | HR 73 | Temp 98.1°F | Resp 16

## 2020-12-02 DIAGNOSIS — J029 Acute pharyngitis, unspecified: Secondary | ICD-10-CM | POA: Insufficient documentation

## 2020-12-02 LAB — POCT RAPID STREP A (OFFICE): Rapid Strep A Screen: NEGATIVE

## 2020-12-02 MED ORDER — PREDNISONE 20 MG PO TABS: 40.0000 mg | ORAL_TABLET | Freq: Every day | ORAL | 0 refills | Status: DC

## 2020-12-02 NOTE — Discharge Instructions (Signed)
You may use over the counter ibuprofen or acetaminophen as needed.  For a sore throat, over the counter products such as Colgate Peroxyl Mouth Sore Rinse or Chloraseptic Sore Throat Spray may provide some temporary relief. Your rapid strep test was negative today. We have sent your throat swab for culture and will let you know of any positive results. 

## 2020-12-02 NOTE — ED Triage Notes (Signed)
Patient c/o runny nose and sore throat x 4 weeks.   Patient denies fever at home.   Patient endorses " I feel like a lot of drainage is coming into the back of my throat and making it sore".   Patient endorses sinus pressure upon onset of symptoms.   Patient has taken Mucinex with some relief of symptoms.

## 2020-12-02 NOTE — ED Provider Notes (Signed)
[facility]   [id number] 12/02/20 Arrival Time: 1447  ASSESSMENT & PLAN:  1. Sore throat    No signs of peritonsillar abscess. Discussed. Trial of: Meds ordered this encounter  Medications   predniSONE (DELTASONE) 20 MG tablet    Sig: Take 2 tablets (40 mg total) by mouth daily.    Dispense:  10 tablet    Refill:  0    Results for orders placed or performed during the hospital encounter of 12/02/20  POCT rapid strep A  Result Value Ref Range   Rapid Strep A Screen Negative Negative   Labs Reviewed  CULTURE, GROUP A STREP [facility])  POCT RAPID STREP A (OFFICE)  Culture pending.  OTC analgesics and throat care as needed   Reviewed expectations re: course of current medical issues. Questions answered. Outlined signs and symptoms indicating need for more acute intervention. Patient verbalized understanding. After Visit Summary given.   SUBJECTIVE:  [name] Brown is a 30 y.o. female who reports a sore throat. Onset gradual beginning  3-4 w ago; off/on . Symptoms have waxed and waned since beginning; without voice changes. No respiratory symptoms. Normal PO intake but reports discomfort with swallowing. No specific alleviating factors. Fever: absent. No neck pain or swelling. No associated nausea, vomiting, or abdominal pain. Known sick contacts: none. Recent travel: none.   OBJECTIVE:  Vitals:   12/02/20 1519  BP: 122/83  Pulse: 73  Resp: 16  Temp: 98.1 F (36.7 C)  TempSrc: Oral  SpO2: 98%     General appearance: alert; no distress HEENT: throat with mild erythema; uvula is midline Neck: supple with FROM; no lymphadenopathy Lungs: speaks full sentences without difficulty; unlabored Abd: soft; non-tender Skin: reveals no rash; warm and dry Psychological: alert and cooperative; normal mood and affect  Allergies  Allergen Reactions   Latex Other (See Comments)    " Burning and skin swells up"   Mineral Oil Rash    Baby oil    Past Medical  History:  Diagnosis Date   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/12/2020   05/12/20 pt aware and needs colpo per ASCCP guidelines, has immediate risk of CIN 3+ of 4.4 %, will get colpo, before getting IUD placed    Chronic abdominal pain    Colitis    Counseling for birth control, oral contraceptives 06/26/2014   Medical history non-contributory    Missed period 06/26/2014   Social History   Socioeconomic History   Marital status: Single    Spouse name: Not on file   Number of children: 1   Years of education: Not on file   Highest education level: Not on file  Occupational History   Not on file  Tobacco Use   Smoking status: Never   Smokeless tobacco: Never  Vaping Use   Vaping Use: Never used  Substance and Sexual Activity   Alcohol use: Yes    Comment: occ   Drug use: No   Sexual activity: Not Currently    Birth control/protection: None  Other Topics Concern   Not on file  Social History Narrative   Not on file   Social Determinants of Health   Financial Resource Strain: Not on file  Food Insecurity: Not on file  Transportation Needs: Not on file  Physical Activity: Not on file  Stress: Not on file  Social Connections: Not on file  Intimate Partner Violence: Not on file   Family History  Problem Relation Age of Onset  Cancer Mother        cervical   Alcohol abuse Mother    Ovarian cysts Mother    Other Father        fatty liver   Cancer Maternal Grandmother        skin   Dementia Maternal Grandmother    Dementia Paternal Grandmother    Diabetes Paternal Grandfather    Dementia Paternal Grandfather    Ovarian cysts Sister    Ovarian cysts Sister    Jaundice Daughter            Vanessa Kick, MD 12/02/20 [phone]

## 2020-12-05 LAB — CULTURE, GROUP A STREP (THRC)

## 2020-12-25 ENCOUNTER — Ambulatory Visit
Admission: RE | Admit: 2020-12-25 | Discharge: 2020-12-25 | Disposition: A | Discharge status: Home / Self Care | Payer: Medicaid Other | Source: Ambulatory Visit | Attending: Physician Assistant | Admitting: Physician Assistant

## 2020-12-25 ENCOUNTER — Other Ambulatory Visit: Payer: Self-pay

## 2020-12-25 VITALS — BP 117/81 | HR 97 | Temp 98.3°F | Resp 18

## 2020-12-25 DIAGNOSIS — J019 Acute sinusitis, unspecified: Secondary | ICD-10-CM

## 2020-12-25 MED ORDER — AMOXICILLIN-POT CLAVULANATE 875-125 MG PO TABS: 1.0000 | ORAL_TABLET | Freq: Two times a day (BID) | ORAL | 0 refills | Status: DC

## 2020-12-25 MED ORDER — PROMETHAZINE-DM 6.25-15 MG/5ML PO SYRP: 5.0000 mL | ORAL_SOLUTION | Freq: Four times a day (QID) | ORAL | 0 refills | Status: DC | PRN

## 2020-12-25 NOTE — ED Triage Notes (Signed)
Patient states she is experiencing a cough and it is burning in her chest.   She also thinks she has a sinus infection. She is having a numbing pain under her eyes and in her jaw line.  She states she has taken Sudafed and Zyrtec.  Denies Fever.

## 2020-12-25 NOTE — ED Provider Notes (Addendum)
[facility]    CSN: [id number] Arrival date & time: 12/25/20  0944      History   Chief Complaint No chief complaint on file.   HPI [name] Brown is a 30 y.o. female.   Pt complains of persistent cough since being diagnosed with flu on 12/20/20.  She reports she is now experiencing sinus pain and pressure.  She has been taking sudafed and zyrtec with minimal improvement.  Denies shortness of breath, wheezing, fever, chills.     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   Nail abnormality 05/06/2020   Depression with anxiety 05/06/2020   Bacteremia due to Escherichia coli 05/05/2019   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/2017   Status post primary low transverse cesarean section 04/14/2017   BV (bacterial vaginosis) 09/11/2016   Abdominal pain 04/22/2014    Past Surgical History:  Procedure Laterality Date   CESAREAN SECTION N/A 04/13/2017   Procedure: CESAREAN SECTION;  Surgeon: Aletha Halim, MD;  Location: [place];  Service: Obstetrics;  Laterality: N/A;   NO PAST SURGERIES      OB History     Gravida  1   Para  1   Term  1   Preterm  0   AB  0   Living  1      SAB  0   IAB  0   Ectopic  0   Multiple  0   Live Births  1            Home Medications    Prior to Admission medications   Medication Sig Start Date End Date Taking? Authorizing Provider  amoxicillin-clavulanate (AUGMENTIN) 875-125 MG tablet Take 1 tablet by mouth  every 12 (twelve) hours for 5 days. 12/25/20 12/30/20 Yes Ward, Lenise Arena, PA-C  promethazine-dextromethorphan (PROMETHAZINE-DM) 6.25-15 MG/5ML syrup Take 5 mLs by mouth 4 (four) times daily as needed for cough. 12/25/20  Yes Ward, Lenise Arena, PA-C  predniSONE (DELTASONE) 20 MG tablet Take 2 tablets (40 mg total) by mouth daily. 12/02/20   Vanessa Kick, MD    Family History Family History  Problem Relation Age of Onset   Cancer Mother        cervical   Alcohol abuse Mother    Ovarian cysts Mother    Other Father        fatty liver   Cancer Maternal Grandmother        skin   Dementia Maternal Grandmother    Dementia Paternal Grandmother    Diabetes Paternal Grandfather    Dementia Paternal Grandfather    Ovarian cysts Sister    Ovarian cysts Sister    Jaundice Daughter     Social History Social History   Tobacco Use   Smoking status: Never   Smokeless tobacco: Never  Vaping Use   Vaping Use: Never used  Substance Use Topics   Alcohol use: Yes    Comment: occ   Drug  use: No     Allergies   Latex and Mineral oil   Review of Systems Review of Systems  Constitutional:  Negative for chills and fever.  HENT:  Positive for congestion, sinus pressure and sinus pain. Negative for ear pain and sore throat.   Eyes:  Negative for pain and visual disturbance.  Respiratory:  Positive for cough. Negative for shortness of breath and wheezing.   Cardiovascular:  Negative for chest pain and palpitations.  Gastrointestinal:  Negative for abdominal pain and vomiting.  Genitourinary:  Negative for dysuria and hematuria.  Musculoskeletal:  Negative for arthralgias and back pain.  Skin:  Negative for color change and rash.  Neurological:  Negative for seizures and syncope.  All other systems reviewed and are negative.   Physical Exam Triage Vital Signs ED Triage Vitals  Enc Vitals Group     BP 12/25/20 1044 117/81     Pulse Rate 12/25/20 1044 97     Resp 12/25/20 1044 18      Temp 12/25/20 1044 98.3 F (36.8 C)     Temp Source 12/25/20 1044 Oral     SpO2 12/25/20 1044 97 %     Weight --      Height --      Head Circumference --      Peak Flow --      Pain Score 12/25/20 1042 10     Pain Loc --      Pain Edu? --      Excl. in [place]? --    No data found.  Updated Vital Signs BP 117/81 (BP Location: Right Arm)   Pulse 97   Temp 98.3 F (36.8 C) (Oral)   Resp 18   LMP 12/09/2020 (Approximate)   SpO2 97%   Visual Acuity Right Eye Distance:   Left Eye Distance:   Bilateral Distance:    Right Eye Near:   Left Eye Near:    Bilateral Near:     Physical Exam Vitals and nursing note reviewed.  Constitutional:      General: She is not in acute distress.    Appearance: She is well-developed.  HENT:     Head: Normocephalic and atraumatic.  Eyes:     Conjunctiva/sclera: Conjunctivae normal.  Cardiovascular:     Rate and Rhythm: Normal rate and regular rhythm.     Heart sounds: No murmur heard. Pulmonary:     Effort: Pulmonary effort is normal. No respiratory distress.     Breath sounds: Normal breath sounds.  Abdominal:     Palpations: Abdomen is soft.     Tenderness: There is no abdominal tenderness.  Musculoskeletal:        General: No swelling.     Cervical back: Neck supple.  Skin:    General: Skin is warm and dry.     Capillary Refill: Capillary refill takes less than 2 seconds.  Neurological:     Mental Status: She is alert.  Psychiatric:        Mood and Affect: Mood normal.     UC Treatments / Results  Labs (all labs ordered are listed, but only abnormal results are displayed) Labs Reviewed - No data to display  EKG   Radiology No results found.  Procedures Procedures (including critical care time)  Medications Ordered in UC Medications - No data to display  Initial Impression / Assessment and Plan / UC Course  I have reviewed the triage vital signs and the nursing notes.  Pertinent labs &  imaging results that were  available during my care of the patient were reviewed by me and considered in my medical decision making (see chart for details).     Sinusitis following flu diagnosis. Antibiotic prescribed.  Cough syrup prescribed.  Advised continued use of otc cold and cough medications.  Vitals stable, lungs clear. Return precautions discussed.  Final Clinical Impressions(s) / UC Diagnoses   Final diagnoses:  Acute non-recurrent sinusitis, unspecified location     Discharge Instructions      Take antibiotic as prescribed Can use cough syrup as needed Continue with Mucinex Drink plenty of fluids Return if symptoms become worse    ED Prescriptions     Medication Sig Dispense Auth. Provider   amoxicillin-clavulanate (AUGMENTIN) 875-125 MG tablet Take 1 tablet by mouth every 12 (twelve) hours for 5 days. 10 tablet Ward, Lenise Arena, PA-C   promethazine-dextromethorphan (PROMETHAZINE-DM) 6.25-15 MG/5ML syrup Take 5 mLs by mouth 4 (four) times daily as needed for cough. 118 mL Ward, Lenise Arena, PA-C      PDMP not reviewed this encounter.   Ward, Lenise Arena, PA-C 12/25/20 1112    Ward, Lenise Arena, PA-C 12/25/20 1112

## 2020-12-25 NOTE — Discharge Instructions (Addendum)
Take antibiotic as prescribed Can use cough syrup as needed Continue with Mucinex Drink plenty of fluids Return if symptoms become worse

## 2020-12-30 ENCOUNTER — Emergency Department (HOSPITAL_BASED_OUTPATIENT_CLINIC_OR_DEPARTMENT_OTHER)
Admission: EM | Admit: 2020-12-30 | Discharge: 2020-12-30 | Disposition: A | Discharge status: Home / Self Care | Payer: Medicaid Other | Attending: Student | Admitting: Student

## 2020-12-30 ENCOUNTER — Encounter: Payer: Self-pay | Admitting: Emergency Medicine

## 2020-12-30 ENCOUNTER — Emergency Department (HOSPITAL_BASED_OUTPATIENT_CLINIC_OR_DEPARTMENT_OTHER): Payer: Medicaid Other

## 2020-12-30 ENCOUNTER — Emergency Department
Admission: EM | Admit: 2020-12-30 | Discharge: 2020-12-30 | Disposition: A | Discharge status: Home / Self Care | Payer: Self-pay | Source: Home / Self Care | Attending: Family Medicine | Admitting: Family Medicine

## 2020-12-30 ENCOUNTER — Emergency Department (HOSPITAL_COMMUNITY)
Admission: EM | Admit: 2020-12-30 | Discharge: 2020-12-30 | Disposition: A | Discharge status: Home / Self Care | Payer: Medicaid Other | Attending: Emergency Medicine | Admitting: Emergency Medicine

## 2020-12-30 ENCOUNTER — Other Ambulatory Visit: Payer: Self-pay

## 2020-12-30 ENCOUNTER — Encounter (HOSPITAL_BASED_OUTPATIENT_CLINIC_OR_DEPARTMENT_OTHER): Payer: Self-pay | Admitting: *Deleted

## 2020-12-30 DIAGNOSIS — Z91040 Latex allergy status: Secondary | ICD-10-CM | POA: Insufficient documentation

## 2020-12-30 DIAGNOSIS — R220 Localized swelling, mass and lump, head: Secondary | ICD-10-CM | POA: Insufficient documentation

## 2020-12-30 DIAGNOSIS — R519 Headache, unspecified: Secondary | ICD-10-CM | POA: Insufficient documentation

## 2020-12-30 DIAGNOSIS — J0140 Acute pansinusitis, unspecified: Secondary | ICD-10-CM | POA: Insufficient documentation

## 2020-12-30 DIAGNOSIS — J320 Chronic maxillary sinusitis: Secondary | ICD-10-CM

## 2020-12-30 DIAGNOSIS — H53141 Visual discomfort, right eye: Secondary | ICD-10-CM | POA: Insufficient documentation

## 2020-12-30 DIAGNOSIS — H5711 Ocular pain, right eye: Secondary | ICD-10-CM | POA: Insufficient documentation

## 2020-12-30 DIAGNOSIS — J3489 Other specified disorders of nose and nasal sinuses: Secondary | ICD-10-CM | POA: Insufficient documentation

## 2020-12-30 DIAGNOSIS — Z5321 Procedure and treatment not carried out due to patient leaving prior to being seen by health care provider: Secondary | ICD-10-CM | POA: Insufficient documentation

## 2020-12-30 LAB — CBC WITH DIFFERENTIAL/PLATELET
Abs Immature Granulocytes: 0.18 10*3/uL — ABNORMAL HIGH (ref 0.00–0.07)
Abs Immature Granulocytes: 0.15 10*3/uL — ABNORMAL HIGH (ref 0.00–0.07)
Basophils Absolute: 0 10*3/uL (ref 0.0–0.1)
Basophils Absolute: 0 10*3/uL (ref 0.0–0.1)
Basophils Relative: 0 %
Basophils Relative: 0 %
Eosinophils Absolute: 0.1 10*3/uL (ref 0.0–0.5)
Eosinophils Absolute: 0.1 10*3/uL (ref 0.0–0.5)
Eosinophils Relative: 1 %
Eosinophils Relative: 1 %
HCT: 40.9 % (ref 36.0–46.0)
HCT: 41.2 % (ref 36.0–46.0)
Hemoglobin: 13.7 g/dL (ref 12.0–15.0)
Hemoglobin: 14 g/dL (ref 12.0–15.0)
Immature Granulocytes: 2 %
Immature Granulocytes: 1 %
Lymphocytes Relative: 26 %
Lymphocytes Relative: 21 %
Lymphs Abs: 2.4 10*3/uL (ref 0.7–4.0)
Lymphs Abs: 2.5 10*3/uL (ref 0.7–4.0)
MCH: 31.4 pg (ref 26.0–34.0)
MCH: 31 pg (ref 26.0–34.0)
MCHC: 33.5 g/dL (ref 30.0–36.0)
MCHC: 34 g/dL (ref 30.0–36.0)
MCV: 93.6 fL (ref 80.0–100.0)
MCV: 91.4 fL (ref 80.0–100.0)
Monocytes Absolute: 0.9 10*3/uL (ref 0.1–1.0)
Monocytes Absolute: 0.9 10*3/uL (ref 0.1–1.0)
Monocytes Relative: 9 %
Monocytes Relative: 7 %
Neutro Abs: 5.9 10*3/uL (ref 1.7–7.7)
Neutro Abs: 8 10*3/uL — ABNORMAL HIGH (ref 1.7–7.7)
Neutrophils Relative %: 62 %
Neutrophils Relative %: 70 %
Platelets: 395 10*3/uL (ref 150–400)
Platelets: 411 10*3/uL — ABNORMAL HIGH (ref 150–400)
RBC: 4.37 MIL/uL (ref 3.87–5.11)
RBC: 4.51 MIL/uL (ref 3.87–5.11)
RDW: 11.9 % (ref 11.5–15.5)
RDW: 12.1 % (ref 11.5–15.5)
WBC: 9.5 10*3/uL (ref 4.0–10.5)
WBC: 11.6 10*3/uL — ABNORMAL HIGH (ref 4.0–10.5)
nRBC: 0 % (ref 0.0–0.2)
nRBC: 0 % (ref 0.0–0.2)

## 2020-12-30 LAB — BASIC METABOLIC PANEL
Anion gap: 9 (ref 5–15)
BUN: 7 mg/dL (ref 6–20)
CO2: 24 mmol/L (ref 22–32)
Calcium: 9.2 mg/dL (ref 8.9–10.3)
Chloride: 105 mmol/L (ref 98–111)
Creatinine, Ser: 0.65 mg/dL (ref 0.44–1.00)
GFR, Estimated: >60 mL/min (ref >60)
Glucose, Bld: 91 mg/dL (ref 70–99)
Potassium: 4 mmol/L (ref 3.5–5.1)
Sodium: 138 mmol/L (ref 135–145)

## 2020-12-30 LAB — I-STAT BETA HCG BLOOD, ED (MC, WL, AP ONLY): I-stat hCG, quantitative: <5 m[IU]/mL (ref <5)

## 2020-12-30 LAB — URINALYSIS, ROUTINE W REFLEX MICROSCOPIC
Bilirubin Urine: NEGATIVE
Glucose, UA: NEGATIVE mg/dL
Hgb urine dipstick: NEGATIVE
Ketones, ur: NEGATIVE mg/dL
Leukocytes,Ua: NEGATIVE
Nitrite: NEGATIVE
Protein, ur: NEGATIVE mg/dL
Specific Gravity, Urine: >1.03 — ABNORMAL HIGH (ref 1.005–1.030)
pH: 6 (ref 5.0–8.0)

## 2020-12-30 LAB — COMPREHENSIVE METABOLIC PANEL
ALT: 35 U/L (ref 0–44)
AST: 26 U/L (ref 15–41)
Albumin: 3.7 g/dL (ref 3.5–5.0)
Alkaline Phosphatase: 66 U/L (ref 38–126)
Anion gap: 8 (ref 5–15)
BUN: 6 mg/dL (ref 6–20)
CO2: 24 mmol/L (ref 22–32)
Calcium: 9.1 mg/dL (ref 8.9–10.3)
Chloride: 106 mmol/L (ref 98–111)
Creatinine, Ser: 0.68 mg/dL (ref 0.44–1.00)
GFR, Estimated: >60 mL/min (ref >60)
Glucose, Bld: 118 mg/dL — ABNORMAL HIGH (ref 70–99)
Potassium: 4.1 mmol/L (ref 3.5–5.1)
Sodium: 138 mmol/L (ref 135–145)
Total Bilirubin: 0.4 mg/dL (ref 0.3–1.2)
Total Protein: 7.4 g/dL (ref 6.5–8.1)

## 2020-12-30 MED ORDER — DOXYCYCLINE HYCLATE 100 MG PO TABS
100.0000 mg | ORAL_TABLET | Freq: Once | ORAL | Status: AC
  Administered 2020-12-30: 100 mg via ORAL
  Filled 2020-12-30: qty 1

## 2020-12-30 MED ORDER — IOHEXOL 300 MG/ML  SOLN
75.0000 mL | Freq: Once | INTRAMUSCULAR | Status: AC | PRN
  Administered 2020-12-30: 75 mL via INTRAVENOUS

## 2020-12-30 MED ORDER — OXYMETAZOLINE HCL 0.05 % NA SOLN
1.0000 | Freq: Two times a day (BID) | NASAL | 0 refills | Status: DC
Start: 2020-12-30 — End: 2022-02-10

## 2020-12-30 MED ORDER — HYDROCODONE-ACETAMINOPHEN 5-325 MG PO TABS: 1.0000 | ORAL_TABLET | Freq: Once | ORAL | Status: DC

## 2020-12-30 MED ORDER — DOXYCYCLINE HYCLATE 100 MG PO CAPS: 100.0000 mg | ORAL_CAPSULE | Freq: Two times a day (BID) | ORAL | 0 refills | Status: DC

## 2020-12-30 NOTE — ED Triage Notes (Signed)
Pt. Stated, [name] Brown been treated for a sinus infection for 5 days with antibiotic and today around 900 my right side of my eye and face started hurting and unable to open my rt. Eye.

## 2020-12-30 NOTE — ED Notes (Signed)
Pt gave this tech her name stickers and stated she was leaving

## 2020-12-30 NOTE — Discharge Instructions (Addendum)
Your CT did not show any signs of abscess in your sinuses, however it did show a lot of inflammation in all 3 of your right sinus cavities.  Since you finished Augmentin, I am going to send in a prescription for doxycycline - the first dose you will take tomorrow since you had the first dose to the ED today.  I am also sending in an intranasal medication called Afrin which may help with symptoms of congestion.  Please do not take this for more than 3 days at a time as this can actually worsen your symptoms of used to.  I have attached some handouts here about doing saline rinses for your sinuses at home.  I also recommend that you pick up an intranasal steroid such as Flonase which may help with some of your inflammation.  Please return if you have worsening fevers, worsening eye pain or vision changes or if your symptoms do not resolve within the next week.

## 2020-12-30 NOTE — ED Provider Notes (Signed)
Emergency Medicine Provider Triage Evaluation Note  [name] Brown , a 30 y.o. female  was evaluated in triage.  Pt complains of sinus congestion and pain for 2 weeks.  Started Augmentin 5 days ago, has been taking it as prescribed.  Symptoms were improving until today when she woke up with severe right-sided headache, right eye pain, and difficulty opening her right eye.  She has associated photophobia on the right, not consensual.  No fevers.  No trauma.  Review of Systems  Positive: R eye pain, R side HA Negative: fever  Physical Exam  LMP 12/09/2020 (Approximate)  Gen:   Awake, no distress   Resp:  Normal effort  MSK:   Moves extremities without difficulty  Other:  Mild swelling around the right cheek and eye.  Patient with obvious difficulty opening her right eye.  EOMI.  No purulent drainage.  Medical Decision Making  Medically screening exam initiated at 9:58 AM.  Appropriate orders placed.  Azalia L Feltes was informed that the remainder of the evaluation will be completed by another provider, this initial triage assessment does not replace that evaluation, and the importance of remaining in the ED until their evaluation is complete.  Labs and ct to r/o deep space or periorbital infection   Alveria Apley, PA-C 12/30/20 [id number]    Milagros Loll, MD 12/30/20 1020

## 2020-12-30 NOTE — ED Provider Notes (Signed)
[facility]    CSN: [id number] Arrival date & time: 12/30/20  1239      History   Chief Complaint Chief Complaint  Patient presents with   Facial Pain    HPI [name] Brown is a 30 y.o. female.   Patient reports that she had the flu about 3 weeks ago.  Her symptoms improved except for persistent sinus congestion that gradually became worse, especially on the right including right upper teeth pain.  She contacted her PCP who started her on Augmentin five days ago for sinusitis.  Symptoms were improving until today when she awakened with severe right-sided headache, right eye pain, and difficulty opening her right eye.  She has associated photophobia on the right, not consensual.  She notes pressure, but not pain in her right ear and denies fevers, chills, and sweats. She presented to the [facility] ED this morning where evaluation was initiated but patient decided to leave because of long wait time.  Her CBC done this morning showed WBC 9.5.   The history is provided by the patient.   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   Nail abnormality 05/06/2020   Depression with anxiety 05/06/2020   Bacteremia due to Escherichia coli 05/05/2019   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/2017   Status post primary low transverse cesarean section 04/14/2017   BV (bacterial vaginosis) 09/11/2016   Abdominal pain 04/22/2014    Past Surgical History:  Procedure Laterality Date    CESAREAN SECTION N/A 04/13/2017   Procedure: CESAREAN SECTION;  Surgeon: Holland Bing, MD;  Location: [facility];  Service: Obstetrics;  Laterality: N/A;   NO PAST SURGERIES      OB History     Gravida  1   Para  1   Term  1   Preterm  0   AB  0   Living  1      SAB  0   IAB  0   Ectopic  0   Multiple  0   Live Births  1            Home Medications    Prior to Admission medications   Medication Sig Start Date End Date Taking? Authorizing Provider  amoxicillin-clavulanate (AUGMENTIN) 875-125 MG tablet Take 1 tablet by mouth every 12 (twelve) hours for 5 days. 12/25/20 12/30/20  Ward, Tylene Fantasia, PA-C  predniSONE (DELTASONE) 20 MG tablet Take 2 tablets (40 mg total) by mouth daily. 12/02/20   Mardella Layman, MD  promethazine-dextromethorphan (PROMETHAZINE-DM) 6.25-15 MG/5ML syrup Take 5 mLs by mouth 4 (four) times daily as needed for cough. 12/25/20   Ward, Tylene Fantasia, PA-C    Family History Family History  Problem Relation Age of Onset   Cancer Mother        cervical   Alcohol abuse Mother    Ovarian cysts Mother    Other Father        fatty liver  Cancer Maternal Grandmother        skin   Dementia Maternal Grandmother    Dementia Paternal Grandmother    Diabetes Paternal Grandfather    Dementia Paternal Grandfather    Ovarian cysts Sister    Ovarian cysts Sister    Jaundice Daughter     Social History Social History   Tobacco Use   Smoking status: Never   Smokeless tobacco: Never  Vaping Use   Vaping Use: Never used  Substance Use Topics   Alcohol use: Yes    Comment: occ   Drug use: No     Allergies   Latex and Mineral oil   Review of Systems Review of Systems  Constitutional:  Positive for activity change. Negative for appetite change, chills, diaphoresis, fatigue and fever.  HENT:  Positive for congestion, ear pain, facial swelling, sinus pressure and sinus pain. Negative for hearing loss, sore throat and trouble  swallowing.   Eyes:  Positive for photophobia, pain and redness. Negative for discharge.  Respiratory: Negative.    Cardiovascular: Negative.   Gastrointestinal: Negative.   Genitourinary: Negative.   Musculoskeletal: Negative.   Skin:  Negative for rash.  Neurological:  Positive for headaches. Negative for facial asymmetry, speech difficulty, weakness and light-headedness.  Hematological:  Negative for adenopathy.    Physical Exam Triage Vital Signs ED Triage Vitals  Enc Vitals Group     BP 12/30/20 1308 120/86     Pulse Rate 12/30/20 1308 80     Resp 12/30/20 1308 16     Temp 12/30/20 1308 99.1 F (37.3 C)     Temp Source 12/30/20 1308 Oral     SpO2 12/30/20 1308 99 %     Weight 12/30/20 1309 163 lb (73.9 kg)     Height 12/30/20 1309 5\' 4"  (1.626 m)     Head Circumference --      Peak Flow --      Pain Score 12/30/20 1308 8     Pain Loc --      Pain Edu? --      Excl. in [place]? --    No data found.  Updated Vital Signs BP 120/86 (BP Location: Left Arm)   Pulse 80   Temp 99.1 F (37.3 C) (Oral)   Resp 16   Ht 5\' 4"  (1.626 m)   Wt 73.9 kg   LMP 12/09/2020 (Approximate)   SpO2 99%   BMI 27.98 kg/m   Visual Acuity Right Eye Distance:   Left Eye Distance:   Bilateral Distance:    Right Eye Near:   Left Eye Near:    Bilateral Near:     Physical Exam Vitals and nursing note reviewed.  Constitutional:      General: She is not in acute distress.    Appearance: She is not ill-appearing.  HENT:     Head: Normocephalic.      Comments: There is tenderness to palpation over right maxillary sinus without swelling. No frontal sinus tenderness.    Right Ear: Tympanic membrane and ear canal normal.     Left Ear: Tympanic membrane and ear canal normal.     Nose: Nose normal.     Mouth/Throat:     Mouth: Mucous membranes are moist.     Pharynx: Oropharynx is clear.  Eyes:     Extraocular Movements: Extraocular movements intact.     Pupils: Pupils are equal, round,  and reactive to light.     Comments: Patient has mild swelling  of right eyelid with mild tenderness to palpation.   No evidence right proptosis. Right conjunctivae mildly injected.  Mild right photophobia present.  Cardiovascular:     Rate and Rhythm: Normal rate and regular rhythm.     Heart sounds: Normal heart sounds.  Pulmonary:     Breath sounds: Normal breath sounds.  Abdominal:     Palpations: Abdomen is soft.     Tenderness: There is no abdominal tenderness.  Musculoskeletal:     Cervical back: Neck supple.  Lymphadenopathy:     Cervical: No cervical adenopathy.  Skin:    General: Skin is warm and dry.     Findings: No rash.  Neurological:     General: No focal deficit present.     Mental Status: She is alert and oriented to person, place, and time.     UC Treatments / Results  Labs (all labs ordered are listed, but only abnormal results are displayed) Labs Reviewed - No data to display  EKG   Radiology No results found.  Procedures Procedures (including critical care time)  Medications Ordered in UC Medications - No data to display  Initial Impression / Assessment and Plan / UC Course  I have reviewed the triage vital signs and the nursing notes.  Pertinent labs & imaging results that were available during my care of the patient were reviewed by me and considered in my medical decision making (see chart for details).    Patient failed a course of Augmentin for maxillary sinusitis, now developing worsening right facial pain and right eye pain. Concern for worsening right maxillary sinusitis and developing right orbital cellulitis.  Feel that patient needs a CT maxillofacial w/contrast, but she has Medicaid which mandates a radiologist in house.  Advised patient to proceed to [facility] ED for further evaluation.  Final Clinical Impressions(s) / UC Diagnoses   Final diagnoses:  Right maxillary sinusitis   Discharge Instructions   None    ED  Prescriptions   None       Kandra Nicolas, MD 12/31/20 1335

## 2020-12-30 NOTE — ED Triage Notes (Signed)
Right sided facial pain. She was seen at UC this am after completing sinusitis tx. She was told she needs a CT scan.

## 2020-12-30 NOTE — ED Triage Notes (Signed)
Has been treated for a sinus infection today right side of face started hurting and unable to open rt eye, swollen.

## 2020-12-30 NOTE — ED Provider Notes (Signed)
[facility] EMERGENCY DEPARTMENT Provider Note   CSN: [id number] Arrival date & time: 12/30/20  1501     History Chief Complaint  Patient presents with   Facial Pain    [place] is a 30 y.o. female who presents for right-sided facial pain.  She has recently completed a course of Augmentin for suspected sinusitis from her PCP.  However her facial pain has not improved so patient went to urgent care earlier this morning.  She was then referred here in order to get a CT scan.  Currently patient has severe right-sided headache, right eye pain that is worse with movement of the eyes, right ear pain and right jaw pain as well.  Pain is also worse with palpation around her eye.  She has been taking Tylenol or ibuprofen without any symptomatic relief.  She has been using Mucinex which helps briefly but symptoms usually return shortly after.  Patient denies fever, chills, abdominal pain, nausea, vomiting, diarrhea.  She has no prior history of sinusitis.  HPI     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   Nail abnormality 05/06/2020   Depression with anxiety 05/06/2020   Bacteremia due to Escherichia coli 05/05/2019   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/2017   Status post primary low transverse cesarean section 04/14/2017   BV (bacterial vaginosis) 09/11/2016   Abdominal pain 04/22/2014    Past Surgical History:  Procedure Laterality Date   CESAREAN SECTION N/A 04/13/2017    Procedure: CESAREAN SECTION;  Surgeon: Aletha Halim, MD;  Location: [facility];  Service: Obstetrics;  Laterality: N/A;   NO PAST SURGERIES       OB History     Gravida  1   Para  1   Term  1   Preterm  0   AB  0   Living  1      SAB  0   IAB  0   Ectopic  0   Multiple  0   Live Births  1           Family History  Problem Relation Age of Onset   Cancer Mother        cervical   Alcohol abuse Mother    Ovarian cysts Mother    Other Father        fatty liver   Cancer Maternal Grandmother        skin   Dementia Maternal Grandmother    Dementia Paternal Grandmother    Diabetes Paternal Grandfather    Dementia Paternal Grandfather    Ovarian cysts Sister    Ovarian cysts Sister    Jaundice Daughter     Social History   Tobacco Use   Smoking status: Never   Smokeless tobacco: Never  Vaping Use   Vaping Use: Never used  Substance Use Topics   Alcohol use: Yes    Comment: occ   Drug use: No  Home Medications Prior to Admission medications   Medication Sig Start Date End Date Taking? Authorizing Provider  amoxicillin-clavulanate (AUGMENTIN) 875-125 MG tablet Take 1 tablet by mouth every 12 (twelve) hours for 5 days. 12/25/20 12/30/20  Ward, Tylene Fantasia, PA-C  predniSONE (DELTASONE) 20 MG tablet Take 2 tablets (40 mg total) by mouth daily. 12/02/20   Mardella Layman, MD  promethazine-dextromethorphan (PROMETHAZINE-DM) 6.25-15 MG/5ML syrup Take 5 mLs by mouth 4 (four) times daily as needed for cough. 12/25/20   Ward, Tylene Fantasia, PA-C    Allergies    Latex and Mineral oil  Review of Systems   Review of Systems  Constitutional:  Negative for fever.  HENT:  Positive for ear pain, sinus pressure and sinus pain.   Eyes:  Positive for photophobia and pain.  Respiratory:  Negative for shortness of breath.   Cardiovascular: Negative.   Gastrointestinal:  Negative for abdominal pain and vomiting.  Endocrine: Negative.   Genitourinary: Negative.    Musculoskeletal: Negative.   Skin:  Negative for rash.  Neurological:  Positive for headaches.  All other systems reviewed and are negative.  Physical Exam Updated Vital Signs BP 112/80 (BP Location: Left Arm)   Pulse 79   Temp 98.6 F (37 C) (Oral)   Resp 18   Ht 5\' 4"  (1.626 m)   Wt 73.9 kg   LMP 12/09/2020 (Approximate)   SpO2 100%   BMI 27.97 kg/m   Physical Exam Vitals and nursing note reviewed.  Constitutional:      General: She is not in acute distress.    Appearance: She is not ill-appearing.  HENT:     Head: Atraumatic.     Comments: Patient has tenderness to palpation on the right maxillary sinus.    Right Ear: Tympanic membrane normal.     Left Ear: Tympanic membrane normal.     Ears:     Comments: There is some redness of the right EAC without any signs of drainage or purulence.  Tympanic membrane intact bilaterally with visible bony landmarks.  No erythema or bulging.    Nose:     Right Sinus: Maxillary sinus tenderness present. No frontal sinus tenderness.     Left Sinus: No maxillary sinus tenderness or frontal sinus tenderness.     Comments: Turbinates nonswollen, without polyps or drainage    Mouth/Throat:     Pharynx: Oropharynx is clear.  Eyes:     Conjunctiva/sclera: Conjunctivae normal.     Comments: Patient has right eye pain during EOM exam.  Right eye does not appear proptosed. Eyelid slightly swollen.  Conjunctiva slightly injected.   Cardiovascular:     Rate and Rhythm: Normal rate and regular rhythm.     Pulses: Normal pulses.     Heart sounds: No murmur heard. Pulmonary:     Effort: Pulmonary effort is normal. No respiratory distress.     Breath sounds: Normal breath sounds.  Abdominal:     General: Abdomen is flat. There is no distension.     Palpations: Abdomen is soft.     Tenderness: There is no abdominal tenderness.  Musculoskeletal:        General: Normal range of motion.     Cervical back: Normal range of motion.  Skin:     General: Skin is warm and dry.     Capillary Refill: Capillary refill takes less than 2 seconds.  Neurological:     General: No focal deficit present.     Mental Status: She is alert.  Comments: 5/5 grip strength symmetric bilaterally.  Sensation intact.  No unilateral facial droop, no palsies, cranial nerves III through XII intact.  Psychiatric:        Mood and Affect: Mood normal.    ED Results / Procedures / Treatments   Labs (all labs ordered are listed, but only abnormal results are displayed) Labs Reviewed  CBC WITH DIFFERENTIAL/PLATELET - Abnormal; Notable for the following components:      Result Value   WBC 11.6 (*)    Platelets 411 (*)    Neutro Abs 8.0 (*)    Abs Immature Granulocytes 0.15 (*)    All other components within normal limits  URINALYSIS, ROUTINE W REFLEX MICROSCOPIC - Abnormal; Notable for the following components:   Color, Urine AMBER (*)    Specific Gravity, Urine >1.030 (*)    All other components within normal limits  BASIC METABOLIC PANEL    EKG None  Radiology CT Maxillofacial W Contrast  Result Date: 12/30/2020 CLINICAL DATA:  Right facial pain, concern for nasal abscess EXAM: CT MAXILLOFACIAL WITH CONTRAST TECHNIQUE: Multidetector CT imaging of the maxillofacial structures was performed with intravenous contrast. Multiplanar CT image reconstructions were also generated. CONTRAST:  57mL OMNIPAQUE IOHEXOL 300 MG/ML  SOLN COMPARISON:  None. FINDINGS: Osseous: No fracture or mandibular dislocation. No destructive process. Orbits: Negative. No traumatic or inflammatory finding. Sinuses: Partial opacification of the right maxillary sinus, right sphenoid sinus, and anterior right ethmoid air cells. The mastoids are well aerated. Soft tissues: Negative. Limited intracranial: No significant or unexpected finding. IMPRESSION: 1. No evidence of abscess. 2. Mucosal thickening in the right maxillary sinus, right sphenoid sinus, and right anterior ethmoid air  cells. Electronically Signed   By: Merilyn Baba M.D.   On: 12/30/2020 17:35    Procedures Procedures   Medications Ordered in ED Medications  iohexol (OMNIPAQUE) 300 MG/ML solution 75 mL (75 mLs Intravenous Contrast Given 12/30/20 1701)    ED Course  I have reviewed the triage vital signs and the nursing notes.  Pertinent labs & imaging results that were available during my care of the patient were reviewed by me and considered in my medical decision making (see chart for details).    MDM Rules/Calculators/A&P                         This is a 30 year old female who presents for evaluation of right-sided facial pain from sinusitis after failing a 7-day course of Augmentin.  On exam, patient is nontoxic-appearing however of her appears ill and uncomfortable.  She is afebrile with stable vitals.  Nose is clear of polyps, is nonboggy.  Pharynx is clear, uvula midline.  Ears normal.  Right eye is mildly injected.  She has right-sided maxillary tenderness on exam, but what is concerning is pain of her right eye during EOM usage.  She is also having photophobia and some mildly blurred vision of that eye.  Neuro exam otherwise normal.  CBC was normal aside from mildly elevated white blood cell count at 11.6.  BMP and UA were unremarkable.  I obtain CT of maxillofacial and orbits which identified mucosal swelling of the right maxillary, sphenoid, ethmoid sinuses without abscess or evidence of orbital cellulitis.  Patient symptoms most likely from worsening sinusitis.  I will start her on a course of doxycycline since she failed Augmentin therapy and have her follow-up with with her PCP or an ENT.  I have also given her some Afrin to  help with her nasal congestion, and she was instructed on its use and not to use for greater than 3 days.  The emergent differential diagnosis for acute eye pain includes, but is not limited to ocular ischemia, optic neuritis, temporal arteritis, Sinusitis, neuralgia,  Migraine, Acute angle closure glaucoma, eye trauma, uveitis, iritis, corneal abrasion/ulceration, and photokeratitis.  Patient symptoms and physical exam were not consistent with any of these diagnoses.  Final Clinical Impression(s) / ED Diagnoses Final diagnoses:  Acute non-recurrent pansinusitis    Rx / DC Orders ED Discharge Orders          Ordered    doxycycline (VIBRAMYCIN) 100 MG capsule  2 times daily        12/30/20 1937    oxymetazoline (AFRIN) 0.05 % nasal spray  2 times daily        12/30/20 1937             Rodena Piety 12/31/20 0934    Teressa Lower, MD 12/31/20 [phone]

## 2021-04-05 ENCOUNTER — Encounter: Payer: Self-pay | Admitting: Emergency Medicine

## 2021-04-05 ENCOUNTER — Ambulatory Visit
Admission: EM | Admit: 2021-04-05 | Discharge: 2021-04-05 | Disposition: A | Discharge status: Home / Self Care | Payer: Self-pay | Attending: Urgent Care | Admitting: Urgent Care

## 2021-04-05 ENCOUNTER — Other Ambulatory Visit: Payer: Self-pay

## 2021-04-05 DIAGNOSIS — J029 Acute pharyngitis, unspecified: Secondary | ICD-10-CM

## 2021-04-05 DIAGNOSIS — R070 Pain in throat: Secondary | ICD-10-CM

## 2021-04-05 DIAGNOSIS — R0982 Postnasal drip: Secondary | ICD-10-CM

## 2021-04-05 LAB — POCT RAPID STREP A (OFFICE): Rapid Strep A Screen: NEGATIVE

## 2021-04-05 MED ORDER — AMOXICILLIN 500 MG PO CAPS: 500.0000 mg | ORAL_CAPSULE | Freq: Two times a day (BID) | ORAL | 0 refills | Status: DC

## 2021-04-05 NOTE — ED Triage Notes (Signed)
Exposed to daughter who has strep.  Sore throat x 1 week ?

## 2021-04-05 NOTE — ED Provider Notes (Signed)
?[facility] ? ? ?MRN: [id number] DOB: 13-Jan-1991 ? ?Subjective:  ? ?[name] Brown is a 31 y.o. female presenting for 1 week history of persistent throat pain, painful swallowing.  Her daughter was seen 04/02/2021, tested positive for group A strep. ? ?No current facility-administered medications for this encounter. ? ?Current Outpatient Medications:  ?  doxycycline (VIBRAMYCIN) 100 MG capsule, Take 1 capsule (100 mg total) by mouth 2 (two) times daily., Disp: 19 capsule, Rfl: 0 ?  oxymetazoline (AFRIN) 0.05 % nasal spray, Place 1 spray into both nostrils 2 (two) times daily., Disp: 30 mL, Rfl: 0 ?  predniSONE (DELTASONE) 20 MG tablet, Take 2 tablets (40 mg total) by mouth daily., Disp: 10 tablet, Rfl: 0 ?  promethazine-dextromethorphan (PROMETHAZINE-DM) 6.25-15 MG/5ML syrup, Take 5 mLs by mouth 4 (four) times daily as needed for cough., Disp: 118 mL, Rfl: 0  ? ?Allergies  ?Allergen Reactions  ? Latex Other (See Comments)  ?  " Burning and skin swells up"  ? Mineral Oil Rash  ?  Baby oil  ? ? ?Past Medical History:  ?Diagnosis Date  ? 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 05/12/20 pt aware and needs colpo per ASCCP guidelines, has immediate risk of CIN 3+ of 4.4 %, will get colpo, before getting IUD placed   ? Chronic abdominal pain   ? Colitis   ? Counseling for birth control, oral contraceptives 06/26/2014  ? Medical history non-contributory   ? Missed period 06/26/2014  ?  ? ?Past Surgical History:  ?Procedure Laterality Date  ? CESAREAN SECTION N/A 04/13/2017  ? Procedure: CESAREAN SECTION;  Surgeon: [place], MD;  Location: [facility];  Service: Obstetrics;  Laterality: N/A;  ? NO PAST SURGERIES    ? ? ?Family History  ?Problem Relation Age of Onset  ? Cancer Mother   ?     cervical  ? Alcohol abuse Mother   ? Ovarian cysts Mother   ? Other Father   ?     fatty liver  ? Cancer Maternal Grandmother   ?     skin  ? Dementia Maternal Grandmother   ? Dementia Paternal  Grandmother   ? Diabetes Paternal Grandfather   ? Dementia Paternal Grandfather   ? Ovarian cysts Sister   ? Ovarian cysts Sister   ? Jaundice Daughter   ? ? ?Social History  ? ?Tobacco Use  ? Smoking status: Never  ? Smokeless tobacco: Never  ?Vaping Use  ? Vaping Use: Never used  ?Substance Use Topics  ? Alcohol use: Yes  ?  Comment: occ  ? Drug use: No  ? ? ?ROS ? ? ?Objective:  ? ?Vitals: ?BP 121/81 (BP Location: Right Arm)   Pulse 78   Temp 98.1 ?F (36.7 ?C) (Oral)   Resp 18   LMP 03/11/2021 (Approximate)   SpO2 98%  ? ?Physical Exam ?Constitutional:   ?   General: She is not in acute distress. ?   Appearance: Normal appearance. She is well-developed. She is not ill-appearing, toxic-appearing or diaphoretic.  ?HENT:  ?   Head: Normocephalic and atraumatic.  ?   Nose: Nose normal.  ?   Mouth/Throat:  ?   Mouth: Mucous membranes are moist.  ?   Pharynx: Oropharyngeal exudate (right sided) and posterior oropharyngeal erythema (right sided) present.  ?Eyes:  ?   General: No scleral icterus.    ?   Right eye: No discharge.     ?  Left eye: No discharge.  ?   Extraocular Movements: Extraocular movements intact.  ?Cardiovascular:  ?   Rate and Rhythm: Normal rate.  ?Pulmonary:  ?   Effort: Pulmonary effort is normal.  ?Skin: ?   General: Skin is warm and dry.  ?Neurological:  ?   General: No focal deficit present.  ?   Mental Status: She is alert and oriented to person, place, and time.  ?Psychiatric:     ?   Mood and Affect: Mood normal.     ?   Behavior: Behavior normal.  ? ? ?Results for orders placed or performed during the hospital encounter of 04/05/21 (from the past 24 hour(s))  ?POCT rapid strep A     Status: None  ? Collection Time: 04/05/21  6:12 PM  ?Result Value Ref Range  ? Rapid Strep A Screen Negative Negative  ? ? ?Assessment and Plan :  ? ?PDMP not reviewed this encounter. ? ?1. Acute pharyngitis, unspecified etiology   ?2. Throat pain   ?3. Post-nasal drainage   ? ?We will treat empirically  given her exposure.  Start amoxicillin.  Use supportive care otherwise. Counseled patient on potential for adverse effects with medications prescribed/recommended today, ER and return-to-clinic precautions discussed, patient verbalized understanding. ? ?  ?Wallis Bamberg, PA-C ?04/05/21 1838 ? ?

## 2021-04-20 ENCOUNTER — Ambulatory Visit
Admission: EM | Admit: 2021-04-20 | Discharge: 2021-04-20 | Disposition: A | Discharge status: Home / Self Care | Payer: Medicaid Other | Attending: Family Medicine | Admitting: Family Medicine

## 2021-04-20 ENCOUNTER — Encounter: Payer: Self-pay | Admitting: Emergency Medicine

## 2021-04-20 ENCOUNTER — Ambulatory Visit: Payer: Self-pay

## 2021-04-20 DIAGNOSIS — J029 Acute pharyngitis, unspecified: Secondary | ICD-10-CM

## 2021-04-20 LAB — POCT RAPID STREP A (OFFICE): Rapid Strep A Screen: NEGATIVE

## 2021-04-20 MED ORDER — LIDOCAINE VISCOUS HCL 2 % MT SOLN: 10.0000 mL | OROMUCOSAL | 0 refills | Status: DC | PRN

## 2021-04-20 NOTE — ED Triage Notes (Signed)
Sore throat since yesterday.  Recently has strep 2 weeks ago. ?

## 2021-04-20 NOTE — ED Provider Notes (Signed)
?[facility] ? ? ? ?CSN: [id number] ?Arrival date & time: 04/20/21  1630 ? ? ?  ? ?History   ?Chief Complaint ?Chief Complaint  ?Patient presents with  ? Sore Throat  ? ? ?HPI ?[name] Brown is a 31 y.o. female.  ? ?Patient presents today with 1 day history of sore, swollen feeling throat.  Denies fever, chills, congestion, cough, chest pain, shortness of breath, abdominal pain, nausea vomiting or diarrhea.  So far not trying anything over-the-counter for the symptoms.  She states her whole family including her had strep throat about 2 weeks ago and she completed her full course of antibiotics with resolution of those symptoms, wanted to make sure she did not have a recurrence of her strep infection. ? ? ?Past Medical History:  ?Diagnosis Date  ? 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 05/12/20 pt aware and needs colpo per ASCCP guidelines, has immediate risk of CIN 3+ of 4.4 %, will get colpo, before getting IUD placed   ? Chronic abdominal pain   ? Colitis   ? Counseling for birth control, oral contraceptives 06/26/2014  ? Medical history non-contributory   ? Missed period 06/26/2014  ? ? ?Patient Active Problem List  ? Diagnosis Date Noted  ? 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 Nail abnormality 05/06/2020  ? Depression with anxiety 05/06/2020  ? Bacteremia due to Escherichia coli 05/05/2019  ? 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  ? 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/2017  ? Status post primary low transverse cesarean section 04/14/2017  ? BV (bacterial vaginosis) 09/11/2016  ? Abdominal pain 04/22/2014  ? ? ?Past Surgical History:  ?Procedure Laterality Date  ? CESAREAN SECTION N/A 04/13/2017  ? Procedure: CESAREAN SECTION;  Surgeon: [place] BingPickens, Charlie, MD;  [facility];  Service: Obstetrics;  Laterality: N/A;  ? NO PAST SURGERIES    ? ? ?OB History   ? ? Gravida  ?1  ? Para  ?1  ? Term  ?1  ? Preterm  ?0  ? AB  ?0  ? Living  ?1  ?  ? ? SAB  ?0  ? IAB  ?0  ? Ectopic   ?0  ? Multiple  ?0  ? Live Births  ?1  ?   ?  ?  ? ? ? ?Home Medications   ? ?Prior to Admission medications   ?Medication Sig Start Date End Date Taking? Authorizing Provider  ?lidocaine (XYLOCAINE) 2 % solution Use as directed 10 mLs in the mouth or throat every 3 (three) hours as needed for mouth pain. 04/20/21  Yes Particia NearingLane, Shafin Pollio Elizabeth, PA-C  ?amoxicillin (AMOXIL) 500 MG capsule Take 1 capsule (500 mg total) by mouth 2 (two) times daily. 04/05/21   Wallis BambergMani, Mario, PA-C  ?doxycycline (VIBRAMYCIN) 100 MG capsule Take 1 capsule (100 mg total) by mouth 2 (two) times daily. 12/30/20   Janell Quietonklin, Erica R, PA-C  ?oxymetazoline (AFRIN) 0.05 % nasal spray Place 1 spray into both nostrils 2 (two) times daily. 12/30/20   Janell Quietonklin, Erica R, PA-C  ?predniSONE (DELTASONE) 20 MG tablet Take 2 tablets (40 mg total) by mouth daily. 12/02/20   Mardella LaymanHagler, Brian, MD  ?promethazine-dextromethorphan (PROMETHAZINE-DM) 6.25-15 MG/5ML syrup Take 5 mLs by mouth 4 (four) times daily as needed for cough. 12/25/20   Ward, Tylene FantasiaJessica Z, PA-C  ? ? ?Family History ?Family History  ?Problem Relation Age of Onset  ? Cancer Mother   ?     cervical  ? Alcohol  abuse Mother   ? Ovarian cysts Mother   ? Other Father   ?     fatty liver  ? Cancer Maternal Grandmother   ?     skin  ? Dementia Maternal Grandmother   ? Dementia Paternal Grandmother   ? Diabetes Paternal Grandfather   ? Dementia Paternal Grandfather   ? Ovarian cysts Sister   ? Ovarian cysts Sister   ? Jaundice Daughter   ? ? ?Social History ?Social History  ? ?Tobacco Use  ? Smoking status: Never  ? Smokeless tobacco: Never  ?Vaping Use  ? Vaping Use: Never used  ?Substance Use Topics  ? Alcohol use: Yes  ?  Comment: occ  ? Drug use: No  ? ? ? ?Allergies   ?Latex and Mineral oil ? ? ?Review of Systems ?Review of Systems ?Per HPI ? ?Physical Exam ?Triage Vital Signs ?ED Triage Vitals  ?Enc Vitals Group  ?   BP 04/20/21 1640 129/85  ?   Pulse Rate 04/20/21 1640 82  ?   Resp 04/20/21 1640 18  ?   Temp  04/20/21 1640 98.2 ?F (36.8 ?C)  ?   Temp Source 04/20/21 1640 Oral  ?   SpO2 04/20/21 1640 98 %  ?   Weight --   ?   Height --   ?   Head Circumference --   ?   Peak Flow --   ?   Pain Score 04/20/21 1641 2  ?   Pain Loc --   ?   Pain Edu? --   ?   Excl. in GC? --   ? ?No data found. ? ?Updated Vital Signs ?BP 129/85 (BP Location: Right Arm)   Pulse 82   Temp 98.2 ?F (36.8 ?C) (Oral)   Resp 18   LMP 04/10/2021 (Approximate)   SpO2 98%  ? ?Visual Acuity ?Right Eye Distance:   ?Left Eye Distance:   ?Bilateral Distance:   ? ?Right Eye Near:   ?Left Eye Near:    ?Bilateral Near:    ? ?Physical Exam ?Vitals and nursing note reviewed.  ?Constitutional:   ?   Appearance: Normal appearance. She is not ill-appearing.  ?HENT:  ?   Head: Atraumatic.  ?   Nose: Nose normal.  ?   Mouth/Throat:  ?   Mouth: Mucous membranes are moist.  ?   Pharynx: Posterior oropharyngeal erythema present. No oropharyngeal exudate.  ?   Comments: No significant tonsillar edema, uvula midline, oral airway patent ?Eyes:  ?   Extraocular Movements: Extraocular movements intact.  ?   Conjunctiva/sclera: Conjunctivae normal.  ?Cardiovascular:  ?   Rate and Rhythm: Normal rate and regular rhythm.  ?   Heart sounds: Normal heart sounds.  ?Pulmonary:  ?   Effort: Pulmonary effort is normal.  ?   Breath sounds: Normal breath sounds.  ?Musculoskeletal:     ?   General: Normal range of motion.  ?   Cervical back: Normal range of motion and neck supple.  ?Lymphadenopathy:  ?   Cervical: No cervical adenopathy.  ?Skin: ?   General: Skin is warm and dry.  ?Neurological:  ?   Mental Status: She is alert and oriented to person, place, and time.  ?Psychiatric:     ?   Mood and Affect: Mood normal.     ?   Thought Content: Thought content normal.     ?   Judgment: Judgment normal.  ? ? ? ?UC Treatments / Results  ?  Labs ?(all labs ordered are listed, but only abnormal results are displayed) ?Labs Reviewed  ?CULTURE, GROUP A STREP [facility])  ?POCT RAPID STREP A  (OFFICE)  ? ? ?EKG ? ? ?Radiology ?No results found. ? ?Procedures ?Procedures (including critical care time) ? ?Medications Ordered in UC ?Medications - No data to display ? ?Initial Impression / Assessment and Plan / UC Course  ?I have reviewed the triage vital signs and the nursing notes. ? ?Pertinent labs & imaging results that were available during my care of the patient were reviewed by me and considered in my medical decision making (see chart for details). ? ?  ? ?Vitals and exam reassuring, rapid strep negative, throat culture pending.  Discussed viscous lidocaine, supportive over-the-counter medications and home care.  Return for acutely worsening symptoms. ? ?Final Clinical Impressions(s) / UC Diagnoses  ? ?Final diagnoses:  ?Acute pharyngitis, unspecified etiology  ? ?Discharge Instructions   ?None ?  ? ?ED Prescriptions   ? ? Medication Sig Dispense Auth. Provider  ? lidocaine (XYLOCAINE) 2 % solution Use as directed 10 mLs in the mouth or throat every 3 (three) hours as needed for mouth pain. 100 mL Particia Nearing, [place]  ? ?  ? ?PDMP not reviewed this encounter. ?  ?Particia Nearing, PA-C ?04/20/21 1850 ? ?

## 2021-04-23 LAB — CULTURE, GROUP A STREP (THRC)

## 2021-04-25 ENCOUNTER — Ambulatory Visit
Admission: RE | Admit: 2021-04-25 | Discharge: 2021-04-25 | Disposition: A | Discharge status: Home / Self Care | Payer: Medicaid Other | Source: Ambulatory Visit | Attending: Family Medicine | Admitting: Family Medicine

## 2021-04-25 VITALS — BP 115/81 | HR 85 | Temp 98.3°F | Resp 20

## 2021-04-25 DIAGNOSIS — N760 Acute vaginitis: Secondary | ICD-10-CM | POA: Insufficient documentation

## 2021-04-25 MED ORDER — FLUCONAZOLE 150 MG PO TABS: 150.0000 mg | ORAL_TABLET | ORAL | 0 refills | Status: DC

## 2021-04-25 MED ORDER — METRONIDAZOLE 500 MG PO TABS: 500.0000 mg | ORAL_TABLET | Freq: Two times a day (BID) | ORAL | 0 refills | Status: DC

## 2021-04-25 NOTE — ED Triage Notes (Signed)
Pt presents with c/o yellow foul smelling vaginal discharge for past 3 days  ?

## 2021-04-25 NOTE — ED Provider Notes (Signed)
?[facility] ? ? ? ?CSN: [id number] ?Arrival date & time: 04/25/21  1656 ? ? ?  ? ?History   ?Chief Complaint ?Chief Complaint  ?Patient presents with  ? Vaginal Discharge  ?  Entered by patient  ? ? ?HPI ?[name] Brown is a 31 y.o. female.  ? ?Presenting today with 3-day history of yellow malodorous vaginal discharge.  Denies itching, irritation, pelvic pain, abdominal pain, nausea vomiting, fevers.  Does have a new sexual partner.  Not trying anything over-the-counter for symptoms.  History of BV and yeast infections. ? ? ?Past Medical History:  ?Diagnosis Date  ? 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 05/12/20 pt aware and needs colpo per ASCCP guidelines, has immediate risk of CIN 3+ of 4.4 %, will get colpo, before getting IUD placed   ? Chronic abdominal pain   ? Colitis   ? Counseling for birth control, oral contraceptives 06/26/2014  ? Medical history non-contributory   ? Missed period 06/26/2014  ? ? ?Patient Active Problem List  ? Diagnosis Date Noted  ? 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 Nail abnormality 05/06/2020  ? Depression with anxiety 05/06/2020  ? Bacteremia due to Escherichia coli 05/05/2019  ? 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  ? 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/2017  ? Status post primary low transverse cesarean section 04/14/2017  ? BV (bacterial vaginosis) 09/11/2016  ? Abdominal pain 04/22/2014  ? ? ?Past Surgical History:  ?Procedure Laterality Date  ? CESAREAN SECTION N/A 04/13/2017  ? Procedure: CESAREAN SECTION;  Surgeon: Breda Bing, MD;  Location: [facility] BIRTHING SUITES;  Service: Obstetrics;  Laterality: N/A;  ? NO PAST SURGERIES    ? ? ?OB History   ? ? Gravida  ?1  ? Para  ?1  ? Term  ?1  ? Preterm  ?0  ? AB  ?0  ? Living  ?1  ?  ? ? SAB  ?0  ? IAB  ?0  ? Ectopic  ?0  ? Multiple  ?0  ? Live Births  ?1  ?   ?  ?  ? ? ? ?Home Medications   ? ?Prior to Admission medications   ?Medication Sig Start Date End Date Taking? Authorizing Provider   ?fluconazole (DIFLUCAN) 150 MG tablet Take 1 tablet (150 mg total) by mouth once a week. 04/25/21  Yes Particia Nearing, PA-C  ?metroNIDAZOLE (FLAGYL) 500 MG tablet Take 1 tablet (500 mg total) by mouth 2 (two) times daily. 04/25/21  Yes Particia Nearing, PA-C  ?amoxicillin (AMOXIL) 500 MG capsule Take 1 capsule (500 mg total) by mouth 2 (two) times daily. 04/05/21   Wallis Bamberg, PA-C  ?doxycycline (VIBRAMYCIN) 100 MG capsule Take 1 capsule (100 mg total) by mouth 2 (two) times daily. 12/30/20   Janell Quiet, PA-C  ?lidocaine (XYLOCAINE) 2 % solution Use as directed 10 mLs in the mouth or throat every 3 (three) hours as needed for mouth pain. 04/20/21   Particia Nearing, PA-C  ?oxymetazoline (AFRIN) 0.05 % nasal spray Place 1 spray into both nostrils 2 (two) times daily. 12/30/20   Janell Quiet, PA-C  ?predniSONE (DELTASONE) 20 MG tablet Take 2 tablets (40 mg total) by mouth daily. 12/02/20   Mardella Layman, MD  ?promethazine-dextromethorphan (PROMETHAZINE-DM) 6.25-15 MG/5ML syrup Take 5 mLs by mouth 4 (four) times daily as needed for cough. 12/25/20   Ward, Tylene Fantasia, PA-C  ? ? ?Family History ?Family History  ?Problem Relation Age of  Onset  ? Cancer Mother   ?     cervical  ? Alcohol abuse Mother   ? Ovarian cysts Mother   ? Other Father   ?     fatty liver  ? Cancer Maternal Grandmother   ?     skin  ? Dementia Maternal Grandmother   ? Dementia Paternal Grandmother   ? Diabetes Paternal Grandfather   ? Dementia Paternal Grandfather   ? Ovarian cysts Sister   ? Ovarian cysts Sister   ? Jaundice Daughter   ? ? ?Social History ?Social History  ? ?Tobacco Use  ? Smoking status: Never  ? Smokeless tobacco: Never  ?Vaping Use  ? Vaping Use: Never used  ?Substance Use Topics  ? Alcohol use: Yes  ?  Comment: occ  ? Drug use: No  ? ? ? ?Allergies   ?Latex and Mineral oil ? ? ?Review of Systems ?Review of Systems ?Per HPI ? ?Physical Exam ?Triage Vital Signs ?ED Triage Vitals  ?Enc Vitals Group  ?   BP  04/25/21 1822 115/81  ?   Pulse Rate 04/25/21 1822 85  ?   Resp 04/25/21 1822 20  ?   Temp 04/25/21 1822 98.3 ?F (36.8 ?C)  ?   Temp src --   ?   SpO2 04/25/21 1822 98 %  ?   Weight --   ?   Height --   ?   Head Circumference --   ?   Peak Flow --   ?   Pain Score 04/25/21 1821 0  ?   Pain Loc --   ?   Pain Edu? --   ?   Excl. in GC? --   ? ?No data found. ? ?Updated Vital Signs ?BP 115/81   Pulse 85   Temp 98.3 ?F (36.8 ?C)   Resp 20   LMP 04/10/2021 (Approximate)   SpO2 98%  ? ?Visual Acuity ?Right Eye Distance:   ?Left Eye Distance:   ?Bilateral Distance:   ? ?Right Eye Near:   ?Left Eye Near:    ?Bilateral Near:    ? ?Physical Exam ?Vitals and nursing note reviewed.  ?Constitutional:   ?   Appearance: Normal appearance. She is not ill-appearing.  ?HENT:  ?   Head: Atraumatic.  ?Eyes:  ?   Extraocular Movements: Extraocular movements intact.  ?   Conjunctiva/sclera: Conjunctivae normal.  ?Cardiovascular:  ?   Rate and Rhythm: Normal rate and regular rhythm.  ?   Heart sounds: Normal heart sounds.  ?Pulmonary:  ?   Effort: Pulmonary effort is normal.  ?   Breath sounds: Normal breath sounds.  ?Abdominal:  ?   General: Bowel sounds are normal. There is no distension.  ?   Palpations: Abdomen is soft.  ?   Tenderness: There is no abdominal tenderness. There is no right CVA tenderness, left CVA tenderness or guarding.  ?Genitourinary: ?   Comments: GU exam deferred, self swab performed ?Musculoskeletal:     ?   General: Normal range of motion.  ?   Cervical back: Normal range of motion and neck supple.  ?Skin: ?   General: Skin is warm and dry.  ?Neurological:  ?   Mental Status: She is alert and oriented to person, place, and time.  ?Psychiatric:     ?   Mood and Affect: Mood normal.     ?   Thought Content: Thought content normal.     ?   Judgment: Judgment  normal.  ? ? ? ?UC Treatments / Results  ?Labs ?(all labs ordered are listed, but only abnormal results are displayed) ?Labs Reviewed  ?CERVICOVAGINAL  ANCILLARY ONLY  ? ? ?EKG ? ? ?Radiology ?No results found. ? ?Procedures ?Procedures (including critical care time) ? ?Medications Ordered in UC ?Medications - No data to display ? ?Initial Impression / Assessment and Plan / UC Course  ?I have reviewed the triage vital signs and the nursing notes. ? ?Pertinent labs & imaging results that were available during my care of the patient were reviewed by me and considered in my medical decision making (see chart for details). ? ?  ? ?Vaginal swab pending, will treat for BV and yeast while awaiting results.  Discussed abstinence until results return.  Safe sexual practices and good vaginal hygiene reviewed. ?Final Clinical Impressions(s) / UC Diagnoses  ? ?Final diagnoses:  ?Acute vaginitis  ? ?Discharge Instructions   ?None ?  ? ?ED Prescriptions   ? ? Medication Sig Dispense Auth. Provider  ? metroNIDAZOLE (FLAGYL) 500 MG tablet Take 1 tablet (500 mg total) by mouth 2 (two) times daily. 14 tablet Particia Nearing, [place]  ? fluconazole (DIFLUCAN) 150 MG tablet Take 1 tablet (150 mg total) by mouth once a week. 2 tablet Particia Nearing, [place]  ? ?  ? ?PDMP not reviewed this encounter. ?  ?Particia Nearing, PA-C ?04/25/21 1925 ? ?

## 2021-04-26 LAB — CERVICOVAGINAL ANCILLARY ONLY
Bacterial Vaginitis (gardnerella): NEGATIVE
Candida Glabrata: NEGATIVE
Candida Vaginitis: NEGATIVE
Chlamydia: NEGATIVE
Comment: NEGATIVE
Comment: NEGATIVE
Comment: NEGATIVE
Comment: NEGATIVE
Comment: NEGATIVE
Comment: NORMAL
Neisseria Gonorrhea: NEGATIVE
Trichomonas: NEGATIVE

## 2021-04-30 ENCOUNTER — Ambulatory Visit
Admission: RE | Admit: 2021-04-30 | Discharge: 2021-04-30 | Disposition: A | Discharge status: Home / Self Care | Payer: Medicaid Other | Source: Ambulatory Visit | Attending: Family Medicine | Admitting: Family Medicine

## 2021-04-30 VITALS — BP 134/78 | HR 76 | Temp 98.2°F | Resp 20

## 2021-04-30 DIAGNOSIS — Z20818 Contact with and (suspected) exposure to other bacterial communicable diseases: Secondary | ICD-10-CM

## 2021-04-30 DIAGNOSIS — F411 Generalized anxiety disorder: Secondary | ICD-10-CM

## 2021-04-30 DIAGNOSIS — J039 Acute tonsillitis, unspecified: Secondary | ICD-10-CM

## 2021-04-30 LAB — POCT RAPID STREP A (OFFICE): Rapid Strep A Screen: NEGATIVE

## 2021-04-30 MED ORDER — AMOXICILLIN 500 MG PO CAPS: 500.0000 mg | ORAL_CAPSULE | Freq: Two times a day (BID) | ORAL | 0 refills | Status: DC

## 2021-04-30 MED ORDER — HYDROXYZINE HCL 25 MG PO TABS: 25.0000 mg | ORAL_TABLET | Freq: Four times a day (QID) | ORAL | 0 refills | Status: DC

## 2021-04-30 NOTE — ED Triage Notes (Signed)
Pt states she was exposed to Strep on Thursday ? ?Pt states her throat stared hurting yesterday ? ?Denies Meds  ? ?Denies Fever ?

## 2021-04-30 NOTE — ED Provider Notes (Signed)
?[facility] ? ? ? ?CSN: [id number] ?Arrival date & time: 04/30/21  1241 ? ? ?  ? ?History   ?Chief Complaint ?Chief Complaint  ?Patient presents with  ? Sore Throat  ?  Entered by patient  ? ? ?HPI ?[name] Brown is a 31 y.o. female.  ? ?Presenting today with several day history of sore, swollen feeling throat.  Denies cough, congestion, difficulty breathing or swallowing, chest pain, abdominal pain, nausea vomiting or diarrhea.  Has not been trying anything over-the-counter for symptoms thus far.  Recent exposures to strep throat.  She is also complaining of worsened anxiety recently.  She used to take a daily medication for this but had some side effects, wondering if she could start something different for this.  She does have a primary care provider.  Denies suicidal or homicidal ideation at this time. ? ? ?Past Medical History:  ?Diagnosis Date  ? 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 05/12/20 pt aware and needs colpo per ASCCP guidelines, has immediate risk of CIN 3+ of 4.4 %, will get colpo, before getting IUD placed   ? Chronic abdominal pain   ? Colitis   ? Counseling for birth control, oral contraceptives 06/26/2014  ? Medical history non-contributory   ? Missed period 06/26/2014  ? ? ?Patient Active Problem List  ? Diagnosis Date Noted  ? 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 Nail abnormality 05/06/2020  ? Depression with anxiety 05/06/2020  ? Bacteremia due to Escherichia coli 05/05/2019  ? 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  ? 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/2017  ? Status post primary low transverse cesarean section 04/14/2017  ? BV (bacterial vaginosis) 09/11/2016  ? Abdominal pain 04/22/2014  ? ? ?Past Surgical History:  ?Procedure Laterality Date  ? CESAREAN SECTION N/A 04/13/2017  ? Procedure: CESAREAN SECTION;  Surgeon: [employer] [place] BingPickens, Charlie, MD;  [facility];  Service: Obstetrics;  Laterality: N/A;  ? NO PAST SURGERIES    ? ? ?OB History   ? ?  Gravida  ?1  ? Para  ?1  ? Term  ?1  ? Preterm  ?0  ? AB  ?0  ? Living  ?1  ?  ? ? SAB  ?0  ? IAB  ?0  ? Ectopic  ?0  ? Multiple  ?0  ? Live Births  ?1  ?   ?  ?  ? ? ? ?Home Medications   ? ?Prior to Admission medications   ?Medication Sig Start Date End Date Taking? Authorizing Provider  ?hydrOXYzine (ATARAX) 25 MG tablet Take 1 tablet (25 mg total) by mouth every 6 (six) hours. 04/30/21  Yes Particia NearingLane, Verlean Allport Elizabeth, PA-C  ?amoxicillin (AMOXIL) 500 MG capsule Take 1 capsule (500 mg total) by mouth 2 (two) times daily. 04/30/21   Particia NearingLane, Analleli Gierke Elizabeth, PA-C  ?doxycycline (VIBRAMYCIN) 100 MG capsule Take 1 capsule (100 mg total) by mouth 2 (two) times daily. 12/30/20   Janell Quietonklin, Erica R, PA-C  ?fluconazole (DIFLUCAN) 150 MG tablet Take 1 tablet (150 mg total) by mouth once a week. 04/25/21   Particia NearingLane, Odile Veloso Elizabeth, PA-C  ?lidocaine (XYLOCAINE) 2 % solution Use as directed 10 mLs in the mouth or throat every 3 (three) hours as needed for mouth pain. 04/20/21   Particia NearingLane, Dashan Chizmar Elizabeth, PA-C  ?metroNIDAZOLE (FLAGYL) 500 MG tablet Take 1 tablet (500 mg total) by mouth 2 (two) times daily. 04/25/21   Particia NearingLane, Elliotte Marsalis Elizabeth, PA-C  ?oxymetazoline (AFRIN) 0.05 %  nasal spray Place 1 spray into both nostrils 2 (two) times daily. 12/30/20   Janell Quiet, PA-C  ?predniSONE (DELTASONE) 20 MG tablet Take 2 tablets (40 mg total) by mouth daily. 12/02/20   Mardella Layman, MD  ?promethazine-dextromethorphan (PROMETHAZINE-DM) 6.25-15 MG/5ML syrup Take 5 mLs by mouth 4 (four) times daily as needed for cough. 12/25/20   Ward, Tylene Fantasia, PA-C  ? ? ?Family History ?Family History  ?Problem Relation Age of Onset  ? Cancer Mother   ?     cervical  ? Alcohol abuse Mother   ? Ovarian cysts Mother   ? Other Father   ?     fatty liver  ? Cancer Maternal Grandmother   ?     skin  ? Dementia Maternal Grandmother   ? Dementia Paternal Grandmother   ? Diabetes Paternal Grandfather   ? Dementia Paternal Grandfather   ? Ovarian cysts Sister   ? Ovarian cysts  Sister   ? Jaundice Daughter   ? ? ?Social History ?Social History  ? ?Tobacco Use  ? Smoking status: Never  ? Smokeless tobacco: Never  ?Vaping Use  ? Vaping Use: Never used  ?Substance Use Topics  ? Alcohol use: Yes  ?  Comment: occ  ? Drug use: No  ? ? ? ?Allergies   ?Latex and Mineral oil ? ? ?Review of Systems ?Review of Systems ?Per HPI ? ?Physical Exam ?Triage Vital Signs ?ED Triage Vitals  ?Enc Vitals Group  ?   BP 04/30/21 1328 134/78  ?   Pulse Rate 04/30/21 1328 76  ?   Resp 04/30/21 1328 20  ?   Temp 04/30/21 1328 98.2 ?F (36.8 ?C)  ?   Temp Source 04/30/21 1328 Oral  ?   SpO2 04/30/21 1328 98 %  ?   Weight --   ?   Height --   ?   Head Circumference --   ?   Peak Flow --   ?   Pain Score 04/30/21 1325 6  ?   Pain Loc --   ?   Pain Edu? --   ?   Excl. in GC? --   ? ?No data found. ? ?Updated Vital Signs ?BP 134/78 (BP Location: Right Arm)   Pulse 76   Temp 98.2 ?F (36.8 ?C) (Oral)   Resp 20   LMP 04/10/2021 (Approximate)   SpO2 98%  ? ?Visual Acuity ?Right Eye Distance:   ?Left Eye Distance:   ?Bilateral Distance:   ? ?Right Eye Near:   ?Left Eye Near:    ?Bilateral Near:    ? ?Physical Exam ?Vitals and nursing note reviewed.  ?Constitutional:   ?   Appearance: Normal appearance. She is not ill-appearing.  ?HENT:  ?   Head: Atraumatic.  ?   Right Ear: Tympanic membrane normal.  ?   Left Ear: Tympanic membrane normal.  ?   Nose: Nose normal.  ?   Mouth/Throat:  ?   Mouth: Mucous membranes are moist.  ?   Pharynx: Posterior oropharyngeal erythema present. No oropharyngeal exudate.  ?   Comments: Bilateral tonsillar erythema, edema.  No exudates, oral airway patent ?Eyes:  ?   Extraocular Movements: Extraocular movements intact.  ?   Conjunctiva/sclera: Conjunctivae normal.  ?Cardiovascular:  ?   Rate and Rhythm: Normal rate and regular rhythm.  ?   Heart sounds: Normal heart sounds.  ?Pulmonary:  ?   Effort: Pulmonary effort is normal.  ?   Breath sounds:  Normal breath sounds.  ?Musculoskeletal:     ?    General: Normal range of motion.  ?   Cervical back: Normal range of motion and neck supple.  ?Lymphadenopathy:  ?   Cervical: Cervical adenopathy present.  ?Skin: ?   General: Skin is warm and dry.  ?Neurological:  ?   Mental Status: She is alert and oriented to person, place, and time.  ?Psychiatric:     ?   Mood and Affect: Mood normal.     ?   Thought Content: Thought content normal.     ?   Judgment: Judgment normal.  ? ? ? ?UC Treatments / Results  ?Labs ?(all labs ordered are listed, but only abnormal results are displayed) ?Labs Reviewed  ?CULTURE, GROUP A STREP [facility])  ?POCT RAPID STREP A (OFFICE)  ? ? ?EKG ? ? ?Radiology ?No results found. ? ?Procedures ?Procedures (including critical care time) ? ?Medications Ordered in UC ?Medications - No data to display ? ?Initial Impression / Assessment and Plan / UC Course  ?I have reviewed the triage vital signs and the nursing notes. ? ?Pertinent labs & imaging results that were available during my care of the patient were reviewed by me and considered in my medical decision making (see chart for details). ? ?  ? ?Rapid strep negative, throat culture pending but given appearance and household exposure to strep, will start amoxicillin and discussed supportive care and return precautions.  We will start hydroxyzine as needed while she waits to get in with primary care to discuss further treatment of her anxiety and mood swings.  ED precautions if developing any suicidal or homicidal ideation at any time. ? ?Final Clinical Impressions(s) / UC Diagnoses  ? ?Final diagnoses:  ?Acute tonsillitis, unspecified etiology  ?Exposure to strep throat  ?Anxiety state  ? ? ? ?Discharge Instructions   ? ?  ?Follow-up with your primary care provider as soon as possible for a recheck of your symptoms. ? ? ? ?ED Prescriptions   ? ? Medication Sig Dispense Auth. Provider  ? amoxicillin (AMOXIL) 500 MG capsule Take 1 capsule (500 mg total) by mouth 2 (two) times daily. 20 capsule  Particia Nearing, [place]  ? hydrOXYzine (ATARAX) 25 MG tablet Take 1 tablet (25 mg total) by mouth every 6 (six) hours. 12 tablet Particia Nearing, [place]  ? ?  ? ?PDMP not reviewed this encounter. ?  ?[place]

## 2021-04-30 NOTE — Discharge Instructions (Signed)
Follow-up with your primary care provider as soon as possible for a recheck of your symptoms. ?

## 2021-05-03 LAB — CULTURE, GROUP A STREP (THRC)

## 2021-07-07 ENCOUNTER — Ambulatory Visit
Admission: RE | Admit: 2021-07-07 | Discharge: 2021-07-07 | Disposition: A | Discharge status: Home / Self Care | Payer: Medicaid Other | Source: Ambulatory Visit | Attending: Nurse Practitioner | Admitting: Nurse Practitioner

## 2021-07-07 VITALS — BP 114/83 | HR 76 | Temp 98.7°F | Resp 16

## 2021-07-07 DIAGNOSIS — N760 Acute vaginitis: Secondary | ICD-10-CM | POA: Insufficient documentation

## 2021-07-07 DIAGNOSIS — N898 Other specified noninflammatory disorders of vagina: Secondary | ICD-10-CM | POA: Insufficient documentation

## 2021-07-07 MED ORDER — METRONIDAZOLE 0.75 % VA GEL: 1.0000 | Freq: Every day | VAGINAL | 0 refills | Status: AC

## 2021-07-07 MED ORDER — FLUCONAZOLE 150 MG PO TABS: 150.0000 mg | ORAL_TABLET | Freq: Every day | ORAL | 0 refills | Status: DC

## 2021-07-07 NOTE — ED Triage Notes (Signed)
Pt reports white chunky vaginal discharge since this morning; vaginal itching x 2 days.

## 2021-07-07 NOTE — ED Provider Notes (Signed)
[facility]    CSN: [id number] Arrival date & time: 07/07/21  1653      History   Chief Complaint Chief Complaint  Patient presents with   Vaginal Discharge    Entered by patient    HPI [name] Brown is a 31 y.o. female.   The history is provided by the patient.   Presenting today with 1-day history of malodorous vaginal discharge and vaginal itching.  Patient states she left a tampon in too long and developed symptoms.  Patient describes the discharge as thick and white, like "cottage cheese".  Denies itching, irritation, pelvic pain, abdominal pain, nausea vomiting, fevers.  Has not tried anything over-the-counter for symptoms.  History of BV and yeast infections.  Patient declines STI testing today.  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   Nail abnormality 05/06/2020   Depression with anxiety 05/06/2020   Bacteremia due to Escherichia coli 05/05/2019   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/2017   Status post primary low transverse cesarean section 04/14/2017   BV (bacterial vaginosis) 09/11/2016   Abdominal pain 04/22/2014    Past Surgical History:  Procedure Laterality Date   CESAREAN SECTION N/A 04/13/2017   Procedure: CESAREAN SECTION;  Surgeon: Coamo Bing, MD;  Location: [facility] BIRTHING SUITES;  Service: Obstetrics;  Laterality: N/A;   NO PAST SURGERIES      OB History     Gravida  1   Para  1   Term  1   Preterm  0   AB  0   Living  1      SAB   0   IAB  0   Ectopic  0   Multiple  0   Live Births  1            Home Medications    Prior to Admission medications   Medication Sig Start Date End Date Taking? Authorizing Provider  fluconazole (DIFLUCAN) 150 MG tablet Take 1 tablet (150 mg total) by mouth daily. Take 1 tablet today and take the second tablet when you finish the antibiotic gel. 07/07/21  Yes Kajah Santizo-Warren, Sadie Haber, NP  metroNIDAZOLE (METROGEL) 0.75 % vaginal gel Place 1 Applicatorful vaginally at bedtime for 5 days. 07/07/21 07/12/21 Yes Divya Munshi-Warren, Sadie Haber, NP  amoxicillin (AMOXIL) 500 MG capsule Take 1 capsule (500 mg total) by mouth 2 (two) times daily. 04/30/21   Particia Nearing, PA-C  doxycycline (VIBRAMYCIN) 100 MG capsule Take 1 capsule (100 mg total) by mouth 2 (two) times daily. 12/30/20   Janell Quiet, PA-C  hydrOXYzine (ATARAX) 25 MG tablet Take 1 tablet (25 mg total) by mouth every 6 (six) hours. 04/30/21   Particia Nearing, PA-C  lidocaine (XYLOCAINE) 2 % solution Use as directed 10 mLs in the mouth or throat every 3 (three) hours as needed for mouth pain. 04/20/21   Particia Nearing, PA-C  metroNIDAZOLE (FLAGYL) 500 MG tablet Take 1 tablet (500 mg  total) by mouth 2 (two) times daily. 04/25/21   Particia Nearing, PA-C  oxymetazoline (AFRIN) 0.05 % nasal spray Place 1 spray into both nostrils 2 (two) times daily. 12/30/20   Janell Quiet, PA-C  predniSONE (DELTASONE) 20 MG tablet Take 2 tablets (40 mg total) by mouth daily. 12/02/20   Mardella Layman, MD  promethazine-dextromethorphan (PROMETHAZINE-DM) 6.25-15 MG/5ML syrup Take 5 mLs by mouth 4 (four) times daily as needed for cough. 12/25/20   Ward, Tylene Fantasia, PA-C    Family History Family History  Problem Relation Age of Onset   Cancer Mother        cervical   Alcohol abuse Mother    Ovarian cysts Mother    Other Father        fatty liver   Cancer Maternal Grandmother        skin   Dementia Maternal Grandmother     Dementia Paternal Grandmother    Diabetes Paternal Grandfather    Dementia Paternal Grandfather    Ovarian cysts Sister    Ovarian cysts Sister    Jaundice Daughter     Social History Social History   Tobacco Use   Smoking status: Never   Smokeless tobacco: Never  Vaping Use   Vaping Use: Never used  Substance Use Topics   Alcohol use: Yes    Comment: occ   Drug use: No     Allergies   Latex and Mineral oil   Review of Systems Review of Systems Per HPI  Physical Exam Triage Vital Signs ED Triage Vitals  Enc Vitals Group     BP 07/07/21 1703 114/83     Pulse Rate 07/07/21 1703 76     Resp 07/07/21 1703 16     Temp 07/07/21 1703 98.7 F (37.1 C)     Temp Source 07/07/21 1703 Oral     SpO2 07/07/21 1703 96 %     Weight --      Height --      Head Circumference --      Peak Flow --      Pain Score 07/07/21 1702 0     Pain Loc --      Pain Edu? --      Excl. in GC? --    No data found.  Updated Vital Signs BP 114/83 (BP Location: Right Arm)   Pulse 76   Temp 98.7 F (37.1 C) (Oral)   Resp 16   LMP 06/30/2021 (Exact Date)   SpO2 96%   Visual Acuity Right Eye Distance:   Left Eye Distance:   Bilateral Distance:    Right Eye Near:   Left Eye Near:    Bilateral Near:     Physical Exam Vitals and nursing note reviewed.  Constitutional:      Appearance: Normal appearance.  Eyes:     Extraocular Movements: Extraocular movements intact.     Pupils: Pupils are equal, round, and reactive to light.  Cardiovascular:     Rate and Rhythm: Regular rhythm.     Pulses: Normal pulses.     Heart sounds: Normal heart sounds.  Pulmonary:     Effort: Pulmonary effort is normal.     Breath sounds: Normal breath sounds.  Abdominal:     General: Bowel sounds are normal.     Palpations: Abdomen is soft.  Genitourinary:    Comments: GU exam deferred, self swab performed  Musculoskeletal:     Cervical back: Normal range of motion.  Skin:    General: Skin is  warm and dry.  Neurological:     General: No focal deficit present.     Mental Status: She is alert and oriented to person, place, and time.  Psychiatric:        Mood and Affect: Mood normal.        Behavior: Behavior normal.      UC Treatments / Results  Labs (all labs ordered are listed, but only abnormal results are displayed) Labs Reviewed  CERVICOVAGINAL ANCILLARY ONLY    EKG   Radiology No results found.  Procedures Procedures (including critical care time)  Medications Ordered in UC Medications - No data to display  Initial Impression / Assessment and Plan / UC Course  I have reviewed the triage vital signs and the nursing notes.  Pertinent labs & imaging results that were available during my care of the patient were reviewed by me and considered in my medical decision making (see chart for details).    Patient presents for complaints of vaginal symptoms that started 1 day ago.  Patient complains of vaginal discharge, vaginal odor, and vaginal itching.  She declines STI testing today.  Vaginal swab pending, will treat for BV and yeast while awaiting results.  Discussed abstinence until results return.  Safe sexual practices and good vaginal hygiene reviewed. Final Clinical Impressions(s) / UC Diagnoses   Final diagnoses:  Acute vaginitis     Discharge Instructions      Medication as prescribed. Recommend no sexual activity while using medication. For recurrent symptoms, recommend using a boric acid suppository.  You can purchase these over-the-counter.  Insert 1 vaginal suppository at night for 7 days, then once weekly. Recommend using a probiotic or eating yogurt daily. You will be contacted when the results are received. If your results are negative, you will be advised to stop the medication. Follow-up as needed.     ED Prescriptions     Medication Sig Dispense Auth. Provider   fluconazole (DIFLUCAN) 150 MG tablet Take 1 tablet (150 mg total) by  mouth daily. Take 1 tablet today and take the second tablet when you finish the antibiotic gel. 2 tablet Tiernan Millikin-Warren, Sadie Haber, NP   metroNIDAZOLE (METROGEL) 0.75 % vaginal gel Place 1 Applicatorful vaginally at bedtime for 5 days. 75 g Chlora Mcbain-Warren, Sadie Haber, NP      PDMP not reviewed this encounter.   Abran Cantor, NP 07/07/21 1730

## 2021-07-07 NOTE — Discharge Instructions (Addendum)
Medication as prescribed. Recommend no sexual activity while using medication. For recurrent symptoms, recommend using a boric acid suppository.  You can purchase these over-the-counter.  Insert 1 vaginal suppository at night for 7 days, then once weekly. Recommend using a probiotic or eating yogurt daily. You will be contacted when the results are received. If your results are negative, you will be advised to stop the medication. Follow-up as needed.

## 2021-07-08 LAB — CERVICOVAGINAL ANCILLARY ONLY
Bacterial Vaginitis (gardnerella): NEGATIVE
Candida Glabrata: NEGATIVE
Candida Vaginitis: NEGATIVE
Chlamydia: NEGATIVE
Comment: NEGATIVE
Comment: NEGATIVE
Comment: NEGATIVE
Comment: NEGATIVE
Comment: NEGATIVE
Comment: NORMAL
Neisseria Gonorrhea: NEGATIVE
Trichomonas: NEGATIVE

## 2021-09-04 ENCOUNTER — Ambulatory Visit
Admission: RE | Admit: 2021-09-04 | Discharge: 2021-09-04 | Disposition: A | Discharge status: Home / Self Care | Payer: Medicaid Other | Source: Ambulatory Visit | Attending: Nurse Practitioner | Admitting: Nurse Practitioner

## 2021-09-04 VITALS — BP 111/75 | HR 70 | Temp 99.0°F | Resp 18 | Ht 64.0 in | Wt 155.0 lb

## 2021-09-04 DIAGNOSIS — M25562 Pain in left knee: Secondary | ICD-10-CM

## 2021-09-04 DIAGNOSIS — N898 Other specified noninflammatory disorders of vagina: Secondary | ICD-10-CM

## 2021-09-04 MED ORDER — IBUPROFEN 800 MG PO TABS: 800.0000 mg | ORAL_TABLET | Freq: Three times a day (TID) | ORAL | 0 refills | Status: DC | PRN

## 2021-09-04 MED ORDER — KETOROLAC TROMETHAMINE 30 MG/ML IJ SOLN
30.0000 mg | Freq: Once | INTRAMUSCULAR | Status: AC
  Administered 2021-09-04: 30 mg via INTRAMUSCULAR

## 2021-09-04 MED ORDER — FLUCONAZOLE 150 MG PO TABS: 150.0000 mg | ORAL_TABLET | Freq: Every day | ORAL | 0 refills | Status: DC

## 2021-09-04 NOTE — Discharge Instructions (Addendum)
-   The knee pain you are having is likely due to a strain.  Please wear the compression wrap, rest, elevate, and ice it today. -We have given you a shot of Toradol today to help with pain.  Please do not take any NSAIDs today including ibuprofen, Aleve, or Advil.  You can use Tylenol 500 to 1000 mg every 6 hours as needed for pain. -Starting tomorrow, alternate ibuprofen 800 mg every 8 hours with food and Tylenol 500 to 1000 mg every 6 hours as needed for pain -Regarding the vaginal discharge, this sounds like yeast infection.  Please take the fluconazole.  We will call you if the vaginal testing comes back showing a different type of treatment as needed. -Follow-up with [facility]; contact information provided, if your pain worsens despite this treatment

## 2021-09-04 NOTE — ED Provider Notes (Signed)
[facility]    CSN: [id number] Arrival date & time: 09/04/21  0953      History   Chief Complaint Chief Complaint  Patient presents with   Knee Injury    Entered by patient   Appointment    HPI [name] Brown is a 31 y.o. female.   Patient presents with over 1 week of left knee pain.  She denies any known injury, fall, accident, or trauma to the left knee.  Reports that initially began hurting when she was walking on the treadmill at the gym.  Reports she continued going to the gym even that was hurting, and her knee "buckled" on her.  She denies any loud locking, popping, or sensation of giving way/weakness with weightbearing since this time.  Reports the pain is below the kneecap.  It is worse with movement and worse with weightbearing.  Denies radiation of pain to the ankle or up to the hip.  Has been taking ibuprofen 600 mg with minimal relief.  Has also used over-the-counter compression brace with some relief.  Endorses swelling, however denies redness or bruising, numbness or tingling down her leg or in her toes.  No fevers or nausea/vomiting.  She is now having pain in her right knee secondary to using it more because her left knee has been hurting.  Also having vaginal discharge for the past couple of weeks.  Reports it is a thin, clumpy discharge.  Reports she is also having external vaginal irritation.  Denies dysuria, urinary frequency, concern for STI, abdominal pain, rashes, sores, or lesions on her genitalia.  Reports history of yeast infections.  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   Nail abnormality 05/06/2020   Depression with anxiety 05/06/2020   Bacteremia due to Escherichia coli 05/05/2019   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/2017   Status post primary low transverse cesarean section 04/14/2017   BV (bacterial vaginosis) 09/11/2016   Abdominal pain 04/22/2014    Past Surgical History:  Procedure Laterality Date   CESAREAN SECTION N/A 04/13/2017   Procedure: CESAREAN SECTION;  Surgeon: [place] Bing, MD;  Location: [facility];  Service: Obstetrics;  Laterality: N/A;   NO PAST SURGERIES      OB History     Gravida  1   Para  1   Term  1   Preterm  0   AB  0   Living  1      SAB  0   IAB  0   Ectopic  0   Multiple  0   Live Births  1            Home Medications    Prior to Admission medications   Medication Sig Start Date End Date Taking? Authorizing Provider  ibuprofen (ADVIL) 800 MG tablet Take 1 tablet (800 mg total) by mouth every 8 (eight)  hours as needed. Take with food to prevent GI upset 09/04/21  Yes Cathlean MarseillesMartinez, Sheral Pfahler A, NP  fluconazole (DIFLUCAN) 150 MG tablet Take 1 tablet (150 mg total) by mouth daily. 09/04/21   Valentino NoseMartinez, Kyden Potash A, NP  hydrOXYzine (ATARAX) 25 MG tablet Take 1 tablet (25 mg total) by mouth every 6 (six) hours. 04/30/21   Particia NearingLane, Rachel Elizabeth, PA-C  lidocaine (XYLOCAINE) 2 % solution Use as directed 10 mLs in the mouth or throat every 3 (three) hours as needed for mouth pain. 04/20/21   Particia NearingLane, Rachel Elizabeth, PA-C  oxymetazoline (AFRIN) 0.05 % nasal spray Place 1 spray into both nostrils 2 (two) times daily. 12/30/20   Janell Quietonklin, Erica R, PA-C    Family History Family History  Problem Relation Age of Onset   Cancer Mother        cervical   Alcohol abuse Mother    Ovarian cysts Mother    Other  Father        fatty liver   Cancer Maternal Grandmother        skin   Dementia Maternal Grandmother    Dementia Paternal Grandmother    Diabetes Paternal Grandfather    Dementia Paternal Grandfather    Ovarian cysts Sister    Ovarian cysts Sister    Jaundice Daughter     Social History Social History   Tobacco Use   Smoking status: Never   Smokeless tobacco: Never  Vaping Use   Vaping Use: Never used  Substance Use Topics   Alcohol use: Yes    Comment: occ   Drug use: No     Allergies   Latex and Mineral oil   Review of Systems Review of Systems Per HPI  Physical Exam Triage Vital Signs ED Triage Vitals  Enc Vitals Group     BP 09/04/21 1020 111/75     Pulse Rate 09/04/21 1020 70     Resp 09/04/21 1020 18     Temp 09/04/21 1020 99 F (37.2 C)     Temp Source 09/04/21 1020 Oral     SpO2 09/04/21 1020 98 %     Weight 09/04/21 1022 155 lb (70.3 kg)     Height 09/04/21 1022 5\' 4"  (1.626 m)     Head Circumference --      Peak Flow --      Pain Score 09/04/21 1022 9     Pain Loc --      Pain Edu? --      Excl. in GC? --    No data found.  Updated Vital Signs BP 111/75 (BP Location: Right Arm)   Pulse 70   Temp 99 F (37.2 C) (Oral)   Resp 18   Ht 5\' 4"  (1.626 m)   Wt 155 lb (70.3 kg)   LMP 08/29/2021   SpO2 98%   BMI 26.61 kg/m   Visual Acuity Right Eye Distance:   Left Eye Distance:   Bilateral Distance:    Right Eye Near:   Left Eye Near:    Bilateral Near:     Physical Exam Vitals and nursing note reviewed.  Constitutional:      General: She is not in acute distress.    Appearance: Normal appearance. She is not toxic-appearing.  HENT:     Mouth/Throat:     Mouth: Mucous membranes are moist.     Pharynx: Oropharynx is clear.  Pulmonary:     Effort: Pulmonary effort is normal. No respiratory distress.  Genitourinary:  Comments: Deferred-self swab obtained Musculoskeletal:       Legs:     Comments: Inspection: Mild swelling  inferior and laterally to the left patella in the area marked;  no obvious deformity, redness, bruising.  No obvious swelling, deformity, redness, or bruising to the right knee. Palpation: bilateral knees tender to palpation along the medial joint line.  No obvious deformities palpated. ROM: Full passive range of motion bilateral knees; no laxity appreciated, although examination difficult secondary to pain Strength: 5/5 bilateral lower extremities Neurovascular: neurovascularly intact in left and right lower extremity   Skin:    General: Skin is warm and dry.     Capillary Refill: Capillary refill takes less than 2 seconds.     Coloration: Skin is not jaundiced or pale.     Findings: No erythema.  Neurological:     Mental Status: She is alert and oriented to person, place, and time.  Psychiatric:        Behavior: Behavior is cooperative.      UC Treatments / Results  Labs (all labs ordered are listed, but only abnormal results are displayed) Labs Reviewed  CERVICOVAGINAL ANCILLARY ONLY    EKG   Radiology No results found.  Procedures Procedures (including critical care time)  Medications Ordered in UC Medications  ketorolac (TORADOL) 30 MG/ML injection 30 mg (30 mg Intramuscular Given 09/04/21 1100)    Initial Impression / Assessment and Plan / UC Course  I have reviewed the triage vital signs and the nursing notes.  Pertinent labs & imaging results that were available during my care of the patient were reviewed by me and considered in my medical decision making (see chart for details).    Patient is a pleasant, well-appearing 31 year old female presenting for knee pain worse on the left today and vaginal discharge.  Suspect knee strain; discussed Ace wrap, rest, elevation, ice.  Toradol 30 mg IM given today for pain.  Discussed no use of NSAIDs today, can use Tylenol today.  Starting tomorrow, alternate ibuprofen and Tylenol.  Discussed follow-up with orthopedic  provider if symptoms persist or worsen despite treatment; contact information given.  Regarding vaginal discharge, symptoms are consistent with a yeast infection.  Treat with Diflucan while self swab results are pending.  Patient declines STI testing today including HIV and syphilis testing.  Encouraged condom use with every sexual encounter.  Final Clinical Impressions(s) / UC Diagnoses   Final diagnoses:  Acute pain of left knee  Vaginal discharge     Discharge Instructions      - The knee pain you are having is likely due to a strain.  Please wear the compression wrap, rest, elevate, and ice it today. -We have given you a shot of Toradol today to help with pain.  Please do not take any NSAIDs today including ibuprofen, Aleve, or Advil.  You can use Tylenol 500 to 1000 mg every 6 hours as needed for pain. -Starting tomorrow, alternate ibuprofen 800 mg every 8 hours with food and Tylenol 500 to 1000 mg every 6 hours as needed for pain -Regarding the vaginal discharge, this sounds like yeast infection.  Please take the fluconazole.  We will call you if the vaginal testing comes back showing a different type of treatment as needed. -Follow-up with [facility]; contact information provided, if your pain worsens despite this treatment    ED Prescriptions     Medication Sig Dispense Auth. Provider   fluconazole (DIFLUCAN) 150 MG tablet Take 1 tablet (150 mg  total) by mouth daily. 1 tablet Cathlean Marseilles A, NP   ibuprofen (ADVIL) 800 MG tablet Take 1 tablet (800 mg total) by mouth every 8 (eight) hours as needed. Take with food to prevent GI upset 21 tablet Valentino Nose, NP      PDMP not reviewed this encounter.   Valentino Nose, NP 09/04/21 1118

## 2021-09-04 NOTE — ED Triage Notes (Signed)
Patient c/o bilateral knee pain x 1 week.  No apparent injury.  Patient has been elevating knees and taken Ibuprofen.  Patient c/o vaginal discharge x 2 weeks, no odor, white and clumpy.  Some itching.

## 2021-09-05 LAB — CERVICOVAGINAL ANCILLARY ONLY
Bacterial Vaginitis (gardnerella): NEGATIVE
Candida Glabrata: NEGATIVE
Candida Vaginitis: NEGATIVE
Chlamydia: NEGATIVE
Comment: NEGATIVE
Comment: NEGATIVE
Comment: NEGATIVE
Comment: NEGATIVE
Comment: NEGATIVE
Comment: NORMAL
Neisseria Gonorrhea: NEGATIVE
Trichomonas: NEGATIVE

## 2021-09-27 ENCOUNTER — Ambulatory Visit
Admission: RE | Admit: 2021-09-27 | Discharge: 2021-09-27 | Disposition: A | Discharge status: Home / Self Care | Payer: Medicaid Other | Source: Ambulatory Visit | Attending: Family Medicine | Admitting: Family Medicine

## 2021-09-27 VITALS — BP 107/67 | HR 72 | Temp 98.1°F | Resp 18

## 2021-09-27 DIAGNOSIS — J069 Acute upper respiratory infection, unspecified: Secondary | ICD-10-CM | POA: Insufficient documentation

## 2021-09-27 DIAGNOSIS — Z20822 Contact with and (suspected) exposure to covid-19: Secondary | ICD-10-CM | POA: Insufficient documentation

## 2021-09-27 LAB — RESP PANEL BY RT-PCR (FLU A&B, COVID) ARPGX2
Influenza A by PCR: NEGATIVE
Influenza B by PCR: NEGATIVE
SARS Coronavirus 2 by RT PCR: NEGATIVE

## 2021-09-27 NOTE — ED Triage Notes (Signed)
Sore throat on Sunday and some sinus pressure with nasal congestion.

## 2021-09-27 NOTE — ED Provider Notes (Signed)
[facility]    CSN: [id number] Arrival date & time: 09/27/21  1051      History   Chief Complaint Chief Complaint  Patient presents with   Sore Throat    Entered by patient    HPI Rubi L Eckles is a 31 y.o. female.   Patient presenting today with 2-day history of sore throat, sinus pressure, nasal congestion.  Denies fever, chills, chest pain, shortness of breath, abdominal pain, nausea vomiting or diarrhea.  Taking Benadryl and ibuprofen with mild relief of symptoms.  Daughter sick with similar symptoms.  No known pertinent chronic medical problems.    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   Nail abnormality 05/06/2020   Depression with anxiety 05/06/2020   Bacteremia due to Escherichia coli 05/05/2019   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/2017   Status post primary low transverse cesarean section 04/14/2017   BV (bacterial vaginosis) 09/11/2016   Abdominal pain 04/22/2014    Past Surgical History:  Procedure Laterality Date   CESAREAN SECTION N/A 04/13/2017   Procedure: CESAREAN SECTION;  Surgeon: [place] Bing, MD;  Location: [facility];  Service: Obstetrics;  Laterality: N/A;   NO PAST SURGERIES      OB History     Gravida  1   Para  1   Term  1   Preterm  0   AB  0   Living  1      SAB  0   IAB  0   Ectopic  0   Multiple  0   Live Births  1            Home Medications    Prior to Admission medications   Medication Sig  Start Date End Date Taking? Authorizing Provider  fluconazole (DIFLUCAN) 150 MG tablet Take 1 tablet (150 mg total) by mouth daily. 09/04/21   Valentino Nose, NP  hydrOXYzine (ATARAX) 25 MG tablet Take 1 tablet (25 mg total) by mouth every 6 (six) hours. 04/30/21   Particia Nearing, PA-C  ibuprofen (ADVIL) 800 MG tablet Take 1 tablet (800 mg total) by mouth every 8 (eight) hours as needed. Take with food to prevent GI upset 09/04/21   Cathlean Marseilles A, NP  lidocaine (XYLOCAINE) 2 % solution Use as directed 10 mLs in the mouth or throat every 3 (three) hours as needed for mouth pain. 04/20/21   Particia Nearing, PA-C  oxymetazoline (AFRIN) 0.05 % nasal spray Place 1 spray into both nostrils 2 (two) times daily. 12/30/20   Janell Quiet, PA-C    Family History Family History  Problem Relation Age of Onset   Cancer Mother        cervical   Alcohol abuse Mother    Ovarian cysts Mother    Other Father        fatty liver   Cancer Maternal Grandmother        skin   Dementia  Maternal Grandmother    Dementia Paternal Grandmother    Diabetes Paternal Grandfather    Dementia Paternal Grandfather    Ovarian cysts Sister    Ovarian cysts Sister    Jaundice Daughter     Social History Social History   Tobacco Use   Smoking status: Never   Smokeless tobacco: Never  Vaping Use   Vaping Use: Never used  Substance Use Topics   Alcohol use: Yes    Comment: occ   Drug use: No     Allergies   Latex and Mineral oil   Review of Systems Review of Systems Per HPI  Physical Exam Triage Vital Signs ED Triage Vitals  Enc Vitals Group     BP 09/27/21 1139 107/67     Pulse Rate 09/27/21 1139 72     Resp 09/27/21 1139 18     Temp 09/27/21 1139 98.1 F (36.7 C)     Temp Source 09/27/21 1139 Oral     SpO2 09/27/21 1139 99 %     Weight --      Height --      Head Circumference --      Peak Flow --      Pain Score 09/27/21 1142 5     Pain Loc --      Pain Edu? --       Excl. in GC? --    No data found.  Updated Vital Signs BP 107/67 (BP Location: Right Arm)   Pulse 72   Temp 98.1 F (36.7 C) (Oral)   Resp 18   LMP 09/26/2021 (Exact Date)   SpO2 99%   Visual Acuity Right Eye Distance:   Left Eye Distance:   Bilateral Distance:    Right Eye Near:   Left Eye Near:    Bilateral Near:     Physical Exam Vitals and nursing note reviewed.  Constitutional:      Appearance: Normal appearance.  HENT:     Head: Atraumatic.     Right Ear: Tympanic membrane and external ear normal.     Left Ear: Tympanic membrane and external ear normal.     Nose: Rhinorrhea present.     Mouth/Throat:     Mouth: Mucous membranes are moist.     Pharynx: Posterior oropharyngeal erythema present.  Eyes:     Extraocular Movements: Extraocular movements intact.     Conjunctiva/sclera: Conjunctivae normal.  Cardiovascular:     Rate and Rhythm: Normal rate and regular rhythm.     Heart sounds: Normal heart sounds.  Pulmonary:     Effort: Pulmonary effort is normal.     Breath sounds: Normal breath sounds. No wheezing or rales.  Musculoskeletal:        General: Normal range of motion.     Cervical back: Normal range of motion and neck supple.  Skin:    General: Skin is warm and dry.  Neurological:     Mental Status: She is alert and oriented to person, place, and time.  Psychiatric:        Mood and Affect: Mood normal.        Thought Content: Thought content normal.      UC Treatments / Results  Labs (all labs ordered are listed, but only abnormal results are displayed) Labs Reviewed  RESP PANEL BY RT-PCR (FLU A&B, COVID) [id number]    EKG   Radiology No results found.  Procedures Procedures (including critical care time)  Medications Ordered in UC Medications -  No data to display  Initial Impression / Assessment and Plan / UC Course  I have reviewed the triage vital signs and the nursing notes.  Pertinent labs & imaging results that were  available during my care of the patient were reviewed by me and considered in my medical decision making (see chart for details).     As and exam reassuring and suspicious for viral upper respiratory infection.  Respiratory panel pending, treat with supportive over-the-counter medications and home care.  Return for worsening symptoms.  Final Clinical Impressions(s) / UC Diagnoses   Final diagnoses:  Viral URI   Discharge Instructions   None    ED Prescriptions   None    PDMP not reviewed this encounter.   Particia Nearing, [place] 09/27/21 1216

## 2021-10-03 ENCOUNTER — Ambulatory Visit: Payer: Self-pay

## 2021-10-03 ENCOUNTER — Ambulatory Visit
Admission: RE | Admit: 2021-10-03 | Discharge: 2021-10-03 | Disposition: A | Discharge status: Home / Self Care | Payer: Self-pay | Source: Ambulatory Visit | Attending: Nurse Practitioner | Admitting: Nurse Practitioner

## 2021-10-03 VITALS — BP 104/71 | HR 102 | Temp 99.6°F | Resp 22

## 2021-10-03 DIAGNOSIS — R590 Localized enlarged lymph nodes: Secondary | ICD-10-CM | POA: Insufficient documentation

## 2021-10-03 DIAGNOSIS — R509 Fever, unspecified: Secondary | ICD-10-CM | POA: Insufficient documentation

## 2021-10-03 DIAGNOSIS — J029 Acute pharyngitis, unspecified: Secondary | ICD-10-CM | POA: Insufficient documentation

## 2021-10-03 LAB — POCT RAPID STREP A (OFFICE): Rapid Strep A Screen: NEGATIVE

## 2021-10-03 MED ORDER — PENICILLIN G BENZATHINE 1200000 UNIT/2ML IM SUSY
1.2000 10*6.[IU] | PREFILLED_SYRINGE | Freq: Once | INTRAMUSCULAR | Status: AC
  Administered 2021-10-03: 1.2 10*6.[IU] via INTRAMUSCULAR

## 2021-10-03 MED ORDER — LIDOCAINE VISCOUS HCL 2 % MT SOLN: 10.0000 mL | OROMUCOSAL | 0 refills | Status: DC | PRN

## 2021-10-03 NOTE — Discharge Instructions (Addendum)
-   We have given you a shot of penicillin today to treat for strep throat -Please start the lidocaine rinses every 3 hours as needed for pain in your mouth/throat -Increase hydration, you can use Pedialyte popsicles -Continue Tylenol/ibuprofen as needed for fever and throat pain -If symptoms worsen, go to ER

## 2021-10-03 NOTE — ED Triage Notes (Signed)
Pt presents with nasal congestion and sore throat that began last week, reports fever this weekend

## 2021-10-03 NOTE — ED Provider Notes (Signed)
[facility]    CSN: [id number] Arrival date & time: 10/03/21  5170      History   Chief Complaint Chief Complaint  Patient presents with   Sore Throat    Entered by patient   Nasal Congestion    HPI [name] Brown is a 31 y.o. female.   Patient presents today with 8 days of symptoms.  She endorses worsening sore throat, nasal congestion, and fever over the weekend.  Also endorses swollen glands, headache, sinus pressure, bilateral ear pressure, and decreased appetite.  Patient reports that hurts terribly to swallow and she has been having difficulty drinking fluids because it hurts so bad.  Initially, she was taking Sudafed and Mucinex which was helping at first, however symptoms have worsened.  She denies shortness of breath, wheezing, chest pain or tightness, chest or nasal congestion, runny nose, sneezing, tooth pain, abdominal pain, nausea/vomiting, diarrhea.  Reports it has been a couple of days since she has eaten anything solid.  No new rash.  She was seen in urgent care about 1 week ago, tested for COVID-19 which is negative.    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   Nail abnormality 05/06/2020   Depression with anxiety 05/06/2020   Bacteremia due to Escherichia coli 05/05/2019   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/2017   Status post primary low transverse cesarean section 04/14/2017   BV (bacterial vaginosis) 09/11/2016   Abdominal pain 04/22/2014    Past Surgical  History:  Procedure Laterality Date   CESAREAN SECTION N/A 04/13/2017   Procedure: CESAREAN SECTION;  Surgeon:  Bing, MD;  Location: [facility] BIRTHING SUIT[facility];  Service: Obstetrics;  Laterality: N/A;   NO PAST SURGERIES      OB History     Gravida  1   Para  1   Term  1   Preterm  0   AB  0   Living  1      SAB  0   IAB  0   Ectopic  0   Multiple  0   Live Births  1            Home Medications    Prior to Admission medications   Medication Sig Start Date End Date Taking? Authorizing Provider  hydrOXYzine (ATARAX) 25 MG tablet Take 1 tablet (25 mg total) by mouth every 6 (six) hours. 04/30/21   Particia Nearing, PA-C  ibuprofen (ADVIL) 800 MG tablet Take 1 tablet (800 mg total) by mouth every 8 (eight) hours as needed. Take with food to prevent GI upset 09/04/21   Cathlean Marseilles A, NP  lidocaine (XYLOCAINE) 2 % solution Use as directed 10 mLs in the mouth or throat every 3 (three) hours as needed for mouth pain. 10/03/21   Valentino Nose, NP  oxymetazoline (AFRIN) 0.05 % nasal spray Place 1 spray into both nostrils 2 (two) times daily. 12/30/20   Janell Quiet, PA-C  Family History Family History  Problem Relation Age of Onset   Cancer Mother        cervical   Alcohol abuse Mother    Ovarian cysts Mother    Other Father        fatty liver   Cancer Maternal Grandmother        skin   Dementia Maternal Grandmother    Dementia Paternal Grandmother    Diabetes Paternal Grandfather    Dementia Paternal Grandfather    Ovarian cysts Sister    Ovarian cysts Sister    Jaundice Daughter     Social History Social History   Tobacco Use   Smoking status: Never   Smokeless tobacco: Never  Vaping Use   Vaping Use: Never used  Substance Use Topics   Alcohol use: Yes    Comment: occ   Drug use: No     Allergies   Latex and Mineral oil   Review of Systems Review of Systems Per HPI  Physical Exam Triage Vital Signs ED Triage  Vitals  Enc Vitals Group     BP 10/03/21 1051 104/71     Pulse Rate 10/03/21 1051 (!) 102     Resp 10/03/21 1051 (!) 22     Temp 10/03/21 1051 99.6 F (37.6 C)     Temp src --      SpO2 10/03/21 1051 98 %     Weight --      Height --      Head Circumference --      Peak Flow --      Pain Score 10/03/21 1050 10     Pain Loc --      Pain Edu? --      Excl. in [place]? --    No data found.  Updated Vital Signs BP 104/71   Pulse (!) 102   Temp 99.6 F (37.6 C)   Resp (!) 22   LMP 09/26/2021 (Exact Date)   SpO2 98%   Visual Acuity Right Eye Distance:   Left Eye Distance:   Bilateral Distance:    Right Eye Near:   Left Eye Near:    Bilateral Near:     Physical Exam Vitals and nursing note reviewed.  Constitutional:      General: She is not in acute distress.    Appearance: She is well-developed. She is not ill-appearing, toxic-appearing or diaphoretic.  HENT:     Head: Normocephalic and atraumatic.     Right Ear: Tympanic membrane and ear canal normal. No drainage, swelling or tenderness. No middle ear effusion. Tympanic membrane is not erythematous.     Left Ear: Ear canal normal. Swelling present. No drainage or tenderness. A middle ear effusion is present. Tympanic membrane is erythematous.     Nose: No congestion or rhinorrhea.     Mouth/Throat:     Mouth: Mucous membranes are moist.     Pharynx: Oropharynx is clear. Uvula midline. Posterior oropharyngeal erythema and uvula swelling present. No oropharyngeal exudate.     Tonsils: No tonsillar exudate. 3+ on the right. 3+ on the left.  Eyes:     Extraocular Movements:     Right eye: Normal extraocular motion.     Left eye: Normal extraocular motion.  Cardiovascular:     Rate and Rhythm: Regular rhythm. Tachycardia present.  Pulmonary:     Effort: Pulmonary effort is normal. No respiratory distress.     Breath sounds: Normal breath sounds. No wheezing, rhonchi or rales.  Musculoskeletal:     Cervical back: Normal  range of motion and neck supple.  Lymphadenopathy:     Cervical: Cervical adenopathy present.  Skin:    General: Skin is warm and dry.     Capillary Refill: Capillary refill takes less than 2 seconds.     Coloration: Skin is not pale.     Findings: No erythema or rash.  Neurological:     Mental Status: She is alert and oriented to person, place, and time.  Psychiatric:        Behavior: Behavior is cooperative.      UC Treatments / Results  Labs (all labs ordered are listed, but only abnormal results are displayed) Labs Reviewed  CULTURE, GROUP A STREP [facility])  POCT RAPID STREP A (OFFICE)    EKG   Radiology No results found.  Procedures Procedures (including critical care time)  Medications Ordered in UC Medications  penicillin g benzathine (BICILLIN LA) 1200000 UNIT/2ML injection 1.2 Million Units (1.2 Million Units Intramuscular Given 10/03/21 1120)    Initial Impression / Assessment and Plan / UC Course  I have reviewed the triage vital signs and the nursing notes.  Pertinent labs & imaging results that were available during my care of the patient were reviewed by me and considered in my medical decision making (see chart for details).    Patient is a pleasant, well-appearing 31 year old female presenting for acute pharyngitis and fever.  In triage, she is normotensive, borderline febrile, slightly tachycardic, slightly tachypneic, likely related to fever.  She is oxygenating well on room air.  Rapid strep throat test is negative.  Examination and history consistent with Streptococcus pharyngitis.  We will treat as such given cervical lymphadenopathy, no fever, examination.  We will treat with penicillin g 1,200,000 units IM in urgent care per patient request.  This should also likely cover for potential ear infection.  Supportive care discussed.  ER precautions and return precautions discussed.  Note given for work. The patient was given the opportunity to ask questions.   All questions answered to their satisfaction.  The patient is in agreement to this plan.    Final Clinical Impressions(s) / UC Diagnoses   Final diagnoses:  Acute pharyngitis, unspecified etiology  Cervical lymphadenopathy  Fever, unspecified     Discharge Instructions      - We have given you a shot of penicillin today to treat for strep throat -Please start the lidocaine rinses every 3 hours as needed for pain in your mouth/throat -Increase hydration, you can use Pedialyte popsicles -Continue Tylenol/ibuprofen as needed for fever and throat pain -If symptoms worsen, go to ER   ED Prescriptions     Medication Sig Dispense Auth. Provider   lidocaine (XYLOCAINE) 2 % solution Use as directed 10 mLs in the mouth or throat every 3 (three) hours as needed for mouth pain. 100 mL Valentino Nose, NP      PDMP not reviewed this encounter.   Valentino Nose, NP 10/03/21 1200

## 2021-10-05 LAB — CULTURE, GROUP A STREP (THRC)

## 2021-11-19 ENCOUNTER — Ambulatory Visit
Admission: EM | Admit: 2021-11-19 | Discharge: 2021-11-19 | Disposition: A | Discharge status: Home / Self Care | Payer: Medicaid Other | Attending: Nurse Practitioner | Admitting: Nurse Practitioner

## 2021-11-19 DIAGNOSIS — J069 Acute upper respiratory infection, unspecified: Secondary | ICD-10-CM

## 2021-11-19 MED ORDER — PSEUDOEPH-BROMPHEN-DM 30-2-10 MG/5ML PO SYRP: 5.0000 mL | ORAL_SOLUTION | Freq: Four times a day (QID) | ORAL | 0 refills | Status: DC | PRN

## 2021-11-19 MED ORDER — FLUTICASONE PROPIONATE 50 MCG/ACT NA SUSP: 2.0000 | Freq: Every day | NASAL | 0 refills | Status: DC

## 2021-11-19 MED ORDER — CETIRIZINE HCL 10 MG PO TABS: 10.0000 mg | ORAL_TABLET | Freq: Every day | ORAL | 0 refills | Status: DC

## 2021-11-19 MED ORDER — AZITHROMYCIN 250 MG PO TABS: 250.0000 mg | ORAL_TABLET | Freq: Every day | ORAL | 0 refills | Status: DC

## 2021-11-19 NOTE — ED Provider Notes (Signed)
[facility]    CSN: [id number] Arrival date & time: 11/19/21  0904      History   Chief Complaint Chief Complaint  Patient presents with   Cough    HPI [name] Brown is a 31 y.o. female.   The history is provided by the patient.   Patient presents with a 1 week history of cough and chest congestion.  Patient denies fever, chills, headache, wheezing, shortness of breath, or difficulty breathing.  Patient states she has been taking Robitussin for her symptoms with some relief.  She states that her daughter has been sick with the same or similar symptoms.  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   Nail abnormality 05/06/2020   Depression with anxiety 05/06/2020   Bacteremia due to Escherichia coli 05/05/2019   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/2017   Status post primary low transverse cesarean section 04/14/2017   BV (bacterial vaginosis) 09/11/2016   Abdominal pain 04/22/2014    Past Surgical History:  Procedure Laterality Date   CESAREAN SECTION N/A 04/13/2017   Procedure: CESAREAN SECTION;  Surgeon: [place] Bing, MD;  Location: [facility]IRTHING SUIT[facility];  Service: Obstetrics;  Laterality: N/A;   NO PAST SURGERIES      OB History     Gravida  1   Para  1   Term  1   Preterm  0   AB  0   Living  1      SAB  0   IAB  0   Ectopic  0   Multiple  0   Live Births  1            Home Medications    Prior to Admission medications   Medication Sig  Start Date End Date Taking? Authorizing Provider  hydrOXYzine (ATARAX) 25 MG tablet Take 1 tablet (25 mg total) by mouth every 6 (six) hours. 04/30/21   Particia Nearing, PA-C  ibuprofen (ADVIL) 800 MG tablet Take 1 tablet (800 mg total) by mouth every 8 (eight) hours as needed. Take with food to prevent GI upset 09/04/21   Cathlean Marseilles A, NP  lidocaine (XYLOCAINE) 2 % solution Use as directed 10 mLs in the mouth or throat every 3 (three) hours as needed for mouth pain. 10/03/21   Valentino Nose, NP  oxymetazoline (AFRIN) 0.05 % nasal spray Place 1 spray into both nostrils 2 (two) times daily. 12/30/20   Janell Quiet, PA-C    Family History Family History  Problem Relation Age of Onset   Cancer Mother        cervical   Alcohol abuse Mother    Ovarian cysts Mother    Other Father        fatty liver   Cancer Maternal Grandmother        skin   Dementia Maternal Grandmother    Dementia Paternal Grandmother    Diabetes Paternal Grandfather    Dementia Paternal  Grandfather    Ovarian cysts Sister    Ovarian cysts Sister    Jaundice Daughter     Social History Social History   Tobacco Use   Smoking status: Never   Smokeless tobacco: Never  Vaping Use   Vaping Use: Never used  Substance Use Topics   Alcohol use: Yes    Comment: occ   Drug use: No     Allergies   Latex and Mineral oil   Review of Systems Review of Systems Per HPI  Physical Exam Triage Vital Signs ED Triage Vitals  Enc Vitals Group     BP 11/19/21 0920 111/79     Pulse Rate 11/19/21 0920 79     Resp 11/19/21 0920 18     Temp 11/19/21 0920 98.7 F (37.1 C)     Temp Source 11/19/21 0920 Oral     SpO2 11/19/21 0920 98 %     Weight --      Height --      Head Circumference --      Peak Flow --      Pain Score 11/19/21 0919 0     Pain Loc --      Pain Edu? --      Excl. in [place]? --    No data found.  Updated Vital Signs BP 111/79 (BP Location: Right Arm)   Pulse 79   Temp 98.7  F (37.1 C) (Oral)   Resp 18   LMP  (Within Months) Comment: 1 month  SpO2 98%   Visual Acuity Right Eye Distance:   Left Eye Distance:   Bilateral Distance:    Right Eye Near:   Left Eye Near:    Bilateral Near:     Physical Exam Vitals and nursing note reviewed.  Constitutional:      General: She is not in acute distress.    Appearance: Normal appearance.  HENT:     Head: Normocephalic.     Right Ear: Tympanic membrane, ear canal and external ear normal.     Left Ear: Tympanic membrane, ear canal and external ear normal.     Nose: Congestion present.     Right Turbinates: Enlarged and swollen.     Left Turbinates: Enlarged and swollen.     Right Sinus: No maxillary sinus tenderness or frontal sinus tenderness.     Left Sinus: No maxillary sinus tenderness or frontal sinus tenderness.     Comments: Cobblestoning present on posterior tongue    Mouth/Throat:     Lips: Pink.     Mouth: Mucous membranes are moist.     Pharynx: Pharyngeal swelling and posterior oropharyngeal erythema present. No oropharyngeal exudate.     Tonsils: 1+ on the right. 1+ on the left.  Eyes:     Extraocular Movements: Extraocular movements intact.     Conjunctiva/sclera: Conjunctivae normal.     Pupils: Pupils are equal, round, and reactive to light.  Cardiovascular:     Rate and Rhythm: Normal rate and regular rhythm.     Pulses: Normal pulses.     Heart sounds: Normal heart sounds.  Pulmonary:     Effort: Pulmonary effort is normal. No respiratory distress.     Breath sounds: Normal breath sounds. No stridor. No wheezing, rhonchi or rales.  Abdominal:     General: Bowel sounds are normal.     Palpations: Abdomen is soft.     Tenderness: There is no abdominal tenderness.  Musculoskeletal:     Cervical  back: Normal range of motion.  Lymphadenopathy:     Cervical: No cervical adenopathy.  Skin:    General: Skin is warm and dry.  Neurological:     General: No focal deficit present.      Mental Status: She is alert and oriented to person, place, and time.  Psychiatric:        Mood and Affect: Mood normal.        Behavior: Behavior normal.      UC Treatments / Results  Labs (all labs ordered are listed, but only abnormal results are displayed) Labs Reviewed - No data to display  EKG   Radiology No results found.  Procedures Procedures (including critical care time)  Medications Ordered in UC Medications - No data to display  Initial Impression / Assessment and Plan / UC Course  I have reviewed the triage vital signs and the nursing notes.  Pertinent labs & imaging results that were available during my care of the patient were reviewed by me and considered in my medical decision making (see chart for details).  Patient presents with a 1 week history of cough and chest congestion.  Patient's vital signs are stable, she is in no acute distress.  Symptoms likely of viral etiology, likely a viral upper respiratory infection with cough.  Will treat patient with Bromfed cough syrup, cetirizine 10 mg to help with congestion, and fluticasone nasal spray for her nasal congestion.  Supportive care recommendations were provided to the patient.  Patient was advised that if symptoms worsen over the next 5 days, an antibiotic has been sent to the pharmacy.  Azithromycin 250 mg has been sent for the patient to pick up on 11/24/21.  Patient verbalizes understanding.  All questions were answered.  Patient is stable for discharge. Final Clinical Impressions(s) / UC Diagnoses   Final diagnoses:  None   Discharge Instructions   None    ED Prescriptions   None    PDMP not reviewed this encounter.   Abran Cantor, NP 11/19/21 [phone]

## 2021-11-19 NOTE — Discharge Instructions (Addendum)
Take medication as prescribed. Increase fluids and allow for plenty of rest. Recommend Tylenol or ibuprofen as needed for pain, fever, or general discomfort. Warm salt water gargles 3-4 times daily to help with throat pain or discomfort. As discussed, if your symptoms do not improve by 11/24/2021, pick up your prescription for the antibiotic at your preferred pharmacy. Recommend using a humidifier at bedtime during sleep to help with cough and nasal congestion. Sleep elevated on 2 pillows while cough symptoms persist. Follow-up with your primary care physician if symptoms do not improve after treatment.

## 2021-11-19 NOTE — ED Triage Notes (Signed)
Pt reports cough and chest congestion x 1 week. Robitussin gives some relief.

## 2022-02-10 ENCOUNTER — Other Ambulatory Visit: Payer: Self-pay

## 2022-02-10 ENCOUNTER — Ambulatory Visit
Admission: RE | Admit: 2022-02-10 | Discharge: 2022-02-10 | Disposition: A | Discharge status: Home / Self Care | Payer: Medicaid Other | Source: Ambulatory Visit | Attending: Emergency Medicine | Admitting: Emergency Medicine

## 2022-02-10 VITALS — BP 114/77 | HR 84 | Temp 98.3°F | Resp 20

## 2022-02-10 DIAGNOSIS — M26622 Arthralgia of left temporomandibular joint: Secondary | ICD-10-CM

## 2022-02-10 MED ORDER — PREDNISONE 20 MG PO TABS: 40.0000 mg | ORAL_TABLET | Freq: Every day | ORAL | 0 refills | Status: AC

## 2022-02-10 MED ORDER — NAPROXEN 500 MG PO TABS: 500.0000 mg | ORAL_TABLET | Freq: Two times a day (BID) | ORAL | 0 refills | Status: AC

## 2022-02-10 MED ORDER — CYCLOBENZAPRINE HCL 10 MG PO TABS: 10.0000 mg | ORAL_TABLET | Freq: Every day | ORAL | 0 refills | Status: AC

## 2022-02-10 NOTE — ED Triage Notes (Signed)
Pt reports ear drainage from right ear last night and reports intermittent pain from left ear x2 months. Denies any known fevers or injury.

## 2022-02-10 NOTE — ED Provider Notes (Signed)
HPI  SUBJECTIVE:  [name] Brown is a 32 y.o. female who presents with 2 days of sharp, shooting, constant left ear pain.  States that her jaw hurts.  She has had this intermittently for the past 2 and half months.  No change in hearing, otorrhea, fevers, nasal congestion, rhinorrhea, other upper respiratory symptoms, tinnitus, or vertigo.  She does not grind her teeth.  Denies dental pain.  No antibiotics in the past month.  No antipyretic in the past 6 hours.  She has tried ibuprofen with improvement in her symptoms.  Symptoms worse with exposure to cold air.  She has no past medical history.  LMP: January 1.  Denies possibility being pregnant.  PCP: [facility].  Dentist: None.   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   05/12/20 pt aware and needs colpo per ASCCP guidelines, has immediate risk of CIN 3+ of 4.4 %, will get colpo, before getting IUD placed    Chronic abdominal pain    Colitis    Counseling for birth control, oral contraceptives 06/26/2014   Medical history non-contributory    Missed period 06/26/2014    Past Surgical History:  Procedure Laterality Date   CESAREAN SECTION N/A 04/13/2017   Procedure: CESAREAN SECTION;  Surgeon: Aletha Halim, MD;  Location: [place];  Service: Obstetrics;  Laterality: N/A;   NO PAST SURGERIES      Family History  Problem Relation Age of Onset   Cancer Mother        cervical   Alcohol abuse Mother    Ovarian cysts Mother    Other Father        fatty liver   Cancer Maternal Grandmother        skin   Dementia Maternal Grandmother    Dementia Paternal Grandmother    Diabetes Paternal Grandfather    Dementia Paternal Grandfather    Ovarian cysts Sister    Ovarian cysts Sister    Jaundice Daughter     Social History   Tobacco Use   Smoking status: Never   Smokeless tobacco: Never  Vaping Use   Vaping Use: Never used  Substance Use Topics   Alcohol use: Yes     Comment: occ   Drug use: No    No current facility-administered medications for this encounter.  Current Outpatient Medications:    cyclobenzaprine (FLEXERIL) 10 MG tablet, Take 1 tablet (10 mg total) by mouth at bedtime., Disp: 20 tablet, Rfl: 0   naproxen (NAPROSYN) 500 MG tablet, Take 1 tablet (500 mg total) by mouth 2 (two) times daily., Disp: 20 tablet, Rfl: 0   predniSONE (DELTASONE) 20 MG tablet, Take 2 tablets (40 mg total) by mouth daily with breakfast for 5 days., Disp: 10 tablet, Rfl: 0  Allergies  Allergen Reactions   Latex Other (See Comments)    " Burning and skin swells up"   Mineral Oil Rash    Baby oil     ROS  As noted in HPI.   Physical Exam  BP 114/77 (BP Location: Right Arm)   Pulse 84   Temp 98.3 F (36.8 C) (Oral)   Resp 20   LMP 01/23/2022 (Approximate)   SpO2 98%   Constitutional: Well developed, well nourished, no acute distress Eyes:  EOMI, conjunctiva normal bilaterally HENT: Normocephalic, atraumatic,mucus membranes moist.  Hearing intact and equal in both ears.  Left ear: External ear, EAC, TM normal.  No pain with  traction on pinna, palpation of mastoid.  Pain with palpation of tragus.  Tenderness of the left TMJ.  No crepitus.  Pain aggravated with opening jaw fully.  Right EAC, TM normal. Neck: No cervical lymphadenopathy Respiratory: Normal inspiratory effort Cardiovascular: Normal rate GI: nondistended skin: No rash, skin intact Musculoskeletal: no deformities Neurologic: Alert & oriented x 3, no focal neuro deficits Psychiatric: Speech and behavior appropriate   ED Course   Medications - No data to display  No orders of the defined types were placed in this encounter.   No results found for this or any previous visit (from the past 24 hour(s)). No results found.  ED Clinical Impression  1. Arthralgia of left temporomandibular joint      ED Assessment/Plan     Presentation consistent with TMJ arthralgia.  There is  no evidence of otitis media, otitis externa.  TM perforation.  There is no otorrhea from either ear.  Will send home Naprosyn/Tylenol, Flexeril at night, soft diet for the next few days, 40 mg of prednisone for 5 days.  advised to follow-up with a dentist of choice for a bite guard.  Providing dental resource list  Discussed  MDM, treatment plan, and plan for follow-up with patient.  patient agrees with plan.   Meds ordered this encounter  Medications   cyclobenzaprine (FLEXERIL) 10 MG tablet    Sig: Take 1 tablet (10 mg total) by mouth at bedtime.    Dispense:  20 tablet    Refill:  0   naproxen (NAPROSYN) 500 MG tablet    Sig: Take 1 tablet (500 mg total) by mouth 2 (two) times daily.    Dispense:  20 tablet    Refill:  0   predniSONE (DELTASONE) 20 MG tablet    Sig: Take 2 tablets (40 mg total) by mouth daily with breakfast for 5 days.    Dispense:  10 tablet    Refill:  0      *This clinic note was created using [employer]. Therefore, there may be occasional mistakes despite careful proofreading.  ?    Melynda Ripple, MD 02/10/22 [phone]

## 2022-02-10 NOTE — Discharge Instructions (Signed)
You do not have an ear infection.  This appears to be your jaw joint.  Naprosyn with 1000 mg of Tylenol twice a day, Flexeril to take at night to prevent you from grinding your teeth in your sleep, soft diet for the next 3 to 4 days.  Prednisone will help with pain and swelling.  Please follow-up with a dentist of your choice.

## 2022-02-15 ENCOUNTER — Ambulatory Visit
Admission: RE | Admit: 2022-02-15 | Discharge: 2022-02-15 | Disposition: A | Discharge status: Home / Self Care | Payer: Medicaid Other | Source: Ambulatory Visit | Attending: Nurse Practitioner | Admitting: Nurse Practitioner

## 2022-02-15 VITALS — BP 123/78 | HR 87 | Temp 98.1°F | Resp 15

## 2022-02-15 DIAGNOSIS — N760 Acute vaginitis: Secondary | ICD-10-CM | POA: Insufficient documentation

## 2022-02-15 MED ORDER — FLUCONAZOLE 150 MG PO TABS: 150.0000 mg | ORAL_TABLET | Freq: Every day | ORAL | 0 refills | Status: DC

## 2022-02-15 NOTE — ED Provider Notes (Signed)
[facility]    CSN: [id number] Arrival date & time: 02/15/22  9211      History   Chief Complaint Chief Complaint  Patient presents with   Vaginal Itching    Entered by patient   Appointment    0900    HPI [name] Brown is a 32 y.o. female.   Patient presents today for 2-day history of vaginal discharge.  She describes it as white and thick/clumpy.  No vaginal odor.  Reports she is also having significant itching at the introitus to the vagina.  No dysuria, urinary frequency or urgency.  No abdominal pain, nausea/vomiting, fever, swollen lymph nodes in the groin, pelvic pain, or vaginal sores/rashes/lesions.  Patient is currently sexually active with 1 female partner and is not concerned about STI today.  Declines syphilis and HIV testing.  Patient is confident she is not pregnant; LMP was earlier this month.   Patient reports history of yeast vaginitis and bacterial vaginosis.  Reports this feels like a yeast infection.  Denies recent antibiotic use.  Reports she did recently wear gym leggings longer than she probably should have that were sweaty.    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   Nail abnormality 05/06/2020   Depression with anxiety 05/06/2020   Bacteremia due to Escherichia coli 05/05/2019   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/2017   Status post primary low transverse cesarean section 04/14/2017   BV (bacterial vaginosis) 09/11/2016   Abdominal pain 04/22/2014     Past Surgical History:  Procedure Laterality Date   CESAREAN SECTION N/A 04/13/2017   Procedure: CESAREAN SECTION;  Surgeon: Aletha Halim, MD;  Location: [facility];  Service: Obstetrics;  Laterality: N/A;   NO PAST SURGERIES      OB History     Gravida  1   Para  1   Term  1   Preterm  0   AB  0   Living  1      SAB  0   IAB  0   Ectopic  0   Multiple  0   Live Births  1            Home Medications    Prior to Admission medications   Medication Sig Start Date End Date Taking? Authorizing Provider  fluconazole (DIFLUCAN) 150 MG tablet Take 1 tablet (150 mg total) by mouth daily. 02/15/22  Yes Eulogio Bear, NP  cyclobenzaprine (FLEXERIL) 10 MG tablet Take 1 tablet (10 mg total) by mouth at bedtime. 02/10/22   Melynda Ripple, MD  naproxen (NAPROSYN) 500 MG tablet Take 1 tablet (500 mg total) by mouth 2 (two) times daily. 02/10/22   Melynda Ripple, MD  predniSONE (DELTASONE) 20 MG tablet Take 2 tablets (40 mg total) by mouth daily with breakfast for 5 days. 02/10/22 02/15/22  Melynda Ripple, MD    Family History Family History  Problem Relation Age of Onset   Cancer Mother  cervical   Alcohol abuse Mother    Ovarian cysts Mother    Other Father        fatty liver   Cancer Maternal Grandmother        skin   Dementia Maternal Grandmother    Dementia Paternal Grandmother    Diabetes Paternal Grandfather    Dementia Paternal Grandfather    Ovarian cysts Sister    Ovarian cysts Sister    Jaundice Daughter     Social History Social History   Tobacco Use   Smoking status: Never   Smokeless tobacco: Never  Vaping Use   Vaping Use: Never used  Substance Use Topics   Alcohol use: Yes    Comment: occ   Drug use: No     Allergies   Latex and Mineral oil   Review of Systems Review of Systems Per HPI  Physical Exam Triage Vital Signs ED Triage Vitals  Enc Vitals Group     BP 02/15/22 0956 123/78      Pulse Rate 02/15/22 0956 87     Resp 02/15/22 0956 15     Temp 02/15/22 0956 98.1 F (36.7 C)     Temp Source 02/15/22 0956 Oral     SpO2 02/15/22 0956 99 %     Weight --      Height --      Head Circumference --      Peak Flow --      Pain Score 02/15/22 0955 0     Pain Loc --      Pain Edu? --      Excl. in [place]? --    No data found.  Updated Vital Signs BP 123/78 (BP Location: Right Arm)   Pulse 87   Temp 98.1 F (36.7 C) (Oral)   Resp 15   LMP 01/23/2022 (Approximate)   SpO2 99%   Visual Acuity Right Eye Distance:   Left Eye Distance:   Bilateral Distance:    Right Eye Near:   Left Eye Near:    Bilateral Near:     Physical Exam Vitals and nursing note reviewed.  Constitutional:      General: She is not in acute distress.    Appearance: Normal appearance. She is not toxic-appearing.  Pulmonary:     Effort: Pulmonary effort is normal. No respiratory distress.  Genitourinary:    Comments: Deferred-self swab performed Skin:    General: Skin is warm and dry.     Coloration: Skin is not jaundiced or pale.     Findings: No erythema.  Neurological:     Mental Status: She is alert and oriented to person, place, and time.     Motor: No weakness.     Gait: Gait normal.  Psychiatric:        Mood and Affect: Mood normal.        Behavior: Behavior is cooperative.      UC Treatments / Results  Labs (all labs ordered are listed, but only abnormal results are displayed) Labs Reviewed  CERVICOVAGINAL ANCILLARY ONLY    EKG   Radiology No results found.  Procedures Procedures (including critical care time)  Medications Ordered in UC Medications - No data to display  Initial Impression / Assessment and Plan / UC Course  I have reviewed the triage vital signs and the nursing notes.  Pertinent labs & imaging results that were available during my care of the patient were reviewed by me and considered in my medical decision  making (see chart for details).    Patient is well-appearing, normotensive, afebrile, not tachycardic, not tachypneic, oxygenating well on room air.    Acute vaginitis Symptoms are consistent with yeast vaginitis Will treat with Diflucan 150 mg one-time dose Self swab is pending-will need to treat as indicated if positive for anything besides yeast infection Patient declines HIV and syphilis testing today  The patient was given the opportunity to ask questions.  All questions answered to their satisfaction.  The patient is in agreement to this plan.    Final Clinical Impressions(s) / UC Diagnoses   Final diagnoses:  Acute vaginitis     Discharge Instructions      As we discussed, it sounds like you may have a yeast infection.  Take the diflucan pill - this is one dose and is the full treatment for a yeast infection.  We will call you later this week if the vaginal swab shows we need to treat with an alternate treatment.      ED Prescriptions     Medication Sig Dispense Auth. Provider   fluconazole (DIFLUCAN) 150 MG tablet Take 1 tablet (150 mg total) by mouth daily. 1 tablet Valentino Nose, NP      PDMP not reviewed this encounter.   Valentino Nose, NP 02/15/22 1037

## 2022-02-15 NOTE — ED Triage Notes (Signed)
Pt reports white vaginal discharge and vaginal itching x 2 days.

## 2022-02-15 NOTE — Discharge Instructions (Signed)
As we discussed, it sounds like you may have a yeast infection.  Take the diflucan pill - this is one dose and is the full treatment for a yeast infection.  We will call you later this week if the vaginal swab shows we need to treat with an alternate treatment.

## 2022-02-16 LAB — CERVICOVAGINAL ANCILLARY ONLY
Bacterial Vaginitis (gardnerella): NEGATIVE
Candida Glabrata: NEGATIVE
Candida Vaginitis: NEGATIVE
Chlamydia: NEGATIVE
Comment: NEGATIVE
Comment: NEGATIVE
Comment: NEGATIVE
Comment: NEGATIVE
Comment: NEGATIVE
Comment: NORMAL
Neisseria Gonorrhea: NEGATIVE
Trichomonas: NEGATIVE

## 2022-02-23 ENCOUNTER — Ambulatory Visit
Admission: RE | Admit: 2022-02-23 | Discharge: 2022-02-23 | Disposition: A | Discharge status: Home / Self Care | Payer: Self-pay | Source: Ambulatory Visit | Attending: Family Medicine | Admitting: Family Medicine

## 2022-02-23 VITALS — BP 115/80 | HR 98 | Temp 98.3°F | Resp 18

## 2022-02-23 DIAGNOSIS — J069 Acute upper respiratory infection, unspecified: Secondary | ICD-10-CM | POA: Insufficient documentation

## 2022-02-23 DIAGNOSIS — Z1152 Encounter for screening for COVID-19: Secondary | ICD-10-CM | POA: Insufficient documentation

## 2022-02-23 LAB — POCT RAPID STREP A (OFFICE): Rapid Strep A Screen: NEGATIVE

## 2022-02-23 MED ORDER — FLUTICASONE PROPIONATE 50 MCG/ACT NA SUSP: 1.0000 | Freq: Two times a day (BID) | NASAL | 2 refills | Status: AC

## 2022-02-23 MED ORDER — PROMETHAZINE-DM 6.25-15 MG/5ML PO SYRP: 5.0000 mL | ORAL_SOLUTION | Freq: Four times a day (QID) | ORAL | 0 refills | Status: AC | PRN

## 2022-02-23 NOTE — ED Provider Notes (Signed)
[facility]    CSN: [id number] Arrival date & time: 02/23/22  4235      History   Chief Complaint Chief Complaint  Patient presents with   Sore Throat    HPI [name] Brown is a 32 y.o. female.   Presenting today with 4-day history of sinus drainage, sore throat, hoarseness, cough, bilateral ear pressure and sinus pressure.  Denies fever, chills, body aches, chest pain, shortness of breath, abdominal pain, nausea vomiting or diarrhea.  So far took Mucinex last night and has been taking typical allergy regimen with minimal relief.  No known sick contacts recently.    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   Nail abnormality 05/06/2020   Depression with anxiety 05/06/2020   Bacteremia due to Escherichia coli 05/05/2019   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/2017   Status post primary low transverse cesarean section 04/14/2017   BV (bacterial vaginosis) 09/11/2016   Abdominal pain 04/22/2014    Past Surgical History:  Procedure Laterality Date   CESAREAN SECTION N/A 04/13/2017   Procedure: CESAREAN SECTION;  Surgeon: Aletha Halim, MD;  Location: [place];  Service: Obstetrics;  Laterality: N/A;   NO PAST SURGERIES      OB History     Gravida  1   Para  1   Term  1   Preterm  0   AB  0   Living  1      SAB  0   IAB  0   Ectopic  0   Multiple  0   Live Births  1            Home Medications    Prior to Admission medications    Medication Sig Start Date End Date Taking? Authorizing Provider  fluticasone (FLONASE) 50 MCG/ACT nasal spray Place 1 spray into both nostrils 2 (two) times daily. 02/23/22  Yes Volney American, PA-C  promethazine-dextromethorphan (PROMETHAZINE-DM) 6.25-15 MG/5ML syrup Take 5 mLs by mouth 4 (four) times daily as needed. 02/23/22  Yes Volney American, PA-C  cyclobenzaprine (FLEXERIL) 10 MG tablet Take 1 tablet (10 mg total) by mouth at bedtime. 02/10/22   Melynda Ripple, MD  fluconazole (DIFLUCAN) 150 MG tablet Take 1 tablet (150 mg total) by mouth daily. 02/15/22   Eulogio Bear, NP  naproxen (NAPROSYN) 500 MG tablet Take 1 tablet (500 mg total) by mouth 2 (two) times daily. 02/10/22   Melynda Ripple, MD    Family History Family History  Problem Relation Age of Onset   Cancer Mother        cervical   Alcohol abuse Mother    Ovarian cysts Mother    Other Father        fatty liver   Cancer Maternal Grandmother        skin   Dementia Maternal Grandmother    Dementia Paternal Grandmother    Diabetes Paternal Grandfather    Dementia Paternal Grandfather  Ovarian cysts Sister    Ovarian cysts Sister    Jaundice Daughter     Social History Social History   Tobacco Use   Smoking status: Never   Smokeless tobacco: Never  Vaping Use   Vaping Use: Never used  Substance Use Topics   Alcohol use: Yes    Comment: occ   Drug use: No     Allergies   Latex, Tylenol [acetaminophen], and Mineral oil   Review of Systems Review of Systems Per HPI  Physical Exam Triage Vital Signs ED Triage Vitals  Enc Vitals Group     BP 02/23/22 0932 115/80     Pulse Rate 02/23/22 0932 98     Resp 02/23/22 0932 18     Temp 02/23/22 0932 98.3 F (36.8 C)     Temp Source 02/23/22 0932 Oral     SpO2 02/23/22 0932 99 %     Weight --      Height --      Head Circumference --      Peak Flow --      Pain Score 02/23/22 0933 3     Pain Loc --      Pain Edu? --       Excl. in [place]? --    No data found.  Updated Vital Signs BP 115/80 (BP Location: Right Arm)   Pulse 98   Temp 98.3 F (36.8 C) (Oral)   Resp 18   LMP 02/21/2022 (Approximate)   SpO2 99%   Visual Acuity Right Eye Distance:   Left Eye Distance:   Bilateral Distance:    Right Eye Near:   Left Eye Near:    Bilateral Near:     Physical Exam Vitals and nursing note reviewed.  Constitutional:      Appearance: Normal appearance.  HENT:     Head: Atraumatic.     Right Ear: Tympanic membrane and external ear normal.     Left Ear: Tympanic membrane and external ear normal.     Nose: Rhinorrhea present.     Mouth/Throat:     Mouth: Mucous membranes are moist.     Pharynx: Posterior oropharyngeal erythema present.  Eyes:     Extraocular Movements: Extraocular movements intact.     Conjunctiva/sclera: Conjunctivae normal.  Cardiovascular:     Rate and Rhythm: Normal rate and regular rhythm.     Heart sounds: Normal heart sounds.  Pulmonary:     Effort: Pulmonary effort is normal.     Breath sounds: Normal breath sounds. No wheezing or rales.  Musculoskeletal:        General: Normal range of motion.     Cervical back: Normal range of motion and neck supple.  Skin:    General: Skin is warm and dry.  Neurological:     Mental Status: She is alert and oriented to person, place, and time.  Psychiatric:        Mood and Affect: Mood normal.        Thought Content: Thought content normal.      UC Treatments / Results  Labs (all labs ordered are listed, but only abnormal results are displayed) Labs Reviewed  SARS CORONAVIRUS 2 (TAT 6-24 HRS)  POCT RAPID STREP A (OFFICE)    EKG   Radiology No results found.  Procedures Procedures (including critical care time)  Medications Ordered in UC Medications - No data to display  Initial Impression / Assessment and Plan / UC Course  I have reviewed  the triage vital signs and the nursing notes.  Pertinent labs & imaging  results that were available during my care of the patient were reviewed by me and considered in my medical decision making (see chart for details).     Vitals and exam overall reassuring today and suggestive of a viral upper respiratory infection.  Rapid strep negative, COVID testing pending.  Treat with Phenergan DM, Flonase, supportive over-the-counter medications and home care.  Return for worsening symptoms.  Final Clinical Impressions(s) / UC Diagnoses   Final diagnoses:  Viral URI with cough   Discharge Instructions   None    ED Prescriptions     Medication Sig Dispense Auth. Provider   promethazine-dextromethorphan (PROMETHAZINE-DM) 6.25-15 MG/5ML syrup Take 5 mLs by mouth 4 (four) times daily as needed. 100 mL Volney American, PA-C   fluticasone [facility]) 50 MCG/ACT nasal spray Place 1 spray into both nostrils 2 (two) times daily. 16 g Volney American, [place]      PDMP not reviewed this encounter.   Volney American, [place] 02/23/22 1318

## 2022-02-23 NOTE — ED Triage Notes (Signed)
Pt reports a sore throat and bilateral ear pressure x 4 days. Sinus pressure, hoarse voice and a cough x 1 day. Took mucinex but no relief

## 2022-02-24 LAB — SARS CORONAVIRUS 2 (TAT 6-24 HRS): SARS Coronavirus 2: NEGATIVE

## 2022-06-21 ENCOUNTER — Encounter (HOSPITAL_COMMUNITY): Payer: Self-pay | Admitting: Emergency Medicine

## 2022-06-21 ENCOUNTER — Emergency Department (HOSPITAL_COMMUNITY): Payer: Medicaid Other

## 2022-06-21 ENCOUNTER — Emergency Department (HOSPITAL_COMMUNITY)
Admission: EM | Admit: 2022-06-21 | Discharge: 2022-06-22 | Disposition: A | Discharge status: Home / Self Care | Payer: Medicaid Other | Attending: Emergency Medicine | Admitting: Emergency Medicine

## 2022-06-21 ENCOUNTER — Ambulatory Visit
Admission: EM | Admit: 2022-06-21 | Discharge: 2022-06-21 | Disposition: A | Discharge status: Home / Self Care | Payer: Medicaid Other

## 2022-06-21 ENCOUNTER — Other Ambulatory Visit: Payer: Self-pay

## 2022-06-21 DIAGNOSIS — R519 Headache, unspecified: Secondary | ICD-10-CM | POA: Diagnosis present

## 2022-06-21 DIAGNOSIS — R300 Dysuria: Secondary | ICD-10-CM | POA: Diagnosis not present

## 2022-06-21 DIAGNOSIS — R200 Anesthesia of skin: Secondary | ICD-10-CM | POA: Diagnosis not present

## 2022-06-21 DIAGNOSIS — Z91040 Latex allergy status: Secondary | ICD-10-CM | POA: Insufficient documentation

## 2022-06-21 DIAGNOSIS — M542 Cervicalgia: Secondary | ICD-10-CM | POA: Insufficient documentation

## 2022-06-21 DIAGNOSIS — R0682 Tachypnea, not elsewhere classified: Secondary | ICD-10-CM | POA: Insufficient documentation

## 2022-06-21 DIAGNOSIS — M25512 Pain in left shoulder: Secondary | ICD-10-CM | POA: Insufficient documentation

## 2022-06-21 DIAGNOSIS — R1084 Generalized abdominal pain: Secondary | ICD-10-CM | POA: Diagnosis not present

## 2022-06-21 DIAGNOSIS — R000 Tachycardia, unspecified: Secondary | ICD-10-CM | POA: Diagnosis not present

## 2022-06-21 DIAGNOSIS — R109 Unspecified abdominal pain: Secondary | ICD-10-CM | POA: Diagnosis not present

## 2022-06-21 LAB — CBC
HCT: 40.1 % (ref 36.0–46.0)
Hemoglobin: 13.6 g/dL (ref 12.0–15.0)
MCH: 31.3 pg (ref 26.0–34.0)
MCHC: 33.9 g/dL (ref 30.0–36.0)
MCV: 92.4 fL (ref 80.0–100.0)
Platelets: 254 10*3/uL (ref 150–400)
RBC: 4.34 MIL/uL (ref 3.87–5.11)
RDW: 12.1 % (ref 11.5–15.5)
WBC: 8.7 10*3/uL (ref 4.0–10.5)
nRBC: 0 % (ref 0.0–0.2)

## 2022-06-21 LAB — BASIC METABOLIC PANEL
Anion gap: 11 (ref 5–15)
BUN: 7 mg/dL (ref 6–20)
CO2: 19 mmol/L — ABNORMAL LOW (ref 22–32)
Calcium: 9.2 mg/dL (ref 8.9–10.3)
Chloride: 102 mmol/L (ref 98–111)
Creatinine, Ser: 0.81 mg/dL (ref 0.44–1.00)
GFR, Estimated: >60 mL/min (ref >60)
Glucose, Bld: 108 mg/dL — ABNORMAL HIGH (ref 70–99)
Potassium: 3.4 mmol/L — ABNORMAL LOW (ref 3.5–5.1)
Sodium: 132 mmol/L — ABNORMAL LOW (ref 135–145)

## 2022-06-21 LAB — TROPONIN I (HIGH SENSITIVITY)
Troponin I (High Sensitivity): <2 ng/L (ref <18)
Troponin I (High Sensitivity): <2 ng/L (ref <18)

## 2022-06-21 LAB — D-DIMER, QUANTITATIVE: D-Dimer, Quant: 0.37 ug/mL-FEU (ref 0.00–0.50)

## 2022-06-21 MED ORDER — ONDANSETRON HCL 4 MG/2ML IJ SOLN
4.0000 mg | Freq: Once | INTRAMUSCULAR | Status: AC
  Administered 2022-06-22: 4 mg via INTRAVENOUS
  Filled 2022-06-21: qty 2

## 2022-06-21 MED ORDER — MORPHINE SULFATE (PF) 4 MG/ML IV SOLN
4.0000 mg | Freq: Once | INTRAVENOUS | Status: AC
  Administered 2022-06-22: 4 mg via INTRAVENOUS
  Filled 2022-06-21: qty 1

## 2022-06-21 MED ORDER — SODIUM CHLORIDE 0.9 % IV BOLUS
1000.0000 mL | Freq: Once | INTRAVENOUS | Status: AC
  Administered 2022-06-22: 1000 mL via INTRAVENOUS

## 2022-06-21 MED ORDER — KETOROLAC TROMETHAMINE 30 MG/ML IJ SOLN
30.0000 mg | Freq: Once | INTRAMUSCULAR | Status: AC
  Administered 2022-06-21: 30 mg via INTRAMUSCULAR

## 2022-06-21 NOTE — ED Triage Notes (Signed)
Pt c/o left shoulder pain, injured a year ago with improvement and then spontaneously started hurting again 2 days ago. Last night when she tried to go to sleep she noticed that her shoulder was hurting with pain shooting from upper shoulder into left side of neck. Pt is also reporting chest pain radiating around ribs to back. She seems very anxious in triage and notes that she feels lightheaded and nauseated. Pt is tearful throughout triage assessment.

## 2022-06-21 NOTE — ED Provider Notes (Signed)
[facility] Provider Note   CSN: [id number] Arrival date & time: 06/21/22  1937     History {Add pertinent medical, surgical, social history, OB history to HPI:1} Chief Complaint  Patient presents with   Shoulder Pain    [name] Brown is a 32 y.o. female.  Patient presents to the emergency department with a multitude of problems.  She is complaining of a headache that has been present for approximately 1 day.  She reports pain on the left side of her neck that worsens with movement, goes into the shoulder area.  She does, however, have chronic left shoulder pain after an injury several months ago.  Patient reports that she has been having some lower abdominal pain earlier this morning and now is having diffuse abdominal pain tonight.  She has noticed some pain with urination.  She has not been eating or drinking much today.       Home Medications Prior to Admission medications   Medication Sig Start Date End Date Taking? Authorizing Provider  amphetamine-dextroamphetamine (ADDERALL XR) 10 MG 24 hr capsule Take 10 mg by mouth daily.    [provider]  busPIRone (BUSPAR) 5 MG tablet Take 5 mg by mouth 3 (three) times daily.    [provider]  cyclobenzaprine (FLEXERIL) 10 MG tablet Take 1 tablet (10 mg total) by mouth at bedtime. 02/10/22   Domenick Gong, MD  fluconazole (DIFLUCAN) 150 MG tablet Take 1 tablet (150 mg total) by mouth daily. 02/15/22   Valentino Nose, NP  fluticasone (FLONASE) 50 MCG/ACT nasal spray Place 1 spray into both nostrils 2 (two) times daily. 02/23/22   Particia Nearing, PA-C  naproxen (NAPROSYN) 500 MG tablet Take 1 tablet (500 mg total) by mouth 2 (two) times daily. 02/10/22   Domenick Gong, MD  promethazine-dextromethorphan (PROMETHAZINE-DM) 6.25-15 MG/5ML syrup Take 5 mLs by mouth 4 (four) times daily as needed. 02/23/22   Particia Nearing, PA-C      Allergies    Latex,  Tylenol [acetaminophen], and Mineral oil    Review of Systems   Review of Systems  Physical Exam Updated Vital Signs BP 106/66   Pulse (!) 106   Temp 98.4 F (36.9 C) (Oral)   Resp 19   Ht 5\' 4"  (1.626 m)   Wt 65.8 kg   LMP 06/12/2022 (Approximate)   SpO2 100%   BMI 24.89 kg/m  Physical Exam Vitals and nursing note reviewed.  Constitutional:      General: She is not in acute distress.    Appearance: She is well-developed.  HENT:     Head: Normocephalic and atraumatic.     Mouth/Throat:     Mouth: Mucous membranes are moist.  Eyes:     General: Vision grossly intact. Gaze aligned appropriately.     Extraocular Movements: Extraocular movements intact.     Conjunctiva/sclera: Conjunctivae normal.  Cardiovascular:     Rate and Rhythm: Regular rhythm. Tachycardia present.     Pulses: Normal pulses.     Heart sounds: Normal heart sounds, S1 normal and S2 normal. No murmur heard.    No friction rub. No gallop.  Pulmonary:     Effort: Pulmonary effort is normal. Tachypnea present. No respiratory distress.     Breath sounds: Normal breath sounds.  Abdominal:     General: Bowel sounds are normal.     Palpations: Abdomen is soft.     Tenderness: There is generalized abdominal tenderness.  There is no guarding or rebound.     Hernia: No hernia is present.  Musculoskeletal:        General: No swelling.     Cervical back: Normal range of motion and neck supple. Pain with movement and muscular tenderness present. No spinous process tenderness. Normal range of motion.     Right lower leg: No edema.     Left lower leg: No edema.  Skin:    General: Skin is warm and dry.     Capillary Refill: Capillary refill takes less than 2 seconds.     Findings: No ecchymosis, erythema, rash or wound.  Neurological:     General: No focal deficit present.     Mental Status: She is alert and oriented to person, place, and time.     GCS: GCS eye subscore is 4. GCS verbal subscore is 5. GCS motor  subscore is 6.     Cranial Nerves: Cranial nerves 2-12 are intact.     Sensory: Sensation is intact.     Motor: Motor function is intact.     Coordination: Coordination is intact.  Psychiatric:        Attention and Perception: Attention normal.        Mood and Affect: Mood normal.        Speech: Speech normal.        Behavior: Behavior normal.     ED Results / Procedures / Treatments   Labs (all labs ordered are listed, but only abnormal results are displayed) Labs Reviewed  BASIC METABOLIC PANEL - Abnormal; Notable for the following components:      Result Value   Sodium 132 (*)    Potassium 3.4 (*)    CO2 19 (*)    Glucose, Bld 108 (*)    All other components within normal limits  CBC  D-DIMER, QUANTITATIVE  URINALYSIS, ROUTINE W REFLEX MICROSCOPIC  POC URINE PREG, ED  TROPONIN I (HIGH SENSITIVITY)  TROPONIN I (HIGH SENSITIVITY)    EKG EKG Interpretation  Date/Time:  Wednesday Jun 21 2022 19:53:42 EDT Ventricular Rate:  102 PR Interval:  118 QRS Duration: 80 QT Interval:  318 QTC Calculation: 414 R Axis:   -33 Text Interpretation: Sinus tachycardia Left axis deviation Left ventricular hypertrophy with repolarization abnormality ( R in aVL , Romhilt-Estes ) Abnormal ECG When compared with ECG of 04-May-2019 10:56,no change Confirmed by Gilda Crease [phone]) on 06/21/2022 11:15:21 PM  Radiology DG Chest 2 View  Result Date: 06/21/2022 CLINICAL DATA:  Chest pain and left shoulder pain with left arm weakness. EXAM: CHEST - 2 VIEW COMPARISON:  December 21, 2019 FINDINGS: The heart size and mediastinal contours are within normal limits. Both lungs are clear. The visualized skeletal structures are unremarkable. IMPRESSION: No active cardiopulmonary disease. Electronically Signed   By: Aram Candela M.D.   On: 06/21/2022 20:53    Procedures Procedures  {Document cardiac monitor, telemetry assessment procedure when appropriate:1}  Medications Ordered in  ED Medications  morphine (PF) 4 MG/ML injection 4 mg (has no administration in time range)  ondansetron (ZOFRAN) injection 4 mg (has no administration in time range)  sodium chloride 0.9 % bolus 1,000 mL (has no administration in time range)    ED Course/ Medical Decision Making/ A&P   {   Click here for ABCD2, HEART and other calculatorsREFRESH Note before signing :1}  Medical Decision Making Amount and/or Complexity of Data Reviewed Labs: ordered. Radiology: ordered.  Risk Prescription drug management.   ***  {Document critical care time when appropriate:1} {Document review of labs and clinical decision tools ie heart score, Chads2Vasc2 etc:1}  {Document your independent review of radiology images, and any outside records:1} {Document your discussion with family members, caretakers, and with consultants:1} {Document social determinants of health affecting pt's care:1} {Document your decision making why or why not admission, treatments were needed:1} Final Clinical Impression(s) / ED Diagnoses Final diagnoses:  None    Rx / DC Orders ED Discharge Orders     None

## 2022-06-21 NOTE — Discharge Instructions (Signed)
Go to the emergency department for further evaluation

## 2022-06-21 NOTE — ED Notes (Signed)
Patient is being discharged from the Urgent Care and sent to the Emergency Department via [facility] . Per provider Jackson Latino., patient is in need of higher level of care due to headache, with shoulder pain. Patient is aware and verbalizes understanding of plan of care.  Vitals:   06/21/22 1835  BP: 129/89  Pulse: (!) 105  Resp: 17  Temp: 99.4 F (37.4 C)  SpO2: 99%

## 2022-06-21 NOTE — ED Notes (Signed)
Patient transported to X-ray 

## 2022-06-21 NOTE — ED Triage Notes (Signed)
Pt states she Is having headache like feels like an explosion that started today, neck and back pain. Pt states she had a neck injury a year ago and lifted something heavy 2 months ago and has been having pain since. Today she is having headache, RUQ and LUQ pain with neck and back pain with lower abdominal pain that comes and goes, with chills, pt states lower abdominal pain feels like she needs to have a BM. Took ibuprofen and Excedrin today.

## 2022-06-21 NOTE — ED Provider Notes (Signed)
[facility]    CSN: [id number] Arrival date & time: 06/21/22  1806      History   Chief Complaint Chief Complaint  Patient presents with   Headache    HPI [name] Brown is a 32 y.o. female.   The history is provided by the patient.   The patient presents for complaints of headache, left-sided neck pain, and left-sided back pain.  Patient states "my head feels like an explosion".  Symptoms started over the past 24 hours.  She also reports that she is having pain in her abdomen, and chills.  Patient also complains of light sensitivity and sound sensitivity along with dizziness, lightheadedness, and blurred vision.  Patient reports that she had a neck injury approximately 1 year ago, and about 2 months ago, she lifted something heavy and since that time, she has been experiencing pain.  She states that she was also having numbness in the left upper extremity and has also been experiencing pain in her upper/lower abdomen. Patient denies recent fever, ear pain, ear drainage, sore throat, chest pain, nausea, vomiting, or diarrhea.  Patient is currently stuttering, and states that she begins to stutter when she is stressed out.  She reports that she has taken ibuprofen and Excedrin today with minimal relief.  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   Nail abnormality 05/06/2020   Depression with anxiety 05/06/2020   Bacteremia due to Escherichia coli 05/05/2019   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   Postpartum  hypertension 04/25/2017   Status post primary low transverse cesarean section 04/14/2017   BV (bacterial vaginosis) 09/11/2016   Abdominal pain 04/22/2014    Past Surgical History:  Procedure Laterality Date   CESAREAN SECTION N/A 04/13/2017   Procedure: CESAREAN SECTION;  Surgeon: [place] Bing, MD;  Location: [facility];  Service: Obstetrics;  Laterality: N/A;   NO PAST SURGERIES      OB History     Gravida  1   Para  1   Term  1   Preterm  0   AB  0   Living  1      SAB  0   IAB  0   Ectopic  0   Multiple  0   Live Births  1            Home Medications    Prior to Admission medications   Medication Sig Start Date End Date Taking? Authorizing Provider  amphetamine-dextroamphetamine (ADDERALL XR) 10 MG 24 hr capsule Take 10 mg by mouth daily.   Yes [provider]  busPIRone (BUSPAR) 5 MG tablet Take 5 mg by mouth 3 (three) times daily.   Yes [provider]  cyclobenzaprine (FLEXERIL) 10 MG tablet Take 1 tablet (10 mg total) by mouth at bedtime. 02/10/22   Domenick Gong, MD  fluconazole (DIFLUCAN) 150 MG tablet Take 1 tablet (150 mg total) by mouth daily. 02/15/22   Valentino Nose, NP  fluticasone (FLONASE) 50  MCG/ACT nasal spray Place 1 spray into both nostrils 2 (two) times daily. 02/23/22   Particia Nearing, PA-C  naproxen (NAPROSYN) 500 MG tablet Take 1 tablet (500 mg total) by mouth 2 (two) times daily. 02/10/22   Domenick Gong, MD  promethazine-dextromethorphan (PROMETHAZINE-DM) 6.25-15 MG/5ML syrup Take 5 mLs by mouth 4 (four) times daily as needed. 02/23/22   Particia Nearing, PA-C    Family History Family History  Problem Relation Age of Onset   Cancer Mother        cervical   Alcohol abuse Mother    Ovarian cysts Mother    Other Father        fatty liver   Cancer Maternal Grandmother        skin   Dementia Maternal Grandmother    Dementia Paternal Grandmother    Diabetes Paternal  Grandfather    Dementia Paternal Grandfather    Ovarian cysts Sister    Ovarian cysts Sister    Jaundice Daughter     Social History Social History   Tobacco Use   Smoking status: Never   Smokeless tobacco: Never  Vaping Use   Vaping Use: Never used  Substance Use Topics   Alcohol use: Yes    Comment: occ   Drug use: No     Allergies   Latex, Tylenol [acetaminophen], and Mineral oil   Review of Systems Review of Systems Per HPI  Physical Exam Triage Vital Signs ED Triage Vitals  Enc Vitals Group     BP 06/21/22 1835 129/89     Pulse Rate 06/21/22 1835 (!) 105     Resp 06/21/22 1835 17     Temp 06/21/22 1835 99.4 F (37.4 C)     Temp Source 06/21/22 1835 Oral     SpO2 06/21/22 1835 99 %     Weight --      Height --      Head Circumference --      Peak Flow --      Pain Score 06/21/22 1850 10     Pain Loc --      Pain Edu? --      Excl. in GC? --    No data found.  Updated Vital Signs BP 129/89 (BP Location: Right Arm)   Pulse (!) 105   Temp 99.4 F (37.4 C) (Oral)   Resp 17   LMP 06/12/2022 (Approximate)   SpO2 99%   Visual Acuity Right Eye Distance:   Left Eye Distance:   Bilateral Distance:    Right Eye Near:   Left Eye Near:    Bilateral Near:     Physical Exam Vitals and nursing note reviewed.  Constitutional:      General: She is in acute distress.  HENT:     Head: Normocephalic.     Mouth/Throat:     Mouth: Mucous membranes are moist.  Eyes:     Extraocular Movements: Extraocular movements intact.     Right eye: Normal extraocular motion and no nystagmus.     Left eye: Normal extraocular motion and no nystagmus.     Pupils: Pupils are equal, round, and reactive to light.  Cardiovascular:     Rate and Rhythm: Regular rhythm. Tachycardia present.  Pulmonary:     Effort: Pulmonary effort is normal. No respiratory distress.     Breath sounds: Normal breath sounds. No stridor. No wheezing, rhonchi or rales.  Abdominal:      General: Bowel sounds are normal.  Palpations: Abdomen is soft.  Musculoskeletal:     Cervical back: Normal range of motion.  Skin:    General: Skin is warm and dry.  Neurological:     Mental Status: She is alert.     GCS: GCS eye subscore is 4. GCS verbal subscore is 5. GCS motor subscore is 6.     Cranial Nerves: No cranial nerve deficit or facial asymmetry.     Motor: Weakness (Left upper extremity) present.     Coordination: Coordination normal.     Gait: Gait normal.  Psychiatric:        Mood and Affect: Mood is anxious. Affect is tearful.        Cognition and Memory: Cognition and memory normal.     Comments: Stuttering speech      UC Treatments / Results  Labs (all labs ordered are listed, but only abnormal results are displayed) Labs Reviewed - No data to display  EKG   Radiology No results found.  Procedures Procedures (including critical care time)  Medications Ordered in UC Medications  ketorolac (TORADOL) 30 MG/ML injection 30 mg (30 mg Intramuscular Given 06/21/22 1918)    Initial Impression / Assessment and Plan / UC Course  I have reviewed the triage vital signs and the nursing notes.  Pertinent labs & imaging results that were available during my care of the patient were reviewed by me and considered in my medical decision making (see chart for details).  The patient presents stating she feels like there is "an explosion" in the inside of her head, along with light sensitivity, sound sensitivity, dizziness, lightheadedness, and abdominal pain.  She also complains of pain in the left neck and left shoulder.  Patient is stuttering, is moderately anxious and is tearful.  Patient also complains of numbness in the left upper extremity.  Patient rates her headache 10/10 at present.  Patient was given Toradol 10 mg intramuscularly for pain.  Given the patient's current complaints, recommend patient go to the emergency department for further evaluation.   Patient's father presents to take patient to the emergency department via private vehicle.  Patient's vital signs are stable.  Patient was discharged to the emergency department.   Final Clinical Impressions(s) / UC Diagnoses   Final diagnoses:  Worst headache of life  Neck pain  Left upper extremity numbness  Abdominal pain, unspecified abdominal location     Discharge Instructions      Go to the emergency department for further evaluation.      ED Prescriptions   None    PDMP not reviewed this encounter.   Abran Cantor, NP 06/21/22 2003

## 2022-06-22 ENCOUNTER — Emergency Department (HOSPITAL_COMMUNITY): Payer: Medicaid Other

## 2022-06-22 LAB — URINALYSIS, ROUTINE W REFLEX MICROSCOPIC
Bilirubin Urine: NEGATIVE
Glucose, UA: NEGATIVE mg/dL
Hgb urine dipstick: NEGATIVE
Ketones, ur: 5 mg/dL — AB
Leukocytes,Ua: NEGATIVE
Nitrite: NEGATIVE
Protein, ur: NEGATIVE mg/dL
Specific Gravity, Urine: 1.013 (ref 1.005–1.030)
pH: 8 (ref 5.0–8.0)

## 2022-06-22 MED ORDER — KETOROLAC TROMETHAMINE 30 MG/ML IJ SOLN
30.0000 mg | Freq: Once | INTRAMUSCULAR | Status: AC
  Administered 2022-06-22: 30 mg via INTRAVENOUS
  Filled 2022-06-22: qty 1

## 2022-06-22 MED ORDER — METOCLOPRAMIDE HCL 5 MG/ML IJ SOLN
10.0000 mg | Freq: Once | INTRAMUSCULAR | Status: AC
  Administered 2022-06-22: 10 mg via INTRAVENOUS
  Filled 2022-06-22: qty 2

## 2022-11-09 ENCOUNTER — Ambulatory Visit
Admission: RE | Admit: 2022-11-09 | Discharge: 2022-11-09 | Disposition: A | Discharge status: Home / Self Care | Payer: Medicaid Other | Source: Ambulatory Visit | Attending: Nurse Practitioner | Admitting: Nurse Practitioner

## 2022-11-09 VITALS — BP 117/79 | HR 77 | Temp 98.1°F | Resp 17

## 2022-11-09 DIAGNOSIS — R399 Unspecified symptoms and signs involving the genitourinary system: Secondary | ICD-10-CM | POA: Insufficient documentation

## 2022-11-09 LAB — POCT URINALYSIS DIP (MANUAL ENTRY)
Bilirubin, UA: NEGATIVE
Glucose, UA: NEGATIVE mg/dL
Ketones, POC UA: NEGATIVE mg/dL
Nitrite, UA: NEGATIVE
Spec Grav, UA: 1.02 (ref 1.010–1.025)
Urobilinogen, UA: 1 U/dL
pH, UA: 7.5 (ref 5.0–8.0)

## 2022-11-09 MED ORDER — FLUCONAZOLE 150 MG PO TABS: 150.0000 mg | ORAL_TABLET | Freq: Once | ORAL | 0 refills | Status: AC

## 2022-11-09 MED ORDER — NITROFURANTOIN MONOHYD MACRO 100 MG PO CAPS: 100.0000 mg | ORAL_CAPSULE | Freq: Two times a day (BID) | ORAL | 0 refills | Status: AC

## 2022-11-09 NOTE — ED Provider Notes (Signed)
[facility]    CSN: [id number] Arrival date & time: 11/09/22  0844      History   Chief Complaint Chief Complaint  Patient presents with   Urinary Frequency    Entered by patient    HPI [name] Brown is a 32 y.o. female.   The history is provided by the patient.   Patient presents for complaints of urinary frequency, abdominal pressure, burning with urination, and low back pain.  Symptoms have been present for the past 2 days.  Patient denies fever, chills, headache, hematuria, decreased urine stream, flank pain, or vaginal symptoms.  States she has a history of recurrent UTIs, but patient reports her last UTI was over a year ago.  She denies prior history of pyelonephritis or kidney stones.  LMP 10/25/2022.  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   Nail abnormality 05/06/2020   Depression with anxiety 05/06/2020   Bacteremia due to Escherichia coli 05/05/2019   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/2017   Status post primary low transverse cesarean section 04/14/2017   BV (bacterial vaginosis) 09/11/2016   Abdominal pain 04/22/2014    Past Surgical History:  Procedure Laterality Date   CESAREAN SECTION N/A 04/13/2017   Procedure: CESAREAN SECTION;  Surgeon: [place] Bing, MD;  Location: [facility];  Service: Obstetrics;  Laterality: N/A;   NO PAST SURGERIES      OB History     Gravida  1   Para  1   Term  1   Preterm  0   AB  0   Living  1       SAB  0   IAB  0   Ectopic  0   Multiple  0   Live Births  1            Home Medications    Prior to Admission medications   Medication Sig Start Date End Date Taking? Authorizing Provider  nitrofurantoin, macrocrystal-monohydrate, (MACROBID) 100 MG capsule Take 1 capsule (100 mg total) by mouth 2 (two) times daily. 11/09/22  Yes Kayson Bullis-Warren, Sadie Haber, NP  amphetamine-dextroamphetamine (ADDERALL XR) 10 MG 24 hr capsule Take 10 mg by mouth daily.    [provider]  busPIRone (BUSPAR) 5 MG tablet Take 5 mg by mouth 3 (three) times daily.    [provider]  cyclobenzaprine (FLEXERIL) 10 MG tablet Take 1 tablet (10 mg total) by mouth at bedtime. 02/10/22   Domenick Gong, MD  fluconazole (DIFLUCAN) 150 MG tablet Take 1 tablet (150 mg total) by mouth daily. 02/15/22   Valentino Nose, NP  fluticasone (FLONASE) 50 MCG/ACT nasal spray Place 1 spray into both nostrils 2 (two) times daily. 02/23/22   Particia Nearing, PA-C  naproxen (NAPROSYN) 500 MG tablet Take 1 tablet (500 mg total) by mouth 2 (two) times daily. 02/10/22   Domenick Gong, MD  promethazine-dextromethorphan (PROMETHAZINE-DM) 6.25-15 MG/5ML syrup Take 5 mLs by mouth 4 (four)  times daily as needed. 02/23/22   Particia Nearing, PA-C    Family History Family History  Problem Relation Age of Onset   Cancer Mother        cervical   Alcohol abuse Mother    Ovarian cysts Mother    Other Father        fatty liver   Cancer Maternal Grandmother        skin   Dementia Maternal Grandmother    Dementia Paternal Grandmother    Diabetes Paternal Grandfather    Dementia Paternal Grandfather    Ovarian cysts Sister    Ovarian cysts Sister    Jaundice Daughter     Social History Social History   Tobacco Use   Smoking status: Never   Smokeless tobacco: Never  Vaping Use   Vaping status: Never Used  Substance Use Topics   Alcohol use: Yes    Comment: occ   Drug use: No      Allergies   Latex, Tylenol [acetaminophen], and Mineral oil   Review of Systems Review of Systems   Physical Exam Triage Vital Signs ED Triage Vitals  Encounter Vitals Group     BP 11/09/22 0901 117/79     Systolic BP Percentile --      Diastolic BP Percentile --      Pulse Rate 11/09/22 0901 77     Resp 11/09/22 0901 17     Temp 11/09/22 0901 98.1 F (36.7 C)     Temp Source 11/09/22 0901 Oral     SpO2 11/09/22 0901 98 %     Weight --      Height --      Head Circumference --      Peak Flow --      Pain Score 11/09/22 0903 4     Pain Loc --      Pain Education --      Exclude from Growth Chart --    No data found.  Updated Vital Signs BP 117/79 (BP Location: Right Arm)   Pulse 77   Temp 98.1 F (36.7 C) (Oral)   Resp 17   LMP 10/25/2022 (Within Days)   SpO2 98%   Visual Acuity Right Eye Distance:   Left Eye Distance:   Bilateral Distance:    Right Eye Near:   Left Eye Near:    Bilateral Near:     Physical Exam Vitals and nursing note reviewed.  Constitutional:      General: She is not in acute distress.    Appearance: Normal appearance.  HENT:     Head: Normocephalic.  Eyes:     Extraocular Movements: Extraocular movements intact.     Pupils: Pupils are equal, round, and reactive to light.  Cardiovascular:     Rate and Rhythm: Normal rate and regular rhythm.     Pulses: Normal pulses.     Heart sounds: Normal heart sounds.  Pulmonary:     Effort: Pulmonary effort is normal. No respiratory distress.     Breath sounds: Normal breath sounds. No stridor. No wheezing, rhonchi or rales.  Abdominal:     General: Bowel sounds are normal.     Palpations: Abdomen is soft.     Tenderness: There is abdominal tenderness in the suprapubic area. There is no right CVA tenderness or left CVA tenderness.  Musculoskeletal:     Cervical back: Normal range of motion.  Lymphadenopathy:     Cervical: No cervical adenopathy.  Skin:  General: Skin is warm  and dry.  Neurological:     General: No focal deficit present.     Mental Status: She is alert and oriented to person, place, and time.  Psychiatric:        Mood and Affect: Mood normal.        Behavior: Behavior normal.      UC Treatments / Results  Labs (all labs ordered are listed, but only abnormal results are displayed) Labs Reviewed  POCT URINALYSIS DIP (MANUAL ENTRY) - Abnormal; Notable for the following components:      Result Value   Clarity, UA hazy (*)    Blood, UA trace-intact (*)    Protein Ur, POC trace (*)    Leukocytes, UA Moderate (2+) (*)    All other components within normal limits  URINE CULTURE    EKG   Radiology No results found.  Procedures Procedures (including critical care time)  Medications Ordered in UC Medications - No data to display  Initial Impression / Assessment and Plan / UC Course  I have reviewed the triage vital signs and the nursing notes.  Pertinent labs & imaging results that were available during my care of the patient were reviewed by me and considered in my medical decision making (see chart for details).  The patient is well-appearing, she is in no acute distress, vital signs are stable.  Urinalysis is positive for moderate leukocytes, trace proteins, and trace blood.  Urine culture is pending.  Will treat patient empirically while culture is pending with Macrobid 100 mg twice daily for the next 5 days.  Diflucan 150 mg tablet provided for yeast infection while on antibiotic.  Supportive care recommendations were provided and discussed with the patient to include over-the-counter analgesics, increasing her fluids, developing a toileting schedule, and avoiding caffeine.  Patient was given strict ER follow-up precautions.  Patient was advised that if the culture is negative, and she is continue to experience symptoms, would like for her to follow-up with her primary care physician for further evaluation.  Patient is in agreement  with this plan of care and verbalizes understanding.  All questions were answered.  Patient stable for discharge.   Final Clinical Impressions(s) / UC Diagnoses   Final diagnoses:  UTI symptoms     Discharge Instructions      Take medication as prescribed. May take over-the-counter Tylenol or ibuprofen as needed for pain, fever, or general discomfort. Make sure you are drinking at least 8-10 8 ounce glasses of water daily while symptoms persist. Develop a toileting schedule that will allow you to urinate at least every 2 hours. Avoid caffeine such as tea, soda, or coffee while symptoms persist. If you develop worsening urinary symptoms with new symptoms of fever, chills, worsening abdominal pain, or other concerns, please go to the emergency department immediately. If your urine culture is negative, and you are continuing to experience symptoms, please follow-up with your primary care physician for further evaluation. Follow-up as needed.     ED Prescriptions     Medication Sig Dispense Auth. Provider   nitrofurantoin, macrocrystal-monohydrate, (MACROBID) 100 MG capsule Take 1 capsule (100 mg total) by mouth 2 (two) times daily. 10 capsule Lanell Dubie-Warren, Sadie Haber, NP      PDMP not reviewed this encounter.   Abran Cantor, NP 11/09/22 1002

## 2022-11-09 NOTE — ED Triage Notes (Addendum)
Pt c/o urinary frequency, abdominal pressure  burning with urination and lower back pain x 2 days

## 2022-11-09 NOTE — Discharge Instructions (Signed)
Take medication as prescribed. May take over-the-counter Tylenol or ibuprofen as needed for pain, fever, or general discomfort. Make sure you are drinking at least 8-10 8 ounce glasses of water daily while symptoms persist. Develop a toileting schedule that will allow you to urinate at least every 2 hours. Avoid caffeine such as tea, soda, or coffee while symptoms persist. If you develop worsening urinary symptoms with new symptoms of fever, chills, worsening abdominal pain, or other concerns, please go to the emergency department immediately. If your urine culture is negative, and you are continuing to experience symptoms, please follow-up with your primary care physician for further evaluation. Follow-up as needed.

## 2022-11-11 LAB — URINE CULTURE: Culture: 70000 — AB

## 2022-11-13 ENCOUNTER — Ambulatory Visit
Admission: RE | Admit: 2022-11-13 | Discharge: 2022-11-13 | Disposition: A | Discharge status: Home / Self Care | Payer: Medicaid Other | Source: Ambulatory Visit | Attending: Emergency Medicine | Admitting: Emergency Medicine

## 2022-11-13 VITALS — BP 107/73 | HR 80 | Temp 98.1°F | Resp 18

## 2022-11-13 DIAGNOSIS — N949 Unspecified condition associated with female genital organs and menstrual cycle: Secondary | ICD-10-CM

## 2022-11-13 DIAGNOSIS — Z8619 Personal history of other infectious and parasitic diseases: Secondary | ICD-10-CM

## 2022-11-13 MED ORDER — VALACYCLOVIR HCL 1 G PO TABS: 1000.0000 mg | ORAL_TABLET | Freq: Two times a day (BID) | ORAL | 0 refills | Status: AC

## 2022-11-13 NOTE — ED Triage Notes (Signed)
Pt reports noting "spot" to genitals 4 days ago. Believes is likely an ingrown hair; however had recent cold sore and would like to be sure spot is not herpes. Pt has no concerns for any other STD's.

## 2022-11-13 NOTE — ED Provider Notes (Addendum)
[name]    CSN: [id number] Arrival date & time: 11/13/22  1139    HISTORY   Chief Complaint  Patient presents with   SEXUALLY TRANSMITTED DISEASE    Entered by patient   HPI [name] Brown is a pleasant, 32 y.o. female who presents to urgent care today. Patient complains of a small spot in her perineal area on the left side that appeared 4 days ago.  Patient states she thinks is more likely an ingrown hair but it is very painful to touch and bled yesterday.  Patient is requesting testing for HSV.  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  Patient states she does not currently take any antiviral treatment for her cold sores.  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/12/2020   Nail abnormality 05/06/2020   Depression with anxiety 05/06/2020   Bacteremia due to Escherichia coli 05/05/2019   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/2017   Status post primary low transverse cesarean section 04/14/2017   BV (bacterial vaginosis) 09/11/2016   Abdominal pain 04/22/2014   Past Surgical History:  Procedure Laterality Date   CESAREAN SECTION N/A 04/13/2017   Procedure: CESAREAN SECTION;  Surgeon: Zebulon Bing, MD;  Location: [facility] BIRTHING SUITES;  Service: Obstetrics;  Laterality: N/A;   NO PAST SURGERIES     OB History     Gravida  1   Para  1   Term  1   Preterm  0   AB  0    Living  1      SAB  0   IAB  0   Ectopic  0   Multiple  0   Live Births  1          Home Medications    Prior to Admission medications   Medication Sig Start Date End Date Taking? Authorizing Provider  amphetamine-dextroamphetamine (ADDERALL XR) 10 MG 24 hr capsule Take 10 mg by mouth daily.    [provider]  busPIRone (BUSPAR) 5 MG tablet Take 5 mg by mouth 3 (three) times daily.    [provider]  cyclobenzaprine (FLEXERIL) 10 MG tablet Take 1 tablet (10 mg total) by mouth at bedtime. 02/10/22   Domenick Gong, MD  fluticasone (FLONASE) 50 MCG/ACT nasal spray Place 1 spray into both nostrils 2 (two) times daily. 02/23/22   Particia Nearing, PA-C  naproxen (NAPROSYN) 500 MG tablet Take 1 tablet (500 mg total) by mouth 2 (two) times daily. 02/10/22   Domenick Gong, MD  nitrofurantoin, macrocrystal-monohydrate, (MACROBID) 100 MG capsule Take 1 capsule (100 mg total) by mouth 2 (two) times daily. 11/09/22   Leath-Warren, Sadie Haber, NP  promethazine-dextromethorphan (PROMETHAZINE-DM) 6.25-15 MG/5ML syrup Take 5 mLs by mouth 4 (four) times daily as needed. 02/23/22   Particia Nearing, PA-C    Family History Family History  Problem Relation Age of Onset   Cancer Mother        cervical   Alcohol abuse Mother    Ovarian cysts Mother    Other Father        fatty liver   Cancer Maternal Grandmother        skin   Dementia Maternal Grandmother    Dementia Paternal Grandmother    Diabetes Paternal Grandfather    Dementia Paternal Grandfather    Ovarian cysts Sister    Ovarian cysts Sister    Jaundice Daughter    Social History Social History   Tobacco Use   Smoking status: Never   Smokeless tobacco: Never  Vaping Use   Vaping status: Never Used  Substance Use Topics   Alcohol use: Yes    Comment: occ   Drug use: No   Allergies   Latex, Tylenol [acetaminophen], and Mineral oil  Review of Systems Review of Systems Pertinent  findings revealed after performing a 14 point review of systems has been noted in the history of present illness.  Physical Exam Vital Signs BP 107/73 (BP Location: Right Arm)   Pulse 80   Temp 98.1 F (36.7 C) (Oral)   Resp 18   LMP 10/25/2022 (Within Days)   SpO2 97%   No data found.  Physical Exam Vitals and nursing note reviewed.  Constitutional:      General: She is not in acute distress.    Appearance: Normal appearance.  HENT:     Head: Normocephalic and atraumatic.     Mouth/Throat:     Lips: Pink. No lesions.  Eyes:     Pupils: Pupils are equal, round, and reactive to light.  Cardiovascular:     Rate and Rhythm: Normal rate and regular rhythm.  Pulmonary:     Effort: Pulmonary effort is normal.     Breath sounds: Normal breath sounds.  Genitourinary:   Musculoskeletal:        General: Normal range of motion.     Cervical back: Normal range of motion and neck supple.  Skin:    General: Skin is warm and dry.  Neurological:     General: No focal deficit present.     Mental Status: She is alert and oriented to person, place, and time. Mental status is at baseline.  Psychiatric:        Mood and Affect: Mood normal.        Behavior: Behavior normal.        Thought Content: Thought content normal.        Judgment: Judgment normal.     Visual Acuity Right Eye Distance:   Left Eye Distance:   Bilateral Distance:    Right Eye Near:   Left Eye Near:    Bilateral Near:     UC Couse / Diagnostics / Procedures:     Radiology No results found.  Procedures Procedures (including critical care time) EKG  Pending results:  Labs Reviewed  HSV CULTURE AND TYPING    Medications Ordered in UC: Medications - No data to display  UC Diagnoses / Final Clinical Impressions(s)   I have reviewed the triage vital signs and the nursing notes.  Pertinent labs & imaging results that were available during my care of the patient were reviewed by me and considered  in my medical decision making (see chart for details).    Final diagnoses:  Genital lesion, female  History of cold sores   HSV testing ablation performed at  patient's request.  We will notify her of the results once received.  Will treat patient empirically for presumed genital HSV while she waits for her results.  Patient advised that if the test is positive she should finish 10 days of valacyclovir 1 g twice daily and if it is negative she can keep any leftover valacyclovir on hand and take it once every 12 hours x 2 doses for cold sores.  Please see discharge instructions below for details of plan of care as provided to patient. ED Prescriptions     Medication Sig Dispense Auth. Provider   valACYclovir (VALTREX) 1000 MG tablet Take 1 tablet (1,000 mg total) by mouth 2 (two) times daily for 10 days. 20 tablet Theadora Rama Scales, PA-C      PDMP not reviewed this encounter.  Disposition Upon Discharge:  Condition: stable for discharge home  Patient presented with concern for an acute illness with associated systemic symptoms and significant discomfort requiring urgent management. In my opinion, this is a condition that a prudent lay person (someone who possesses an average knowledge of health and medicine) may potentially expect to result in complications if not addressed urgently such as respiratory distress, impairment of bodily function or dysfunction of bodily organs.   As such, the patient has been evaluated and assessed, work-up was performed and treatment was provided in alignment with urgent care protocols and evidence based medicine.  Patient/parent/caregiver has been advised that the patient may require follow up for further testing and/or treatment if the symptoms continue in spite of treatment, as clinically indicated and appropriate.  Routine symptom specific, illness specific and/or disease specific instructions were discussed with the patient and/or caregiver at length.   Prevention strategies for avoiding STD exposure were also discussed.  The patient will follow up with their current PCP if and as advised. If the patient does not currently have a PCP we will assist them in obtaining one.   The patient may need specialty follow up if the symptoms continue, in spite of conservative treatment and management, for further workup, evaluation, consultation and treatment as clinically indicated and appropriate.  Patient/parent/caregiver verbalized understanding and agreement of plan as discussed.  All questions were addressed during visit.  Please see discharge instructions below for further details of plan.  Discharge Instructions:   Discharge Instructions      The result of your genital herpes swab should be available in the next 3 to 4 days.  Initially it would be posted to your MyChart and if it is positive, you will be contacted by phone with further instructions.  Because you have a history of cold sores in your mouth, I think is reasonable for me to send a prescription for valacyclovir to your pharmacy.  For now, I recommend that you take 1 tablet twice daily until you receive the result of your genital herpes swab.  If the swab is positive, please complete 10 days of treatment.  If the swab is negative, keep the valacyclovir on hand for when a cold sore appears.  For treatment of cold sores, you take 1 tablet of valacyclovir every 12 hours for 2 doses.  Thank you for visiting [facility] today.  We appreciate the opportunity to participate in your care.      This office note has been dictated using Teaching laboratory technician.  Unfortunately, this method of dictation can sometimes lead to typographical or grammatical errors.  I apologize for your inconvenience in advance if this occurs.  Please do not hesitate to reach out to me if clarification is needed.       Theadora Rama Scales, PA-C 11/13/22 1210    Theadora Rama  Scales, PA-C 11/13/22 1210

## 2022-11-13 NOTE — Discharge Instructions (Signed)
The result of your genital herpes swab should be available in the next 3 to 4 days.  Initially it would be posted to your MyChart and if it is positive, you will be contacted by phone with further instructions.  Because you have a history of cold sores in your mouth, I think is reasonable for me to send a prescription for valacyclovir to your pharmacy.  For now, I recommend that you take 1 tablet twice daily until you receive the result of your genital herpes swab.  If the swab is positive, please complete 10 days of treatment.  If the swab is negative, keep the valacyclovir on hand for when a cold sore appears.  For treatment of cold sores, you take 1 tablet of valacyclovir every 12 hours for 2 doses.  Thank you for visiting [facility] today.  We appreciate the opportunity to participate in your care.

## 2022-11-16 LAB — HSV CULTURE AND TYPING

## 2023-02-19 ENCOUNTER — Ambulatory Visit
Admission: RE | Admit: 2023-02-19 | Discharge: 2023-02-19 | Disposition: A | Discharge status: Home / Self Care | Payer: Self-pay | Source: Ambulatory Visit

## 2023-02-19 VITALS — BP 131/85 | HR 75 | Temp 98.8°F | Resp 16

## 2023-02-19 DIAGNOSIS — N760 Acute vaginitis: Secondary | ICD-10-CM

## 2023-02-19 DIAGNOSIS — B9689 Other specified bacterial agents as the cause of diseases classified elsewhere: Secondary | ICD-10-CM | POA: Insufficient documentation

## 2023-02-19 MED ORDER — FLUCONAZOLE 150 MG PO TABS: ORAL_TABLET | ORAL | 0 refills | Status: AC

## 2023-02-19 NOTE — ED Provider Notes (Signed)
[facility]    CSN: [id number] Arrival date & time: 02/19/23  1750      History   Chief Complaint Chief Complaint  Patient presents with  . Vaginal Itching    Yeast infection - Entered by patient    HPI [name] is a 33 y.o. female.   The history is provided by the patient.   Patient presents for complaints of vaginal itching and vaginal discharge that has been present for the past week.  She describes the discharge as "white and thick."  Denies fever, chills, abdominal pain, vaginal odor, dysuria, hematuria, urinary urgency, frequency, or hesitancy.  Patient states she is not concerned for STI or STD.  Past Medical History:  Diagnosis Date  . 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/12/2020   05/12/20 pt aware and needs colpo per ASCCP guidelines, has immediate risk of CIN 3+ of 4.4 %, will get colpo, before getting IUD placed   . Chronic abdominal pain   . Colitis   . Counseling for birth control, oral contraceptives 06/26/2014  . Medical history non-contributory   . Missed period 06/26/2014    Patient Active Problem List   Diagnosis Date Noted  . 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 Nail abnormality 05/06/2020  . Depression with anxiety 05/06/2020  . Bacteremia due to Escherichia coli 05/05/2019  . 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  . 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/2017  . Status post primary low transverse cesarean section 04/14/2017  . BV (bacterial vaginosis) 09/11/2016  . Abdominal pain 04/22/2014    Past Surgical History:  Procedure Laterality Date  . CESAREAN SECTION N/A 04/13/2017   Procedure: CESAREAN SECTION;  Surgeon: Peculiar Bing, MD;  Location: [facility] BIRTHING SUITES;  Service: Obstetrics;  Laterality: N/A;  . NO PAST SURGERIES      OB History     Gravida  1   Para  1   Term  1   Preterm  0   AB  0   Living  1      SAB  0   IAB  0   Ectopic  0   Multiple  0   Live Births  1             Home Medications    Prior to Admission medications   Medication Sig Start Date End Date Taking? Authorizing Provider  amphetamine-dextroamphetamine (ADDERALL XR) 10 MG 24 hr capsule Take 10 mg by mouth daily.   Yes [provider]  busPIRone (BUSPAR) 5 MG tablet Take 5 mg by mouth 3 (three) times daily.   Yes [provider]  sertraline (ZOLOFT) 25 MG tablet Take 25 mg by mouth daily. 02/06/23  Yes [provider]  cyclobenzaprine (FLEXERIL) 10 MG tablet Take 1 tablet (10 mg total) by mouth at bedtime. 02/10/22   Domenick Gong, MD  fluticasone (FLONASE) 50 MCG/ACT nasal spray Place 1 spray into both nostrils 2 (two) times daily. 02/23/22   Particia Nearing, PA-C  naproxen (NAPROSYN) 500 MG tablet Take 1 tablet (500 mg total) by mouth 2 (two) times daily. 02/10/22   Domenick Gong, MD  nitrofurantoin, macrocrystal-monohydrate, (MACROBID) 100 MG capsule Take 1 capsule (100 mg total) by mouth 2 (two) times daily. 11/09/22   Leath-Warren, Sadie Haber, NP  promethazine-dextromethorphan (PROMETHAZINE-DM) 6.25-15 MG/5ML syrup Take 5 mLs by mouth 4 (four) times daily as needed. 02/23/22   Particia Nearing, PA-C    Family History Family History  Problem Relation Age  of Onset  . Cancer Mother        cervical  . Alcohol abuse Mother   . Ovarian cysts Mother   . Other Father        fatty liver  . Cancer Maternal Grandmother        skin  . Dementia Maternal Grandmother   . Dementia Paternal Grandmother   . Diabetes Paternal Grandfather   . Dementia Paternal Grandfather   . Ovarian cysts Sister   . Ovarian cysts Sister   . Jaundice Daughter     Social History Social History   Tobacco Use  . Smoking status: Never  . Smokeless tobacco: Never  Vaping Use  . Vaping status: Never Used  Substance Use Topics  . Alcohol use: Yes    Comment: occ  . Drug use: No     Allergies   Latex, Tylenol [acetaminophen], and Mineral oil   Review of  Systems Review of Systems Per HPI  Physical Exam Triage Vital Signs ED Triage Vitals  Encounter Vitals Group     BP 02/19/23 1847 131/85     Systolic BP Percentile --      Diastolic BP Percentile --      Pulse Rate 02/19/23 1847 75     Resp 02/19/23 1847 16     Temp 02/19/23 1847 98.8 F (37.1 C)     Temp Source 02/19/23 1847 Oral     SpO2 02/19/23 1847 99 %     Weight --      Height --      Head Circumference --      Peak Flow --      Pain Score 02/19/23 1849 0     Pain Loc --      Pain Education --      Exclude from Growth Chart --    No data found.  Updated Vital Signs BP 131/85 (BP Location: Right Arm)   Pulse 75   Temp 98.8 F (37.1 C) (Oral)   Resp 16   LMP 02/12/2023 (Approximate)   SpO2 99%   Visual Acuity Right Eye Distance:   Left Eye Distance:   Bilateral Distance:    Right Eye Near:   Left Eye Near:    Bilateral Near:     Physical Exam Vitals and nursing note reviewed.  Constitutional:      Appearance: Normal appearance.  HENT:     Head: Normocephalic.  Eyes:     Pupils: Pupils are equal, round, and reactive to light.  Cardiovascular:     Rate and Rhythm: Normal rate and regular rhythm.     Pulses: Normal pulses.     Heart sounds: Normal heart sounds.  Pulmonary:     Effort: Pulmonary effort is normal.     Breath sounds: Normal breath sounds.  Abdominal:     General: Bowel sounds are normal.     Palpations: Abdomen is soft.  Genitourinary:    Comments: GU exam deferred, self swab performed  Musculoskeletal:     Cervical back: Normal range of motion.  Skin:    General: Skin is warm and dry.  Neurological:     General: No focal deficit present.     Mental Status: She is alert and oriented to person, place, and time.  Psychiatric:        Mood and Affect: Mood normal.        Behavior: Behavior normal.     UC Treatments / Results  Labs (all labs ordered  are listed, but only abnormal results are displayed) Labs Reviewed - No data  to display  EKG   Radiology No results found.  Procedures Procedures (including critical care time)  Medications Ordered in UC Medications - No data to display  Initial Impression / Assessment and Plan / UC Course  I have reviewed the triage vital signs and the nursing notes.  Pertinent labs & imaging results that were available during my care of the patient were reviewed by me and considered in my medical decision making (see chart for details).  On exam, lung sounds are clear throughout, room air sats at 99%.  Cytology swab is pending for BV and yeast.  In the interim, given patient's symptoms, will treat empirically with fluconazole 150 mg tablets.  Supportive care recommendations were provided and discussed with the patient to include refraining from sexual intercourse until symptoms improve, and considering use of boric acid vaginal suppositories to help reduce reoccurrence of BV and yeast.  Patient was in agreement with this plan of care and verbalizes understanding.  All questions were answered.  Patient stable for discharge.  *** Final Clinical Impressions(s) / UC Diagnoses   Final diagnoses:  None   Discharge Instructions   None    ED Prescriptions   None    PDMP not reviewed this encounter.

## 2023-02-19 NOTE — Discharge Instructions (Signed)
Cytology swab is pending.  Results should be available within the next 48 to 72 hours.  You also have access to the results via MyChart. Take medication as prescribed. Recommend eating yogurt to help reduce vaginal symptoms. If you are experiencing reoccurring BV or yeast, you may also try over-the-counter boric acid vaginal suppositories.  Insert 1 suppository vaginally at bedtime for 1 week, then repeat weekly thereafter. Follow-up with your gynecologist if your test results are abnormal, and symptoms continue to persist. Follow-up as needed.

## 2023-02-19 NOTE — ED Triage Notes (Signed)
White vaginal discharge, vaginal itching x 1 week.

## 2023-02-20 LAB — CERVICOVAGINAL ANCILLARY ONLY
Bacterial Vaginitis (gardnerella): NEGATIVE
Candida Glabrata: NEGATIVE
Candida Vaginitis: NEGATIVE
Comment: NEGATIVE
Comment: NEGATIVE
Comment: NEGATIVE

## 2023-08-18 IMAGING — CT CT MAXILLOFACIAL W/ CM
3 series · 16 of 47 positions shown, 19 images · IV contrast (Omnipaque)
Comparison: None.

CLINICAL DATA: Right facial pain, concern for nasal abscess

EXAM:
CT MAXILLOFACIAL WITH CONTRAST
TECHNIQUE: Multidetector CT imaging of the maxillofacial structures was
performed with intravenous contrast. Multiplanar CT image
reconstructions were also generated.
CONTRAST:  75mL OMNIPAQUE IOHEXOL 300 MG/ML  SOLN

[Series 2: max soft · axial · 0.41mm/px · z∈[+724,+884]mm · 10 of 94 slices shown, 13 images]
[im 7/94  brain]
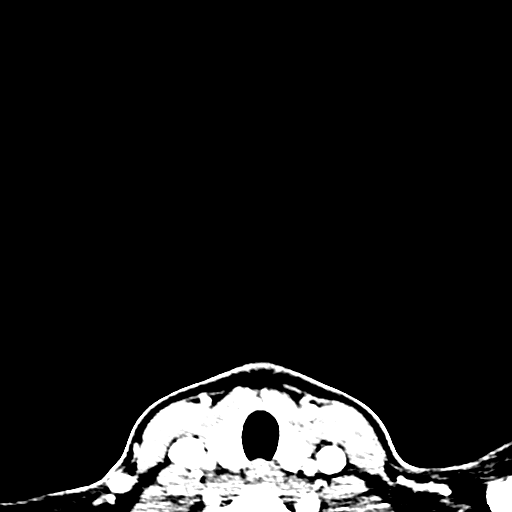
[im 7/94  bone]
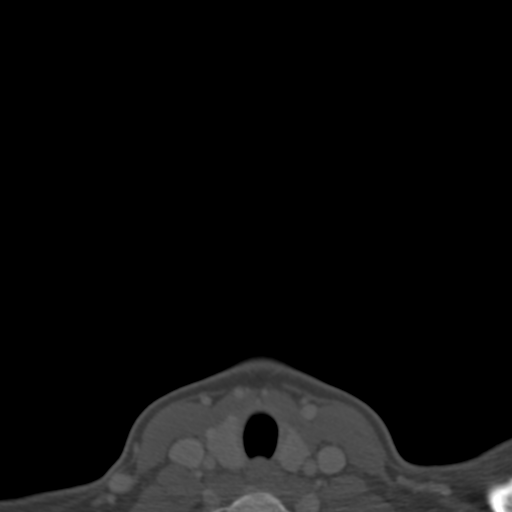
[im 17/94  bone]
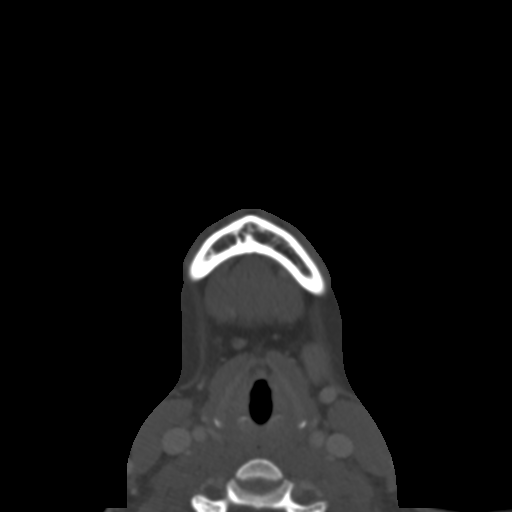
[im 26/94  bone]
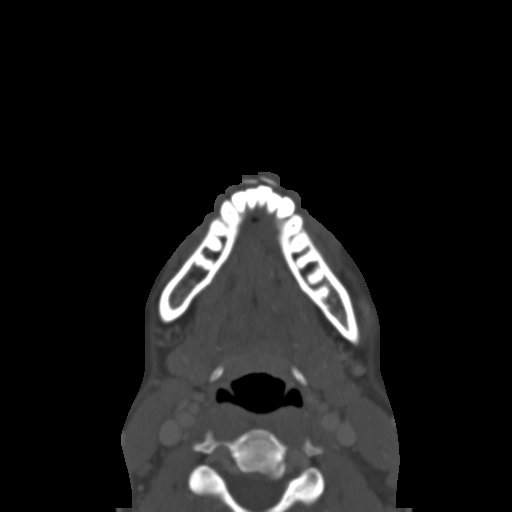
[im 33/94  bone]
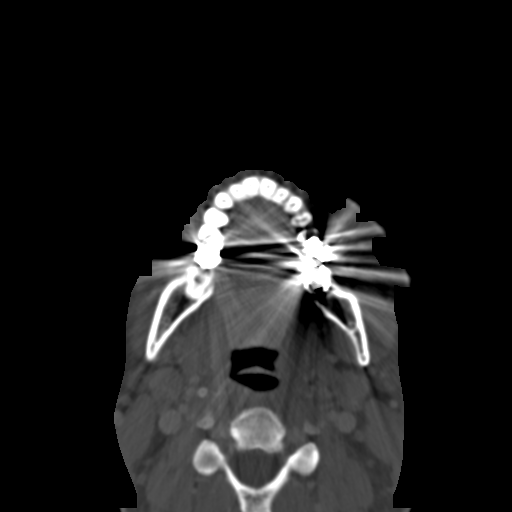
[im 42/94  brain]
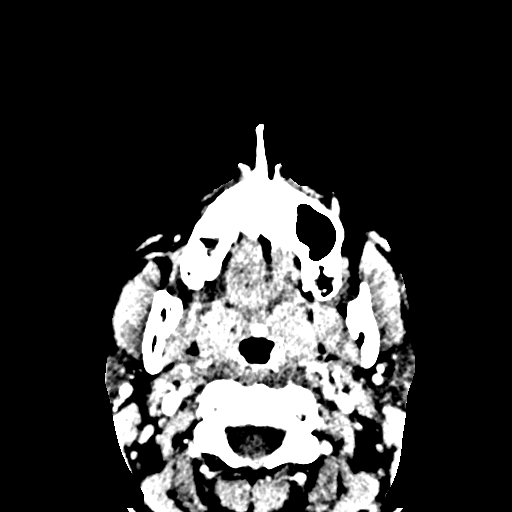
[im 42/94  bone]
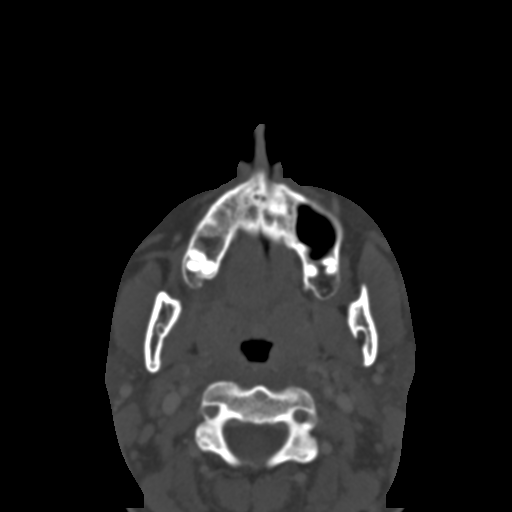
[im 52/94  bone]
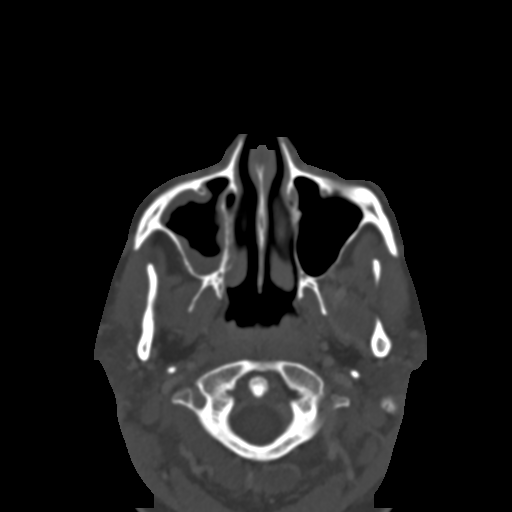
[im 61/94  bone]
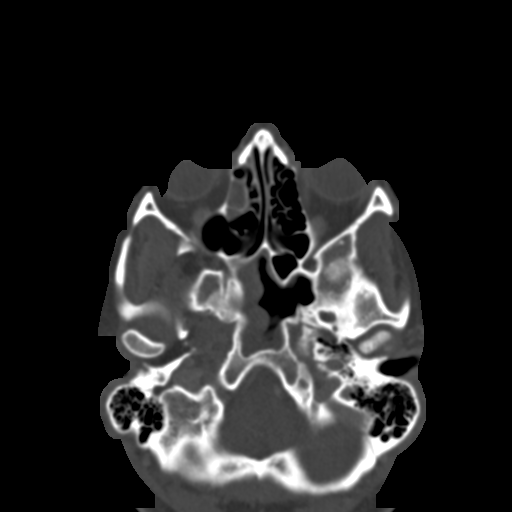
[im 71/94  bone]
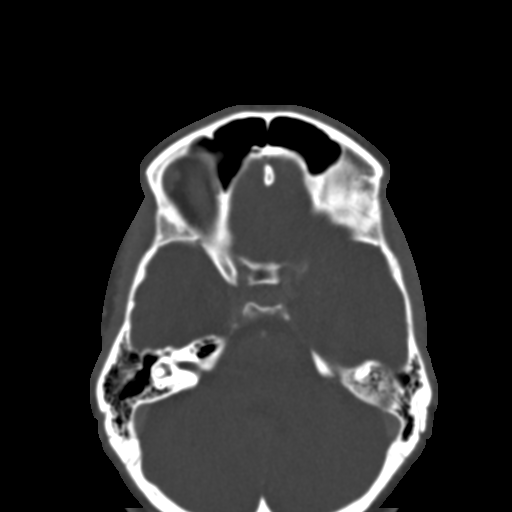
[im 77/94  brain]
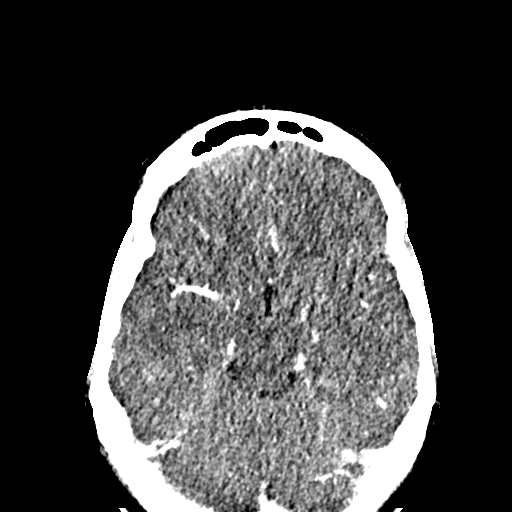
[im 77/94  bone]
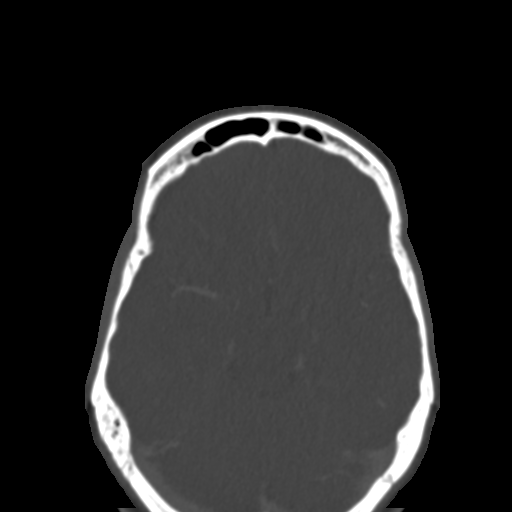
[im 87/94  bone]
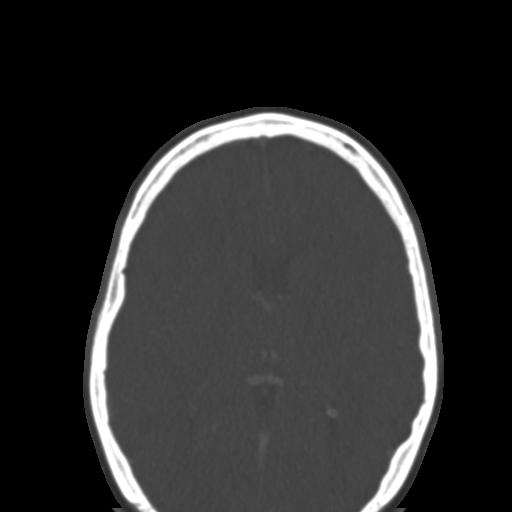

[Series 6: coronal soft · coronal · 0.43mm/px · 3 of 92 slices shown]
[im 31/92  bone]
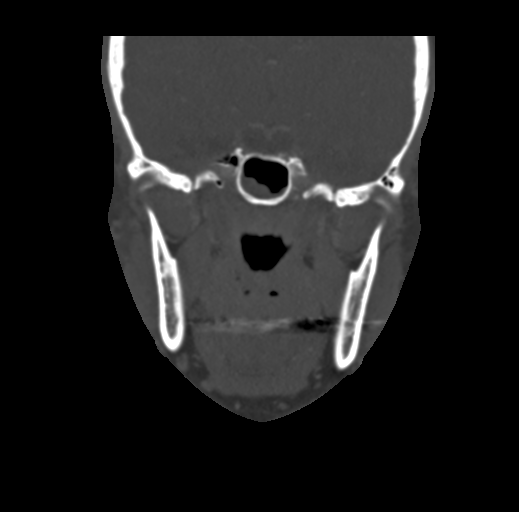
[im 41/92  bone]
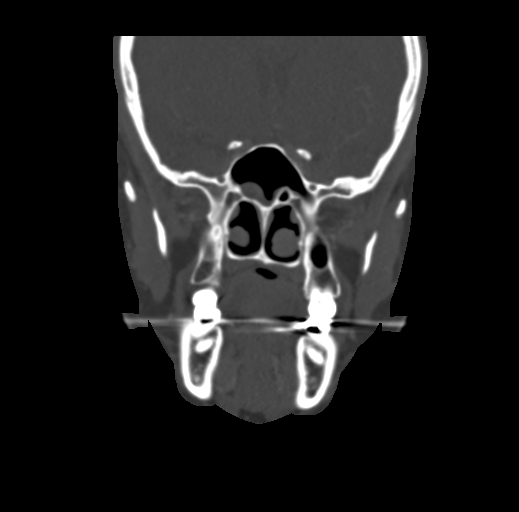
[im 51/92  bone]
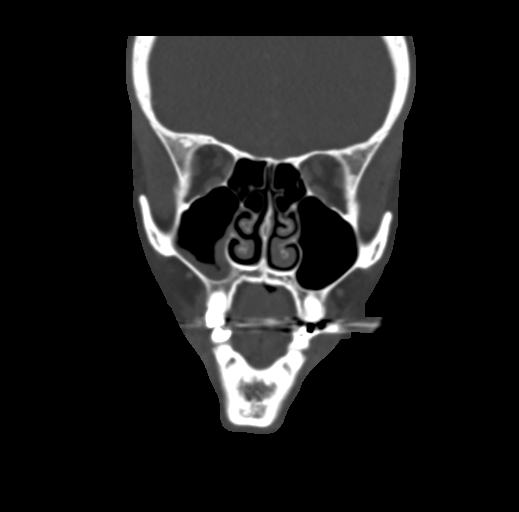

[Series 7: sagittal soft · sagittal · 0.40mm/px · 3 of 100 slices shown]
[im 34/100  bone]
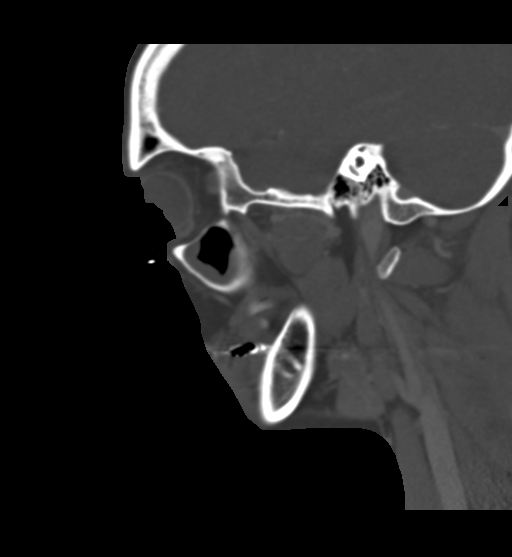
[im 50/100  bone]
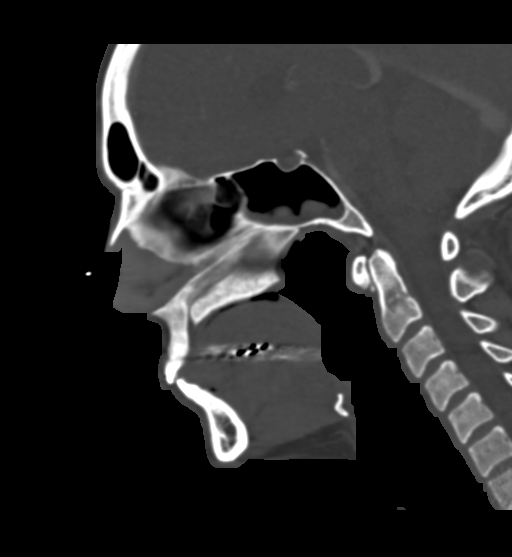
[im 67/100  bone]
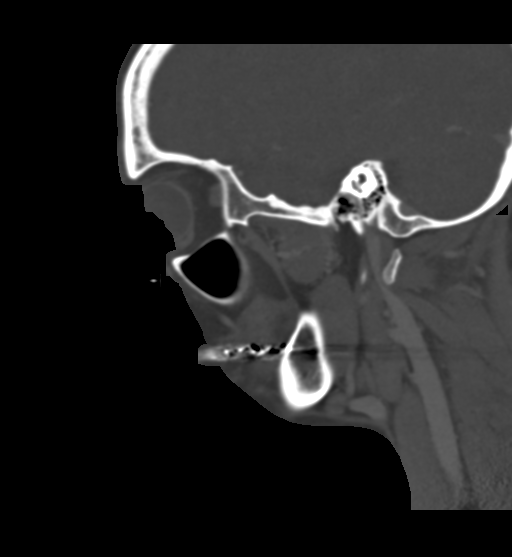

[16 of 47 positions shown; findings below may reference images not displayed]

FINDINGS: Osseous: No fracture or mandibular dislocation. No destructive
process.

Orbits: Negative. No traumatic or inflammatory finding.

Sinuses: Partial opacification of the right maxillary sinus, right
sphenoid sinus, and anterior right ethmoid air cells. The mastoids
are well aerated.

Soft tissues: Negative.

Limited intracranial: No significant or unexpected finding.
IMPRESSION: 1. No evidence of abscess.
2. Mucosal thickening in the right maxillary sinus, right sphenoid
sinus, and right anterior ethmoid air cells.

## 2023-09-15 ENCOUNTER — Ambulatory Visit
Admission: RE | Admit: 2023-09-15 | Discharge: 2023-09-15 | Disposition: A | Discharge status: Home / Self Care | Payer: Self-pay | Source: Ambulatory Visit

## 2023-09-15 ENCOUNTER — Other Ambulatory Visit: Payer: Self-pay

## 2023-09-15 VITALS — BP 120/80 | HR 89 | Temp 98.0°F | Resp 20

## 2023-09-15 DIAGNOSIS — J069 Acute upper respiratory infection, unspecified: Secondary | ICD-10-CM

## 2023-09-15 LAB — POC SOFIA SARS ANTIGEN FIA: SARS Coronavirus 2 Ag: NEGATIVE

## 2023-09-15 LAB — POCT RAPID STREP A (OFFICE): Rapid Strep A Screen: NEGATIVE

## 2023-09-15 MED ORDER — AZELASTINE HCL 0.1 % NA SOLN: 1.0000 | Freq: Two times a day (BID) | NASAL | 0 refills | Status: AC

## 2023-09-15 MED ORDER — PROMETHAZINE-DM 6.25-15 MG/5ML PO SYRP: 5.0000 mL | ORAL_SOLUTION | Freq: Four times a day (QID) | ORAL | 0 refills | Status: AC | PRN

## 2023-09-15 NOTE — ED Triage Notes (Addendum)
 Pt reports nasal congestion, sore throat, bilateral eye drainage and ear pain since Tuesday. Pt reports was exposed to strep and covid. Pt reports has been working/staying in moldy environments while traveling for work this week.Has started taking xyzal when symptoms started. Home covid test negative x2.

## 2023-09-15 NOTE — ED Provider Notes (Signed)
 [facility]    CSN: [id number] Arrival date & time: 09/15/23  0945      History   Chief Complaint Chief Complaint  Patient presents with   Nasal Congestion    Wanting to be tested for strep/ pink eye - Entered by patient    HPI [name] Brown is a 33 y.o. female.   Patient presenting today with 4 to 5-day history of nasal congestion, sore throat, bilateral clear eye drainage, ear pain and pressure, cough.  Denies chest pain, shortness of breath, abdominal pain, vomiting, diarrhea.  So far trying Xyzal with minimal relief.  Recent exposures to strep, pinkeye and COVID.    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   Nail abnormality 05/06/2020   Depression with anxiety 05/06/2020   Bacteremia due to Escherichia coli 05/05/2019   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/2017   Status post primary low transverse cesarean section 04/14/2017   BV (bacterial vaginosis) 09/11/2016   Abdominal pain 04/22/2014    Past Surgical History:  Procedure Laterality Date   CESAREAN SECTION N/A 04/13/2017   Procedure: CESAREAN SECTION;  Surgeon: Izell Harari, MD;  Location: [facility];  Service: Obstetrics;  Laterality: N/A;   NO PAST SURGERIES      OB History     Gravida  1   Para  1   Term  1   Preterm  0   AB  0   Living  1      SAB  0   IAB  0   Ectopic  0   Multiple  0   Live Births  1            Home Medications    Prior to Admission medications    Medication Sig Start Date End Date Taking? Authorizing Provider  azelastine  (ASTELIN ) 0.1 % nasal spray Place 1 spray into both nostrils 2 (two) times daily. Use in each nostril as directed 09/15/23  Yes Stuart Vernell Norris, PA-C  FLUoxetine (PROZAC) 10 MG capsule Take 10 mg by mouth daily. 09/01/23  Yes [provider]  promethazine -dextromethorphan (PROMETHAZINE -DM) 6.25-15 MG/5ML syrup Take 5 mLs by mouth 4 (four) times daily as needed. 09/15/23  Yes Stuart Vernell Norris, PA-C  amphetamine-dextroamphetamine (ADDERALL XR) 10 MG 24 hr capsule Take 10 mg by mouth daily.    [provider]  busPIRone (BUSPAR) 5 MG tablet Take 5 mg by mouth 3 (three) times daily.    [provider]  cyclobenzaprine  (FLEXERIL ) 10 MG tablet Take 1 tablet (10 mg total) by mouth at bedtime. 02/10/22   Van Knee, MD  fluconazole  (DIFLUCAN ) 150 MG tablet Take 1 tablet by mouth today.  May repeat every 72 hours up to 2 additional doses. 02/19/23   Leath-Warren, Etta PARAS, NP  fluticasone  (FLONASE ) 50 MCG/ACT nasal spray Place 1 spray into both nostrils 2 (two) times daily. 02/23/22   Stuart Vernell Norris, PA-C  naproxen  (NAPROSYN ) 500 MG tablet  Take 1 tablet (500 mg total) by mouth 2 (two) times daily. 02/10/22   Van Knee, MD  nitrofurantoin , macrocrystal-monohydrate, (MACROBID ) 100 MG capsule Take 1 capsule (100 mg total) by mouth 2 (two) times daily. 11/09/22   Leath-Warren, Etta PARAS, NP  promethazine -dextromethorphan (PROMETHAZINE -DM) 6.25-15 MG/5ML syrup Take 5 mLs by mouth 4 (four) times daily as needed. 02/23/22   Stuart Vernell Norris, PA-C  sertraline (ZOLOFT) 25 MG tablet Take 25 mg by mouth daily. 02/06/23   [provider]    Family History Family History  Problem Relation Age of Onset   Cancer Mother        cervical   Alcohol abuse Mother    Ovarian cysts Mother    Other Father        fatty liver   Cancer Maternal Grandmother        skin   Dementia  Maternal Grandmother    Dementia Paternal Grandmother    Diabetes Paternal Grandfather    Dementia Paternal Grandfather    Ovarian cysts Sister    Ovarian cysts Sister    Jaundice Daughter     Social History Social History   Tobacco Use   Smoking status: Never   Smokeless tobacco: Never  Vaping Use   Vaping status: Never Used  Substance Use Topics   Alcohol use: Yes    Comment: occ   Drug use: No     Allergies   Latex, Tylenol  [acetaminophen ], and Mineral oil   Review of Systems Review of Systems Per HPI  Physical Exam Triage Vital Signs ED Triage Vitals [09/15/23 1019]  Encounter Vitals Group     BP 120/80     Girls Systolic BP Percentile      Girls Diastolic BP Percentile      Boys Systolic BP Percentile      Boys Diastolic BP Percentile      Pulse Rate 89     Resp 20     Temp 98 F (36.7 C)     Temp Source Oral     SpO2 98 %     Weight      Height      Head Circumference      Peak Flow      Pain Score      Pain Loc      Pain Education      Exclude from Growth Chart    No data found.  Updated Vital Signs BP 120/80 (BP Location: Right Arm)   Pulse 89   Temp 98 F (36.7 C) (Oral)   Resp 20   LMP 09/08/2023   SpO2 98%   Visual Acuity Right Eye Distance:   Left Eye Distance:   Bilateral Distance:    Right Eye Near:   Left Eye Near:    Bilateral Near:     Physical Exam Vitals and nursing note reviewed.  Constitutional:      Appearance: Normal appearance.  HENT:     Head: Atraumatic.     Right Ear: Tympanic membrane and external ear normal.     Left Ear: Tympanic membrane and external ear normal.     Nose: Rhinorrhea present.     Mouth/Throat:     Mouth: Mucous membranes are moist.     Pharynx: Posterior oropharyngeal erythema present.  Eyes:     Extraocular Movements: Extraocular movements intact.     Conjunctiva/sclera: Conjunctivae normal.  Cardiovascular:     Rate and Rhythm: Normal rate and regular rhythm.  Heart sounds:  Normal heart sounds.  Pulmonary:     Effort: Pulmonary effort is normal.     Breath sounds: Normal breath sounds. No wheezing or rales.  Musculoskeletal:        General: Normal range of motion.     Cervical back: Normal range of motion and neck supple.  Skin:    General: Skin is warm and dry.  Neurological:     Mental Status: She is alert and oriented to person, place, and time.  Psychiatric:        Mood and Affect: Mood normal.        Thought Content: Thought content normal.      UC Treatments / Results  Labs (all labs ordered are listed, but only abnormal results are displayed) Labs Reviewed  POC SOFIA SARS ANTIGEN FIA  POCT RAPID STREP A (OFFICE)    EKG   Radiology No results found.  Procedures Procedures (including critical care time)  Medications Ordered in UC Medications - No data to display  Initial Impression / Assessment and Plan / UC Course  I have reviewed the triage vital signs and the nursing notes.  Pertinent labs & imaging results that were available during my care of the patient were reviewed by me and considered in my medical decision making (see chart for details).     Vital signs and exam very reassuring and suggestive of a viral respiratory infection.  Rapid strep and COVID-negative, will treat with Phenergan  DM, Astelin , supportive over-the-counter medications and home care.  Return for worsening symptoms.  Final Clinical Impressions(s) / UC Diagnoses   Final diagnoses:  Viral URI with cough   Discharge Instructions   None    ED Prescriptions     Medication Sig Dispense Auth. Provider   promethazine -dextromethorphan (PROMETHAZINE -DM) 6.25-15 MG/5ML syrup Take 5 mLs by mouth 4 (four) times daily as needed. 100 mL Stuart Vernell Norris, PA-C   azelastine  (ASTELIN ) 0.1 % nasal spray Place 1 spray into both nostrils 2 (two) times daily. Use in each nostril as directed 30 mL Stuart Vernell Norris, PA-C      PDMP not reviewed this  encounter.   Stuart Vernell Norris, [place] 09/15/23 1106
